# Patient Record
Sex: Female | Born: 1975 | Race: Black or African American | Hispanic: No | Marital: Single | Smoking: Current every day smoker
Health system: Southern US, Community
[De-identification: ages and names within clinical notes are randomized; demographics above are authoritative.]

## PROBLEM LIST (undated history)

## (undated) DIAGNOSIS — E7800 Pure hypercholesterolemia, unspecified: Secondary | ICD-10-CM

## (undated) DIAGNOSIS — IMO0002 Reserved for concepts with insufficient information to code with codable children: Secondary | ICD-10-CM

## (undated) DIAGNOSIS — E119 Type 2 diabetes mellitus without complications: Secondary | ICD-10-CM

## (undated) DIAGNOSIS — N8501 Benign endometrial hyperplasia: Secondary | ICD-10-CM

## (undated) DIAGNOSIS — N939 Abnormal uterine and vaginal bleeding, unspecified: Secondary | ICD-10-CM

## (undated) HISTORY — DX: Pure hypercholesterolemia, unspecified: E78.00

## (undated) HISTORY — DX: Type 2 diabetes mellitus without complications: E11.9

## (undated) HISTORY — DX: Benign endometrial hyperplasia: N85.01

## (undated) HISTORY — PX: APPENDECTOMY: SHX54

## (undated) HISTORY — DX: Reserved for concepts with insufficient information to code with codable children: IMO0002

## (undated) HISTORY — DX: Abnormal uterine and vaginal bleeding, unspecified: N93.9

## (undated) HISTORY — DX: Morbid (severe) obesity due to excess calories: E66.01

---
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
## 2002-09-22 ENCOUNTER — Emergency Department (HOSPITAL_COMMUNITY): Admission: EM | Admit: 2002-09-22 | Discharge: 2002-09-22 | Payer: Self-pay | Admitting: *Deleted

## 2002-09-22 ENCOUNTER — Encounter: Payer: Self-pay | Admitting: *Deleted

## 2005-07-05 ENCOUNTER — Ambulatory Visit (HOSPITAL_COMMUNITY): Admission: RE | Admit: 2005-07-05 | Discharge: 2005-07-05 | Payer: Self-pay | Admitting: *Deleted

## 2006-01-13 ENCOUNTER — Emergency Department (HOSPITAL_COMMUNITY): Admission: EM | Admit: 2006-01-13 | Discharge: 2006-01-14 | Payer: Self-pay | Admitting: Emergency Medicine

## 2008-09-03 HISTORY — PX: OTHER SURGICAL HISTORY: SHX169

## 2009-06-19 ENCOUNTER — Emergency Department (HOSPITAL_COMMUNITY): Admission: EM | Admit: 2009-06-19 | Discharge: 2009-06-20 | Payer: Self-pay | Admitting: Emergency Medicine

## 2010-09-26 ENCOUNTER — Encounter: Payer: Self-pay | Admitting: Obstetrics & Gynecology

## 2010-09-26 ENCOUNTER — Ambulatory Visit
Admission: RE | Admit: 2010-09-26 | Discharge: 2010-09-26 | Payer: Self-pay | Source: Home / Self Care | Attending: Obstetrics & Gynecology | Admitting: Obstetrics & Gynecology

## 2010-09-26 DIAGNOSIS — R87619 Unspecified abnormal cytological findings in specimens from cervix uteri: Secondary | ICD-10-CM

## 2010-09-26 DIAGNOSIS — IMO0002 Reserved for concepts with insufficient information to code with codable children: Secondary | ICD-10-CM

## 2010-09-26 HISTORY — DX: Reserved for concepts with insufficient information to code with codable children: IMO0002

## 2010-09-26 HISTORY — DX: Unspecified abnormal cytological findings in specimens from cervix uteri: R87.619

## 2010-09-26 LAB — CONVERTED CEMR LAB
HCT: 41.4 % (ref 36.0–46.0)
Hemoglobin: 13.5 g/dL (ref 12.0–15.0)
Hgb A1c MFr Bld: 6.3 % — ABNORMAL HIGH (ref <5.7)
MCHC: 32.6 g/dL (ref 30.0–36.0)
MCV: 81.5 fL (ref 78.0–100.0)
Platelets: 331 10*3/uL (ref 150–400)
Prolactin: 3.9 ng/mL
RBC: 5.08 M/uL (ref 3.87–5.11)
RDW: 13.4 % (ref 11.5–15.5)
TSH: 2.6 microintl units/mL (ref 0.350–4.500)
Testosterone: 55.36 ng/dL (ref 10–70)
WBC: 8 10*3/uL (ref 4.0–10.5)
hCG, Beta Chain, Quant, S: <2 milliintl units/mL

## 2010-09-27 NOTE — Assessment & Plan Note (Signed)
NAME:  Mariah [name], Mariah [name]            ACCOUNT NO.:  [id number]  MEDICAL RECORD NO.:  [id number]          PATIENT TYPE:  POB  LOCATION:  [facility] at [facility]         FACILITY:  [facility]  PHYSICIAN:  Jaynie Collins, MD     DATE OF BIRTH:  1976/01/29  DATE OF SERVICE:                                 CLINIC NOTE  REASON FOR VISIT:  Annual physical examination.  Mariah [name] is a 35 year old, gravida 0 who is here to establish gynecologic care and for annual examination.  The patient complains of irregular cycles and having spotting for the last few months.  MENSTRUAL HISTORY:  The patient has a menarche at age 35.  She does report that she was initially regular, but for many years she has had oligomenorrhea and actually was on oral contraceptive pills for less than a year.  Over the past few months, she has had a spotting all the time and she is really concerned about this.  She denies any associated abdominal pain, but does report some malodorous vaginal discharge which she attributes to having the continuous spotting.  She denies any other gynecologic symptoms.  PAST OBSTETRIC AND GYNECOLOGIC HISTORY:  The patient is a nulligravida. Her menstrual history is as above.  The patient uses condoms for contraception.  She has had normal Pap smear, the last one in 2010.  She denies any sexually transmitted diseases or any other gynecologic indications.  PAST MEDICAL HISTORY:  Morbid obesity and hypercholesterinemia.  PAST SURGICAL HISTORY:  Appendectomy, cystectomy in 1989 at [facility] and polyp removal in 2010, by Dr. Emelda Fear at [facility].  MEDICATIONS:  None.  ALLERGIES:  No known drug allergies.  SOCIAL HISTORY:  The patient lives with her family.  She smokes one-pack a day and has smoked for 13 years.  She denies any illicit drugs or alcohol use.  Denies any current or past history of sexual or physical abuse.  FAMILY HISTORY:  Remarkable for her grandmother who was diagnosed  with breast cancer before the age of 44.  No other cancer history in the family.  She does have an extensive family history of diabetes, heart disease and heart attacks.  Her mom and grandfather both had heart attacks.  REVIEW OF SYSTEMS:  The patient reports weakness in fingers and numbness, vaginal odor, vaginal itching, vaginal bleeding and pain with intercourse.  PHYSICAL EXAMINATION:  VITAL SIGNS:  Pulse is 92, blood pressure 126/101, rechecked it is 102/83, weight 290 pounds, height 5 feet 7 inches. GENERAL:  No apparent distress.  The patient is noticeably hirsute with hair all over her face and under her chin. HEENT:  Normocephalic, atraumatic. NECK:  Supple.  Normal thyroid. LUNGS:  Clear to auscultation bilaterally. HEART:  Regular rate and rhythm. BREASTS:  Symmetric in size, pendulous, soft, nontender, no abnormal masses, skin changes, nipple drainage or lymphadenopathy noted. ABDOMEN:  Obese.  No organomegaly palpated. EXTREMITIES:  No cyanosis, clubbing or edema.  Nontender. PELVIC:  Normal external female genitalia.  Pink and well rugated vagina.  The patient was unable to tolerate a speculum exam with a graves speculum and long Richardson Chiquito was used.  She does have a nulliparous cervix, some blood was seen in the vaginal vault.  No active bleeding. Pap smear sample was obtained.  On bimanual exam, the uterus or adnexa could not be palpated secondary to patient's habitus.  The patient was very uncomfortable during the pelvic examination.  ASSESSMENT AND PLAN:  The patient is a 35 year old, gravida 0 who is here for annual examination and evaluation of irregular menses.  She had a normal breast examination.  Pap smear is pending.  We will follow up with her results.  The patient is interested in sexually transmitted infection screening and so ancillary testing will be done from her Pap smear, but for a part of her sexually transmitted infection and also workup of  possible vaginitis and she will also have serum testing for human immunodeficiency virus, hepatitis B, hepatitis C and syphilis.  As for her irregular menses, different etiologies were discussed in detail with the patient including hormonal dysfunction, structural anomalies of her reproductive organ and also thickened endometrial stripe versus possible hyperplasia given the patient's obesity and increased estrone. As such, she will have laboratory evaluation in the form of CBC, TSH, prolactin, total testosterone, hemoglobin A1c and hCG, has one of the hormonal etiologies given her history of being hirsute can be that she has a polycystic ovarian syndrome diagnosis.  The patient is also going to have a pelvic ultrasound for further evaluation of her endometrial stripe and to rule out structural anomalies.  We will follow up on these results.  The patient is interested in being referred from primary care physician followup and so she will be referred after this encounter because of the concern about endometrial hyperplasia, especially in the setting of morbid obesity and also concerned about possible neoplasia. The patient was counseled regarding an endometrial biopsy.  She will get this done when she comes for her next visit and the patient was instructed to take ibuprofen before that biopsy to help with her discomfort during the procedure.  The patient was given written information about abnormal uterine bleeding and its management and we will follow up on all these tests and manage accordingly.  Bleeding precautions were reviewed and she was told to call or come back in if she has any further gynecologic concerns.          ______________________________ Jaynie Collins, MD    UA/MEDQ  D:  09/26/2010  T:  09/27/2010  Job:  [id number]

## 2010-10-17 ENCOUNTER — Ambulatory Visit: Payer: Self-pay | Admitting: Obstetrics and Gynecology

## 2010-10-18 ENCOUNTER — Ambulatory Visit: Payer: BC Managed Care – PPO | Admitting: Obstetrics & Gynecology

## 2010-12-28 ENCOUNTER — Emergency Department (HOSPITAL_COMMUNITY)
Admission: EM | Admit: 2010-12-28 | Discharge: 2010-12-28 | Disposition: A | Discharge status: Home / Self Care | Payer: BC Managed Care – PPO | Attending: Emergency Medicine | Admitting: Emergency Medicine

## 2010-12-28 DIAGNOSIS — R05 Cough: Secondary | ICD-10-CM | POA: Insufficient documentation

## 2010-12-28 DIAGNOSIS — R059 Cough, unspecified: Secondary | ICD-10-CM | POA: Insufficient documentation

## 2010-12-28 DIAGNOSIS — IMO0001 Reserved for inherently not codable concepts without codable children: Secondary | ICD-10-CM | POA: Insufficient documentation

## 2011-01-31 ENCOUNTER — Other Ambulatory Visit: Payer: Self-pay | Admitting: Obstetrics & Gynecology

## 2011-01-31 DIAGNOSIS — N926 Irregular menstruation, unspecified: Secondary | ICD-10-CM

## 2011-02-08 ENCOUNTER — Ambulatory Visit (HOSPITAL_COMMUNITY)
Admission: RE | Admit: 2011-02-08 | Discharge: 2011-02-08 | Disposition: A | Discharge status: Home / Self Care | Payer: BC Managed Care – PPO | Source: Ambulatory Visit | Attending: Obstetrics & Gynecology | Admitting: Obstetrics & Gynecology

## 2011-02-08 ENCOUNTER — Emergency Department (HOSPITAL_COMMUNITY)
Admission: EM | Admit: 2011-02-08 | Discharge: 2011-02-09 | Disposition: A | Discharge status: Home / Self Care | Payer: BC Managed Care – PPO | Attending: Emergency Medicine | Admitting: Emergency Medicine

## 2011-02-08 ENCOUNTER — Other Ambulatory Visit: Payer: Self-pay | Admitting: Obstetrics & Gynecology

## 2011-02-08 DIAGNOSIS — R05 Cough: Secondary | ICD-10-CM | POA: Insufficient documentation

## 2011-02-08 DIAGNOSIS — N926 Irregular menstruation, unspecified: Secondary | ICD-10-CM

## 2011-02-08 DIAGNOSIS — R059 Cough, unspecified: Secondary | ICD-10-CM | POA: Insufficient documentation

## 2011-02-08 DIAGNOSIS — N938 Other specified abnormal uterine and vaginal bleeding: Secondary | ICD-10-CM | POA: Insufficient documentation

## 2011-02-08 DIAGNOSIS — J45909 Unspecified asthma, uncomplicated: Secondary | ICD-10-CM | POA: Insufficient documentation

## 2011-02-08 DIAGNOSIS — N949 Unspecified condition associated with female genital organs and menstrual cycle: Secondary | ICD-10-CM | POA: Insufficient documentation

## 2011-02-09 ENCOUNTER — Emergency Department (HOSPITAL_COMMUNITY): Payer: BC Managed Care – PPO

## 2011-02-13 ENCOUNTER — Other Ambulatory Visit (INDEPENDENT_AMBULATORY_CARE_PROVIDER_SITE_OTHER): Payer: BC Managed Care – PPO | Admitting: Family Medicine

## 2011-02-13 DIAGNOSIS — N920 Excessive and frequent menstruation with regular cycle: Secondary | ICD-10-CM

## 2011-02-13 HISTORY — PX: ENDOMETRIAL BIOPSY: SHX622

## 2011-02-14 NOTE — Assessment & Plan Note (Signed)
NAME:  Mariah [name], Mariah [name] NO.:  [id number]  MEDICAL RECORD NO.:  [id number]           PATIENT TYPE:  LOCATION:  [facility] at [facility]           FACILITY:  PHYSICIAN:  Tinnie Gens, MD        DATE OF BIRTH:  March 13, 1976  DATE OF SERVICE:  02/13/2011                                 CLINIC NOTE  CHIEF COMPLAINT:  Followup.  HISTORY OF PRESENT ILLNESS:  The patient is a 35 year old, nulligravida, who was seen by Dr. Jaynie Collins on September 26, 2010, who was come back in for endometrial sampling for irregular bleeding and heavy periods.  She was also sent for pelvic sonogram, which is reviewed today.  Her ultrasound shows her to have a normal-sized uterus at 7.8 x 3.8 x 4.7 with an endometrial stripe that this is upper limits of normal containing cystic areas.  The right and left ovary were both normal.  It was felt that these could likely represent a polyp and sonohysterogram was recommended.  Additionally, the patient does desire further fertility.  Although, she is not showing any pregnant right now.  PHYSICAL EXAMINATION:  VITAL SIGNS:  Today, vitals are as noted in the chart. GENERAL:  She is a morbidly obese female.  Weight 294. ABDOMEN:  Soft, nontender, nondistended.  PROCEDURE:  Speculum was placed inside the vagina.  The cervix was visualized.  Cleaned with Betadine x2.  Grasped anteriorly with single- tooth tenaculum.  Sounds to approximately 7 cm.  Endometrial sampling is done without difficulty and the Pipelle was passed x2.  The patient tolerated the procedure well.  IMPRESSION:  Abnormal uterine bleeding in a morbidly obese female with increased risk of hyperplasia and abnormal ultrasound.  PLAN:  Follow-up biopsy results.  We will call the patient with these and they come in which should be in about 2 weeks.  Additionally, I have given her Provera 10 mg daily for 5 days once monthly to improve cycle control, so I think she is a good candidate for OCs  as she is almost 36 and smoker.          ______________________________ Tinnie Gens, MD    TP/MEDQ  D:  02/13/2011  T:  02/14/2011  Job:  [id number]

## 2011-03-15 ENCOUNTER — Encounter: Payer: Self-pay | Admitting: Obstetrics & Gynecology

## 2011-03-15 ENCOUNTER — Ambulatory Visit (INDEPENDENT_AMBULATORY_CARE_PROVIDER_SITE_OTHER): Payer: BC Managed Care – PPO | Admitting: Obstetrics & Gynecology

## 2011-03-15 VITALS — BP 106/67 | HR 86 | Ht 67.0 in | Wt 280.0 lb

## 2011-03-15 DIAGNOSIS — N926 Irregular menstruation, unspecified: Secondary | ICD-10-CM

## 2011-03-15 DIAGNOSIS — N939 Abnormal uterine and vaginal bleeding, unspecified: Secondary | ICD-10-CM

## 2011-03-15 DIAGNOSIS — Z8742 Personal history of other diseases of the female genital tract: Secondary | ICD-10-CM | POA: Insufficient documentation

## 2011-03-15 DIAGNOSIS — N8501 Benign endometrial hyperplasia: Secondary | ICD-10-CM

## 2011-03-15 MED ORDER — MEDROXYPROGESTERONE ACETATE 10 MG PO TABS: 10.0000 mg | ORAL_TABLET | Freq: Every day | ORAL | Status: DC

## 2011-03-15 NOTE — Progress Notes (Signed)
  Subjective:     Mariah [name] is an 35 y.o. G0  woman who presents for irregular menses. She was seen by Dr. Shawnie Pons on 02/13/11 and underwent an endometrial biopsy that showed simple hyperplasia without atypia.  She is here today to review results.   Objective:    BP 106/67  Pulse 86  Ht 5\' 7"  (1.702 m)  Wt 280 lb (127.007 kg)  BMI 43.85 kg/m2  LMP 02/10/2011 Exam deferred this visit.    Assessment and Plan:  Results reviewed with patient.  Recommended D&C for more complete endometrial sampling and hysteroscopy. Risks of surgery including bleeding, infection, injury to uterus or surrounding organs, need for additional procedures, possibility of intrauterine scarring which may impair future fertility and other postoperative/anesthesia complications were explained to patient.  She was told to expect contact from surgical scheduler regarding time and date of surgery.  She was also given an prescription for Provera 10 mg po daily for presumptive treatment of simple endometrial hyperplasia.  Preoperative handout explaining details about the D&C was given to the patient.

## 2011-04-10 ENCOUNTER — Encounter (HOSPITAL_COMMUNITY): Payer: Self-pay

## 2011-04-10 ENCOUNTER — Encounter (HOSPITAL_COMMUNITY)
Admission: RE | Admit: 2011-04-10 | Discharge: 2011-04-10 | Disposition: A | Discharge status: Home / Self Care | Payer: BC Managed Care – PPO | Source: Ambulatory Visit | Attending: Obstetrics & Gynecology | Admitting: Obstetrics & Gynecology

## 2011-04-10 LAB — CBC
MCH: 26.8 pg (ref 26.0–34.0)
MCV: 81.9 fL (ref 78.0–100.0)
Platelets: 272 10*3/uL (ref 150–400)
RDW: 13.9 % (ref 11.5–15.5)

## 2011-04-10 NOTE — Patient Instructions (Addendum)
21 Mariah [name]  04/10/2011   Your procedure is scheduled on:  04/16/11 Report to [facility] at 730 AM.  Call this number if you have problems the morning of surgery: [phone]   Remember:   Do not eat food:After Midnight.  Do not drink clear liquids: After Midnight.  Take these medicines the morning of surgery with A SIP OF WATER: NA   Do not wear jewelry, make-up or nail polish.  Do not wear lotions, powders, or perfumes. You may wear deodorant.  Do not shave 48 hours prior to surgery.  Do not bring valuables to the hospital.  Contacts, dentures or bridgework may not be worn into surgery.  Leave suitcase in the car. After surgery it may be brought to your room.  For patients admitted to the hospital, checkout time is 11:00 AM the day of discharge.   Patients discharged the day of surgery will not be allowed to drive home.  Name and phone number of your driver: mother   Mariah [name]   [phone]  Special Instructions: use CHG wash per written instruction sheet   Please read over the following fact sheets that you were given: NA

## 2011-04-16 ENCOUNTER — Encounter (HOSPITAL_COMMUNITY): Payer: Self-pay | Admitting: Registered Nurse

## 2011-04-16 ENCOUNTER — Other Ambulatory Visit: Payer: Self-pay | Admitting: Obstetrics & Gynecology

## 2011-04-16 ENCOUNTER — Encounter (HOSPITAL_COMMUNITY)
Admission: RE | Disposition: A | Discharge status: Home / Self Care | Payer: Self-pay | Source: Ambulatory Visit | Attending: Obstetrics & Gynecology

## 2011-04-16 ENCOUNTER — Ambulatory Visit (HOSPITAL_COMMUNITY): Payer: BC Managed Care – PPO | Admitting: Registered Nurse

## 2011-04-16 ENCOUNTER — Encounter (HOSPITAL_COMMUNITY): Payer: Self-pay | Admitting: Obstetrics & Gynecology

## 2011-04-16 ENCOUNTER — Ambulatory Visit (HOSPITAL_COMMUNITY)
Admission: RE | Admit: 2011-04-16 | Discharge: 2011-04-16 | Disposition: A | Discharge status: Home / Self Care | Payer: BC Managed Care – PPO | Source: Ambulatory Visit | Attending: Obstetrics & Gynecology | Admitting: Obstetrics & Gynecology

## 2011-04-16 DIAGNOSIS — N915 Oligomenorrhea, unspecified: Secondary | ICD-10-CM | POA: Insufficient documentation

## 2011-04-16 DIAGNOSIS — Z01812 Encounter for preprocedural laboratory examination: Secondary | ICD-10-CM | POA: Insufficient documentation

## 2011-04-16 DIAGNOSIS — N8501 Benign endometrial hyperplasia: Secondary | ICD-10-CM

## 2011-04-16 DIAGNOSIS — Z01818 Encounter for other preprocedural examination: Secondary | ICD-10-CM | POA: Insufficient documentation

## 2011-04-16 HISTORY — PX: HYSTEROSCOPY WITH D & C: SHX1775

## 2011-04-16 SURGERY — DILATATION AND CURETTAGE /HYSTEROSCOPY
Anesthesia: Choice

## 2011-04-16 MED ORDER — MIDAZOLAM HCL 5 MG/5ML IJ SOLN
INTRAMUSCULAR | Status: DC | PRN
  Administered 2011-04-16: 2 mg via INTRAVENOUS

## 2011-04-16 MED ORDER — MIDAZOLAM HCL 2 MG/2ML IJ SOLN
INTRAMUSCULAR | Status: AC
  Filled 2011-04-16: qty 2

## 2011-04-16 MED ORDER — FAMOTIDINE 20 MG PO TABS: 20.0000 mg | ORAL_TABLET | Freq: Once | ORAL | Status: DC | PRN

## 2011-04-16 MED ORDER — PANTOPRAZOLE SODIUM 40 MG PO TBEC: 40.0000 mg | DELAYED_RELEASE_TABLET | Freq: Once | ORAL | Status: DC | PRN

## 2011-04-16 MED ORDER — DOCUSATE SODIUM 100 MG PO CAPS: 100.0000 mg | ORAL_CAPSULE | Freq: Two times a day (BID) | ORAL | Status: AC | PRN

## 2011-04-16 MED ORDER — BUPIVACAINE HCL (PF) 0.25 % IJ SOLN
INTRAMUSCULAR | Status: DC | PRN
  Administered 2011-04-16: 20 mL

## 2011-04-16 MED ORDER — ONDANSETRON HCL 4 MG/2ML IJ SOLN
INTRAMUSCULAR | Status: DC | PRN
  Administered 2011-04-16: 4 mg via INTRAVENOUS

## 2011-04-16 MED ORDER — IBUPROFEN 600 MG PO TABS: 600.0000 mg | ORAL_TABLET | Freq: Four times a day (QID) | ORAL | Status: AC | PRN

## 2011-04-16 MED ORDER — METOCLOPRAMIDE HCL 10 MG PO TABS: 10.0000 mg | ORAL_TABLET | Freq: Once | ORAL | Status: DC | PRN

## 2011-04-16 MED ORDER — GLYCINE 1.5 % IR SOLN
Status: DC | PRN
  Administered 2011-04-16: 3000 mL

## 2011-04-16 MED ORDER — CITRIC ACID-SODIUM CITRATE 334-500 MG/5ML PO SOLN: 30.0000 mL | Freq: Once | ORAL | Status: DC | PRN

## 2011-04-16 MED ORDER — OXYCODONE-ACETAMINOPHEN 5-325 MG PO TABS: 1.0000 | ORAL_TABLET | ORAL | Status: AC | PRN

## 2011-04-16 MED ORDER — SCOPOLAMINE 1 MG/3DAYS TD PT72: 1.0000 | MEDICATED_PATCH | Freq: Once | TRANSDERMAL | Status: DC | PRN

## 2011-04-16 MED ORDER — FENTANYL CITRATE 0.05 MG/ML IJ SOLN
INTRAMUSCULAR | Status: DC | PRN
  Administered 2011-04-16: 100 ug via INTRAVENOUS

## 2011-04-16 MED ORDER — LACTATED RINGERS IV SOLN
INTRAVENOUS | Status: DC | PRN
  Administered 2011-04-16: 09:00:00 via INTRAVENOUS

## 2011-04-16 MED ORDER — FENTANYL CITRATE 0.05 MG/ML IJ SOLN
INTRAMUSCULAR | Status: AC
  Filled 2011-04-16: qty 2

## 2011-04-16 MED ORDER — KETOROLAC TROMETHAMINE 30 MG/ML IJ SOLN: 15.0000 mg | Freq: Once | INTRAMUSCULAR | Status: DC | PRN

## 2011-04-16 MED ORDER — LIDOCAINE HCL (CARDIAC) 20 MG/ML IV SOLN
INTRAVENOUS | Status: AC
  Filled 2011-04-16: qty 5

## 2011-04-16 MED ORDER — LACTATED RINGERS IV SOLN
INTRAVENOUS | Status: DC
  Administered 2011-04-16: 20 mL/h via INTRAVENOUS

## 2011-04-16 MED ORDER — PROMETHAZINE HCL 25 MG/ML IJ SOLN: 6.2500 mg | INTRAMUSCULAR | Status: DC | PRN

## 2011-04-16 MED ORDER — PROPOFOL 10 MG/ML IV EMUL
INTRAVENOUS | Status: DC | PRN
  Administered 2011-04-16: 300 mg via INTRAVENOUS

## 2011-04-16 MED ORDER — FENTANYL CITRATE 0.05 MG/ML IJ SOLN
25.0000 ug | INTRAMUSCULAR | Status: DC | PRN
  Administered 2011-04-16: 50 ug via INTRAVENOUS

## 2011-04-16 MED ORDER — KETOROLAC TROMETHAMINE 30 MG/ML IJ SOLN
INTRAMUSCULAR | Status: AC
  Filled 2011-04-16: qty 1

## 2011-04-16 MED ORDER — LIDOCAINE HCL (CARDIAC) 20 MG/ML IV SOLN
INTRAVENOUS | Status: DC | PRN
  Administered 2011-04-16: 50 mg via INTRAVENOUS

## 2011-04-16 MED ORDER — ONDANSETRON HCL 4 MG/2ML IJ SOLN
INTRAMUSCULAR | Status: AC
  Filled 2011-04-16: qty 2

## 2011-04-16 MED ORDER — FENTANYL CITRATE 0.05 MG/ML IJ SOLN
INTRAMUSCULAR | Status: AC
  Administered 2011-04-16: 50 ug via INTRAVENOUS
  Filled 2011-04-16: qty 2

## 2011-04-16 MED ORDER — LACTATED RINGERS IV SOLN
INTRAVENOUS | Status: DC
  Administered 2011-04-16: 09:00:00 via INTRAVENOUS

## 2011-04-16 MED ORDER — ACETAMINOPHEN 325 MG PO TABS: 325.0000 mg | ORAL_TABLET | ORAL | Status: DC | PRN

## 2011-04-16 MED ORDER — ALBUTEROL SULFATE HFA 108 (90 BASE) MCG/ACT IN AERS
2.0000 | INHALATION_SPRAY | RESPIRATORY_TRACT | Status: DC
  Administered 2011-04-16: 2 via RESPIRATORY_TRACT

## 2011-04-16 MED ORDER — KETOROLAC TROMETHAMINE 30 MG/ML IJ SOLN
INTRAMUSCULAR | Status: DC | PRN
  Administered 2011-04-16: 30 mg via INTRAVENOUS

## 2011-04-16 MED ORDER — PROPOFOL 10 MG/ML IV EMUL
INTRAVENOUS | Status: AC
  Filled 2011-04-16: qty 40

## 2011-04-16 SURGICAL SUPPLY — 19 items
CANISTER SUCTION 2500CC (MISCELLANEOUS) ×2 IMPLANT
CATH ROBINSON RED A/P 16FR (CATHETERS) ×2 IMPLANT
CLOTH BEACON ORANGE TIMEOUT ST (SAFETY) ×2 IMPLANT
CONTAINER PREFILL 10% NBF 60ML (FORM) ×4 IMPLANT
CORD ACTIVE DISPOSABLE (ELECTRODE)
CORD ELECTRO ACTIVE DISP (ELECTRODE) IMPLANT
DRAPE PROXIMA HALF (DRAPES) ×2 IMPLANT
DRAPE UTILITY XL STRL (DRAPES) ×2 IMPLANT
ELECT LOOP GYNE PRO 24FR (CUTTING LOOP)
ELECT REM PT RETURN 9FT ADLT (ELECTROSURGICAL)
ELECT VAPORTRODE GRVD BAR (ELECTRODE) IMPLANT
ELECTRODE LOOP GYNE PRO 24FR (CUTTING LOOP) IMPLANT
ELECTRODE REM PT RTRN 9FT ADLT (ELECTROSURGICAL) IMPLANT
GLOVE BIO SURGEON STRL SZ7 (GLOVE) ×2 IMPLANT
GOWN PREVENTION PLUS LG XLONG (DISPOSABLE) ×4 IMPLANT
GOWN STRL REIN XL XLG (GOWN DISPOSABLE) ×2 IMPLANT
PACK HYSTEROSCOPY LF (CUSTOM PROCEDURE TRAY) ×2 IMPLANT
TOWEL OR 17X24 6PK STRL BLUE (TOWEL DISPOSABLE) ×4 IMPLANT
WATER STERILE IRR 1000ML POUR (IV SOLUTION) ×2 IMPLANT

## 2011-04-16 NOTE — Transfer of Care (Signed)
Immediate Anesthesia Transfer of Care Note  Patient: Mariah [name]  Procedure(s) Performed:  DILATATION AND CURETTAGE (D&C) /HYSTEROSCOPY - Diagnostic  Patient Location: PACU  Anesthesia Type: General  Level of Consciousness: awake, alert  and sedated  Airway & Oxygen Therapy: Patient Spontanous Breathing and Patient connected to nasal cannula oxygen  Post-op Assessment: Report given to PACU RN and Post -op Vital signs reviewed and stable  Post vital signs: Reviewed and stable  Complications: No apparent anesthesia complications

## 2011-04-16 NOTE — Op Note (Signed)
PREOPERATIVE DIAGNOSIS:  Simple endometrial hyperplasia without atypia, diagnosed on endometrial biopsy POSTOPERATIVE DIAGNOSIS: The same PROCEDURE: Hysteroscopy, Dilation and Curettage. SURGEON:  Dr. Jaynie Collins  INDICATIONS: 35 y.o. G0 with recent diagnosis of simple endometrial hyperplasia without atypia, diagnosed on endometrial biopsy. She was counseled regarding the need for this surgery for further evaluation; patient is already being treated with Provera.  Risks of surgery were discussed with the patient including but not limited to: bleeding which may require transfusion; infection which may require antibiotics; injury to uterus or surrounding organs; need for additional procedures including laparotomy or laparoscopy; intrauterine scarring which may impair future fertility and other postoperative/anesthesia complications. Written informed consent was obtained.    FINDINGS:  A 8 week size anteverted uterus.  Diffuse proliferative endometrium with polypoid fragments.  Ostia not able to be visualized.  ANESTHESIA:   General, paracervical block. INTRAVENOUS FLUIDS: 600 ml of LR FLUID DEFICITS:  10 ml of Glycine ESTIMATED BLOOD LOSS:  50 ml SPECIMENS: Endometrial curettings sent to pathology COMPLICATIONS:  None immediate.  PROCEDURE DETAILS:  The patient received intravenous antibiotics while in the preoperative area.  She was then taken to the operating room where general anesthesia was administered and was found to be adequate.  After an adequate timeout was performed, she was placed in the dorsal lithotomy position and examined; then prepped and draped in the sterile manner.   Her bladder was catheterized for an unmeasured amount of clear, yellow urine. A speculum was then placed in the patient's vagina and a single tooth tenaculum was applied to the anterior lip of the cervix.  A paracervical block using 20 ml of 0.25% Marcaine was administered.  The cervix was sounded to 8 cm and dilated  manually with metal dilators to accommodate the 5 mm diagnostic hysteroscope.  Once the cervix was dilated, the hysteroscope was inserted under direct visualization using glycine as a suspension medium.  The uterine cavity was carefully examined; diffusely proliferative and polypoid endometrium was noted.   After further careful visualization of the uterine cavity, the hysteroscope was removed under direct visualization.  A sharp curettage was then performed to obtain a moderate amount of endometrial curettings.  The tenaculum was removed from the anterior lip of the cervix, and the vaginal speculum was removed after noting good hemostasis.  The patient tolerated the procedure well and was taken to the recovery area awake, extubated and in stable condition.  Maurio Baize A 04/16/2011 9:44 AM

## 2011-04-16 NOTE — Anesthesia Postprocedure Evaluation (Signed)
Anesthesia Post Note  Patient: Mariah [name]  Procedure(s) Performed:  DILATATION AND CURETTAGE (D&C) /HYSTEROSCOPY - Diagnostic  Anesthesia type: GA  Patient location: PACU  Post pain: Pain level controlled  Post assessment: Post-op Vital signs reviewed  Last Vitals:  Filed Vitals:   04/16/11 1100  BP: 101/64  Pulse: 64  Temp:   Resp: 15    Post vital signs: Reviewed  Level of consciousness: sedated  Complications: No apparent anesthesia complications

## 2011-04-16 NOTE — Anesthesia Preprocedure Evaluation (Signed)
Anesthesia Evaluation  Name, MR# and DOB Patient awake  General Assessment Comment  Reviewed: Allergy & Precautions, H&P , Patient's Chart, lab work & pertinent test results, reviewed documented beta blocker date and time   History of Anesthesia Complications Negative for: history of anesthetic complications  Airway Mallampati: IV TM Distance: >3 FB Neck ROM: full    Dental No notable dental hx.    Pulmonary  asthma  clear to auscultation  pulmonary exam normalPulmonary Exam Normal breath sounds clear to auscultation none    Cardiovascular Exercise Tolerance: Good regular Normal    Neuro/Psych Negative Neurological ROS  Negative Psych ROS  GI/Hepatic/Renal negative GI ROS, negative Liver ROS, and negative Renal ROS (+)       Endo/Other  Negative Endocrine ROS (+)   Morbid obesity  Abdominal   Musculoskeletal   Hematology negative hematology ROS (+)   Peds  Reproductive/Obstetrics negative OB ROS    Anesthesia Other Findings             Anesthesia Physical Anesthesia Plan  ASA: III  Anesthesia Plan: General   Post-op Pain Management:    Induction:   Airway Management Planned:   Additional Equipment:   Intra-op Plan:   Post-operative Plan:   Informed Consent: I have reviewed the patients History and Physical, chart, labs and discussed the procedure including the risks, benefits and alternatives for the proposed anesthesia with the patient or authorized representative who has indicated his/her understanding and acceptance.   Dental Advisory Given  Plan Discussed with: CRNA and Surgeon  Anesthesia Plan Comments:         Anesthesia Quick Evaluation

## 2011-04-16 NOTE — H&P (Signed)
Reason for surgery: Simple endometrial hyperplasia without atypia, diagnosed on endometrial biopsy Proposed surgery: D&C, hysteroscopy  Mariah Mariah [name] is Mariah [name] 35 year old, gravida 0 who is here for scheduled surgery for further evaluation of simple endometrial hyperplasia without atypia, diagnosed on endometrial biopsy.  Patient is being treated with provera 10 mg po daily. MENSTRUAL HISTORY: PAST OBSTETRIC AND GYNECOLOGIC HISTORY: The patient is Mariah [name] nulligravida. The patient has Mariah [name] menarche at age 41.  Recently diagnosed with simple endometrial hyperplasia without atypia, diagnosed on endometrial biopsy.  She does report that she was initially regular, but for many years she has had oligomenorrhea and actually was on oral contraceptive pills for less than Mariah [name] year. Over the past few months, she has had Mariah [name] spotting all the time and she is really concerned about this. She denies any associated  abdominal pain, but does report some malodorous vaginal discharge which she attributes to having the continuous spotting. She denies any other gynecologic symptoms. She uses condoms for contraception. She has had normal Pap smears, the last one in 2012. She denies any sexually transmitted diseases or any other gynecologic indications.  PAST MEDICAL HISTORY: Morbid obesity, hypercholesterolemia, asthma, GERD PAST SURGICAL HISTORY: Appendectomy, cystectomy in 1989 at [facility] and polyp removal in 2010 by Dr. Emelda Fear at [facility].  MEDICATIONS: Provera 10 mg po qd ALLERGIES: No known drug allergies.  SOCIAL HISTORY: The patient lives with her family. She smokes one-pack Mariah [name] day and has smoked for 13 years. She denies any illicit drugs or alcohol use. Denies any current or past history of sexual or physical abuse.  FAMILY HISTORY: Remarkable for her grandmother who was diagnosed with breast cancer before the age of 80. No other cancer history in the family. She does have an extensive family history of diabetes, heart disease  and heart attacks. Her mom and grandfather both had heart attacks.   PHYSICAL EXAMINATION VITAL SIGNS: T 98.6  BP 118/68, P 84, R 16, SpO2 100% GENERAL: No apparent distress. The patient is noticeably hirsute with hair all over her face and under her chin.  LUNGS: Clear to auscultation bilaterally.  HEART: Regular rate and rhythm.  ABDOMEN: Obese. No organomegaly palpated.  PELVIC: Deferred EXTREMITIES: No cyanosis, clubbing or edema. Nontender.   LABS: Urine pregnancy negative  ASSESSMENT AND PLAN: The patient is Mariah [name] 35 year old, gravida 0 with simple endometrial hyperplasia without atypia, diagnosed on endometrial biopsy.   Recommended D&C for more complete endometrial sampling and hysteroscopy. Risks of surgery including bleeding, infection, injury to uterus or surrounding organs, need for additional procedures, possibility of intrauterine scarring which may impair future fertility and other postoperative/anesthesia complications were explained to patient. She was also given an prescription for Provera 10 mg po daily for presumptive treatment of simple endometrial hyperplasia. To OR when ready.  Mariah Mariah [name],Mariah Mariah [name] 04/16/2011 8:11 AM

## 2011-05-03 ENCOUNTER — Telehealth: Payer: Self-pay

## 2011-05-03 NOTE — Telephone Encounter (Signed)
PATIENT CALLED WOULD LIKE RESULTS REGARDING HER DNC. SHE WAS TOLD THEY WOULD BE IN BY NOW. PLEASE CALL HER BACK TODAY. THANKS!

## 2011-05-03 NOTE — Telephone Encounter (Signed)
Unable to reach patient, but left message that all was ok.  She will call back if she has any further questions.

## 2011-05-14 ENCOUNTER — Encounter (HOSPITAL_COMMUNITY): Payer: Self-pay | Admitting: Obstetrics & Gynecology

## 2011-05-16 ENCOUNTER — Encounter: Payer: BC Managed Care – PPO | Admitting: Obstetrics & Gynecology

## 2012-01-23 ENCOUNTER — Ambulatory Visit: Payer: BC Managed Care – PPO | Admitting: Obstetrics & Gynecology

## 2012-02-06 ENCOUNTER — Ambulatory Visit: Payer: BC Managed Care – PPO | Admitting: Obstetrics & Gynecology

## 2012-05-18 IMAGING — US US TRANSVAGINAL NON-OB
1 series · 14 of 25 positions shown · non-contrast
Comparison: July 05, 2005

CLINICAL DATA: An irregular bleeding



[Series 1: us pelvis complete · 14 of 69 slices shown]
[im 1/69]
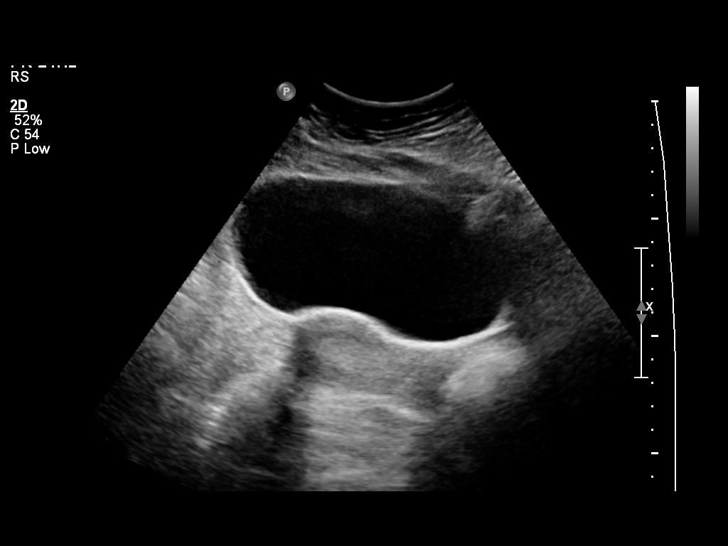
[im 6/69]
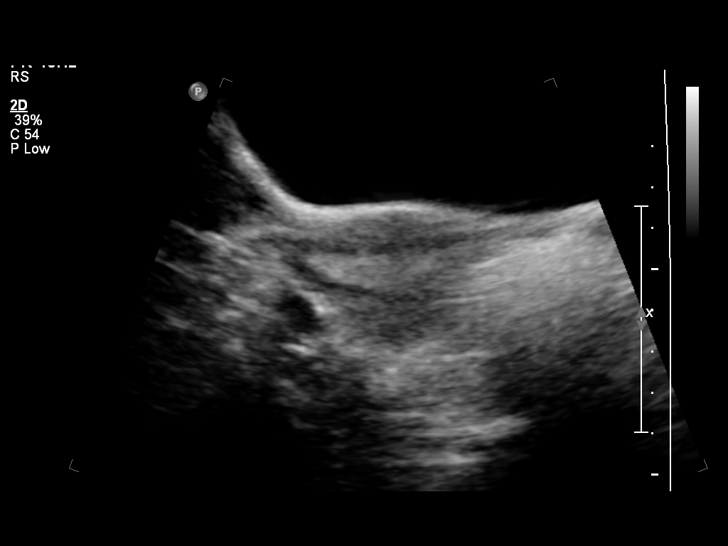
[im 12/69]
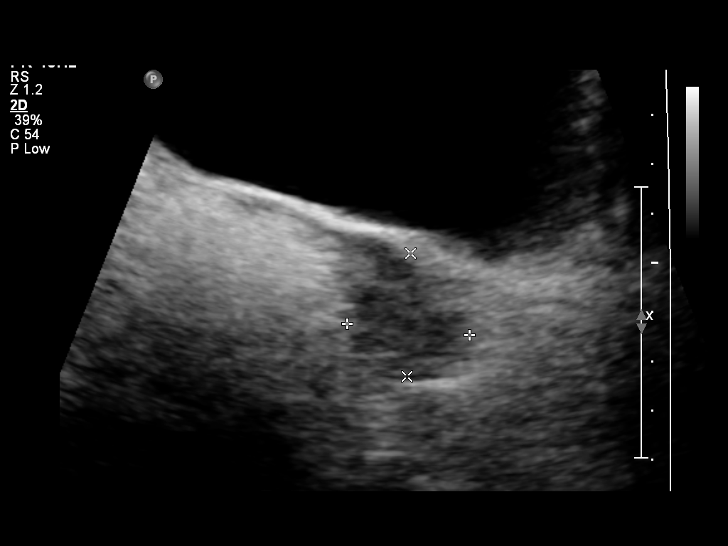
[im 18/69]
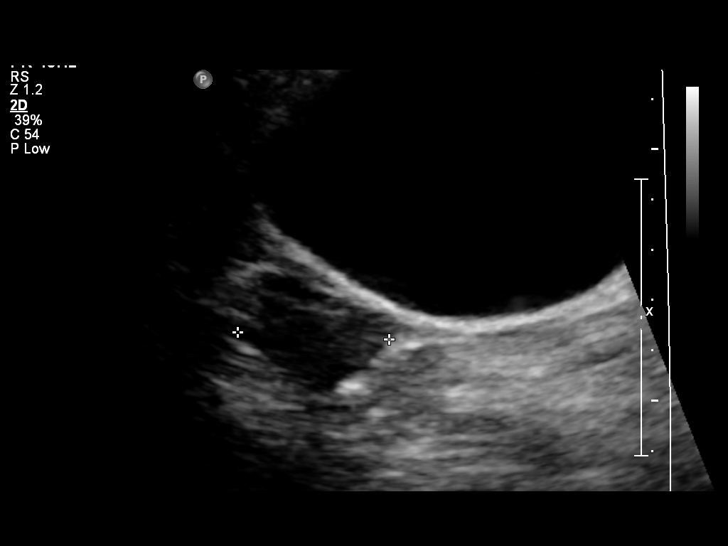
[im 23/69]
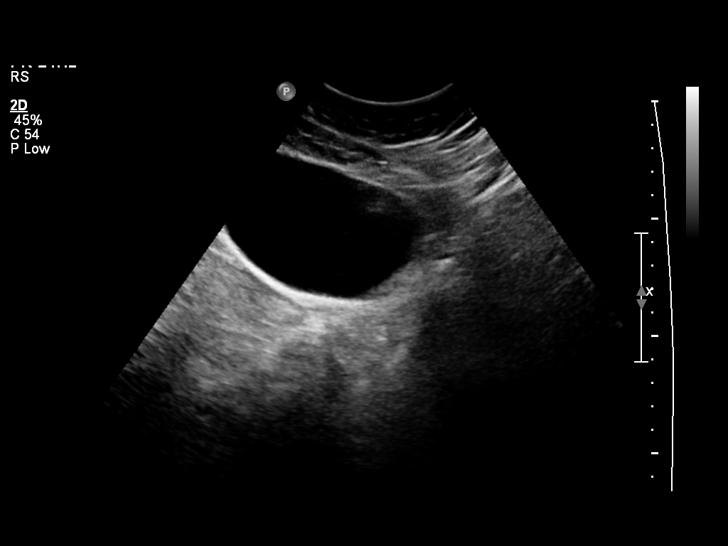
[im 26/69]
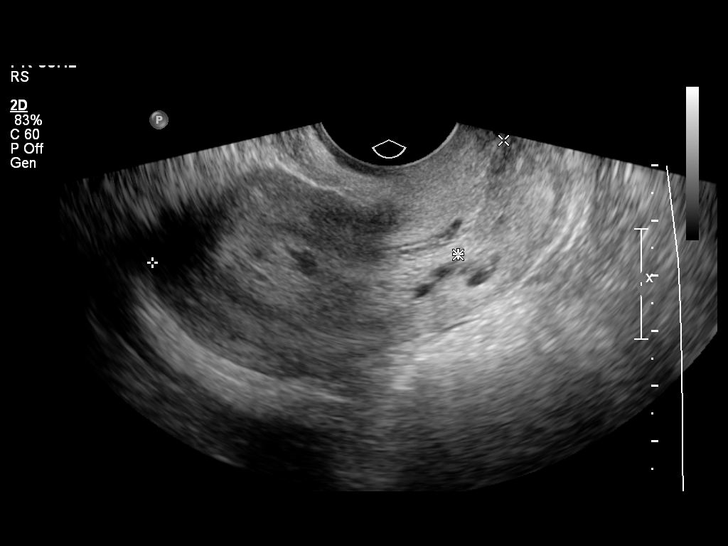
[im 32/69]
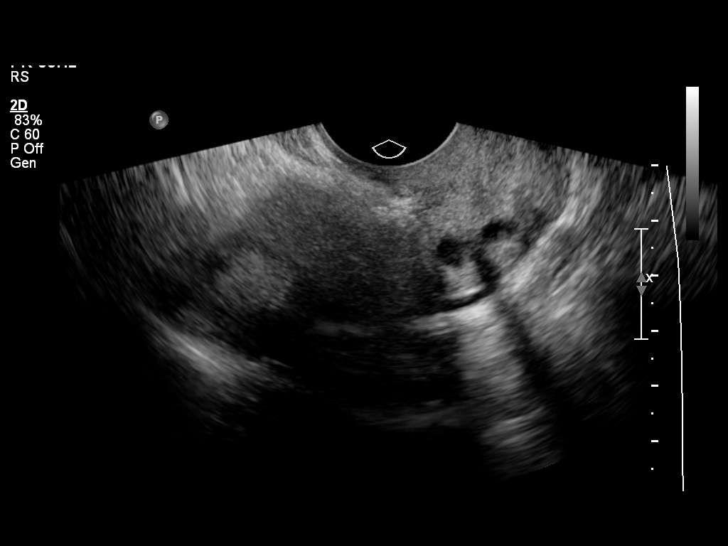
[im 37/69]
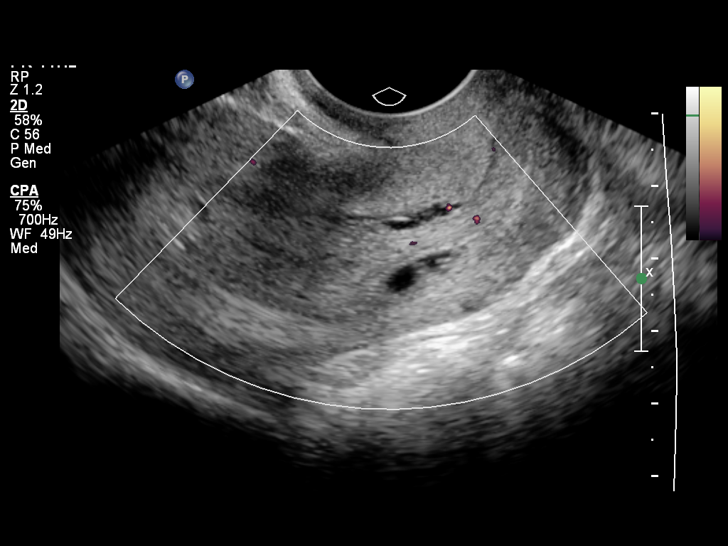
[im 43/69]
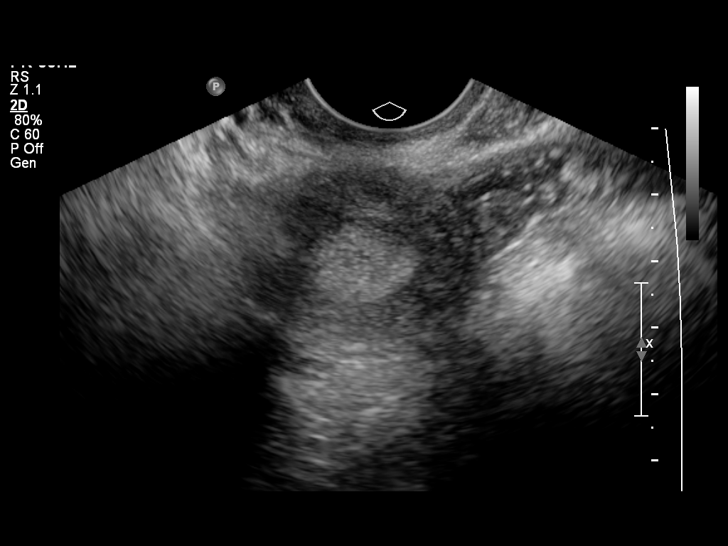
[im 46/69]
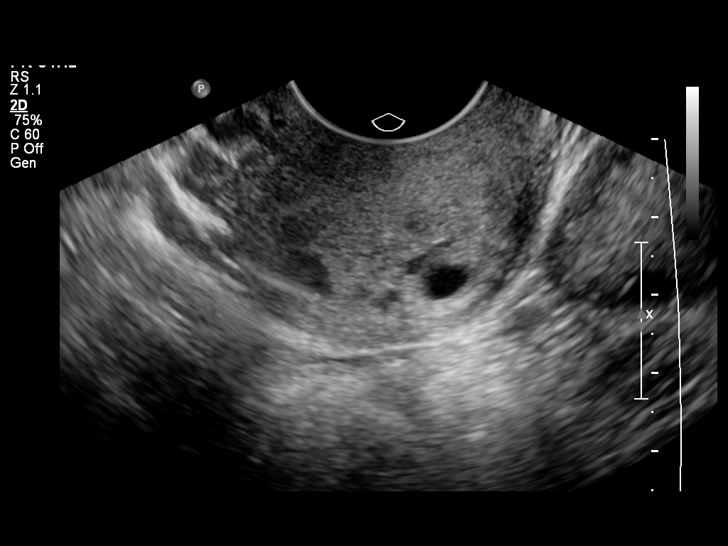
[im 52/69]
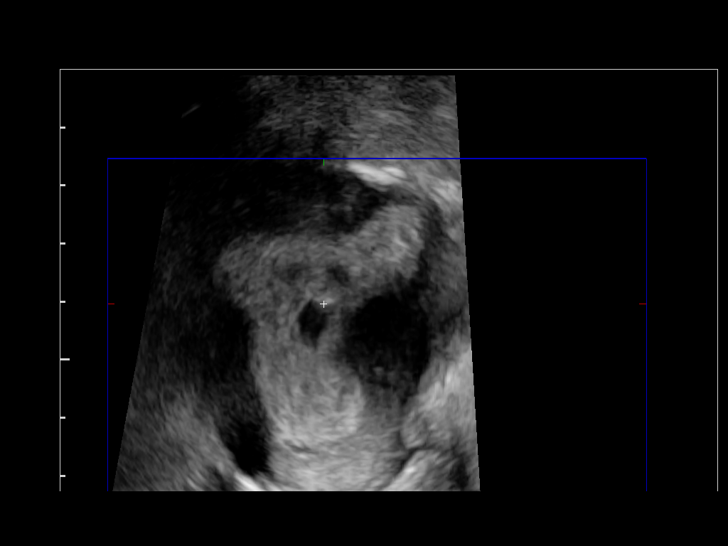
[im 57/69]
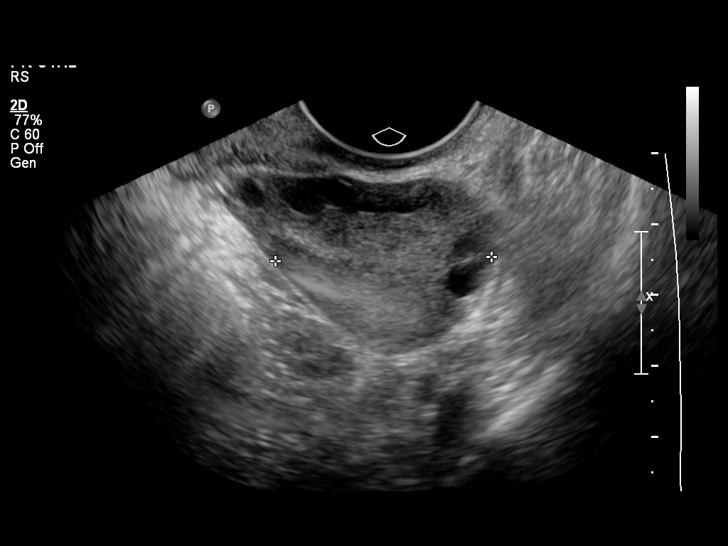
[im 63/69]
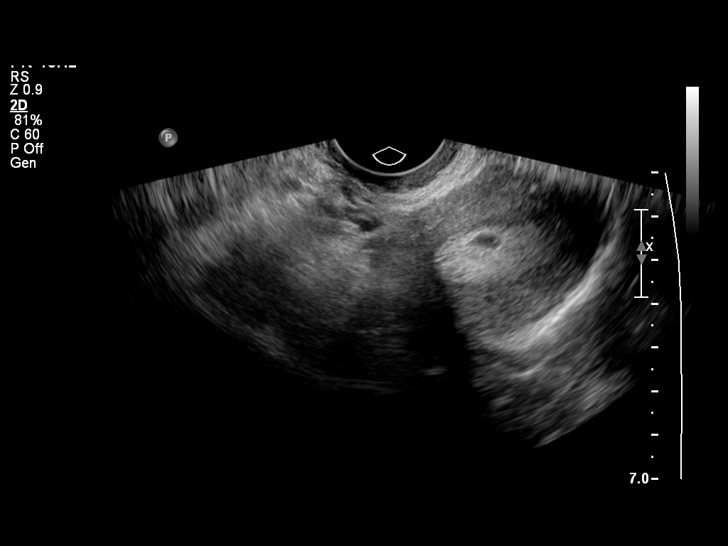
[im 69/69]
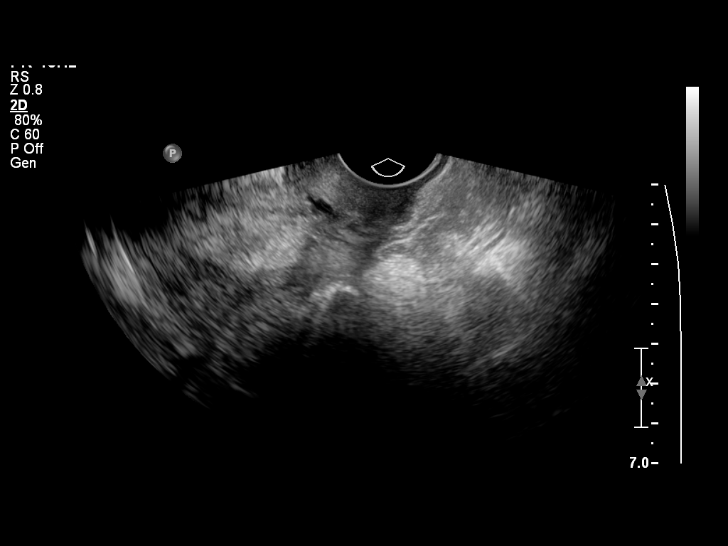

[14 of 25 positions shown; findings below may reference images not displayed]

FINDINGS: The uterus is normal in size and echotexture, measuring
7.8 x 3.8 x 4.7 cm.  The endometrial stripe is upper limits of
normal in width but heterogeneous, containing cystic areas.

Both ovaries have a normal size and appearance.  The right ovary
measures 4.2 x 2.7 x 3.0 cm, and the left ovary measures 3.2 x
x 3.1 cm.  There are no adnexal masses or free pelvic fluid.
IMPRESSION: Heterogeneous endometrium containing cystic spaces.  Endometrial
polyps can have this appearance.  Further evaluation with
Histerosonogram could be performed for better characterization, if
desired.

## 2012-05-19 IMAGING — CR DG CHEST 2V
2 series · 2 of 2 positions shown · non-contrast
Comparison: None.

CLINICAL DATA: Cough for 2 days.  Low grade fever.

CHEST - 2 VIEW

[view not recorded (1 of 2)]
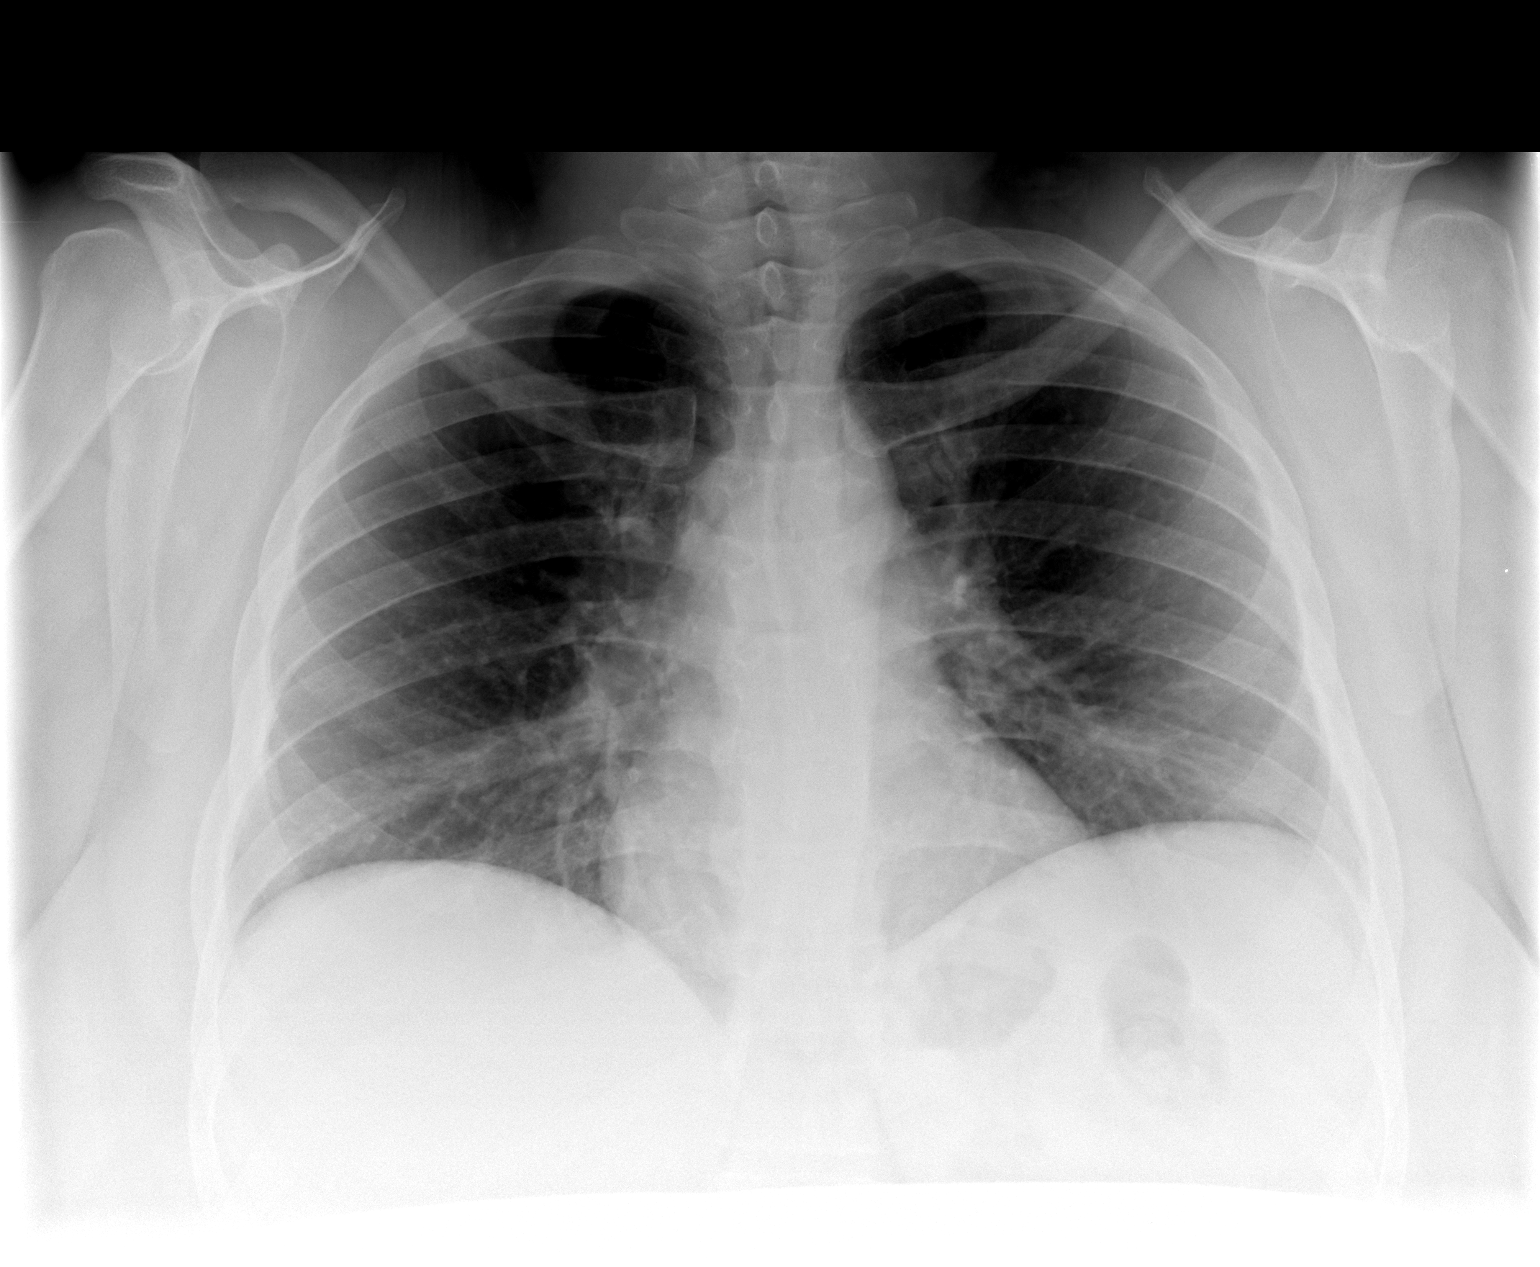

[view not recorded (2 of 2)]
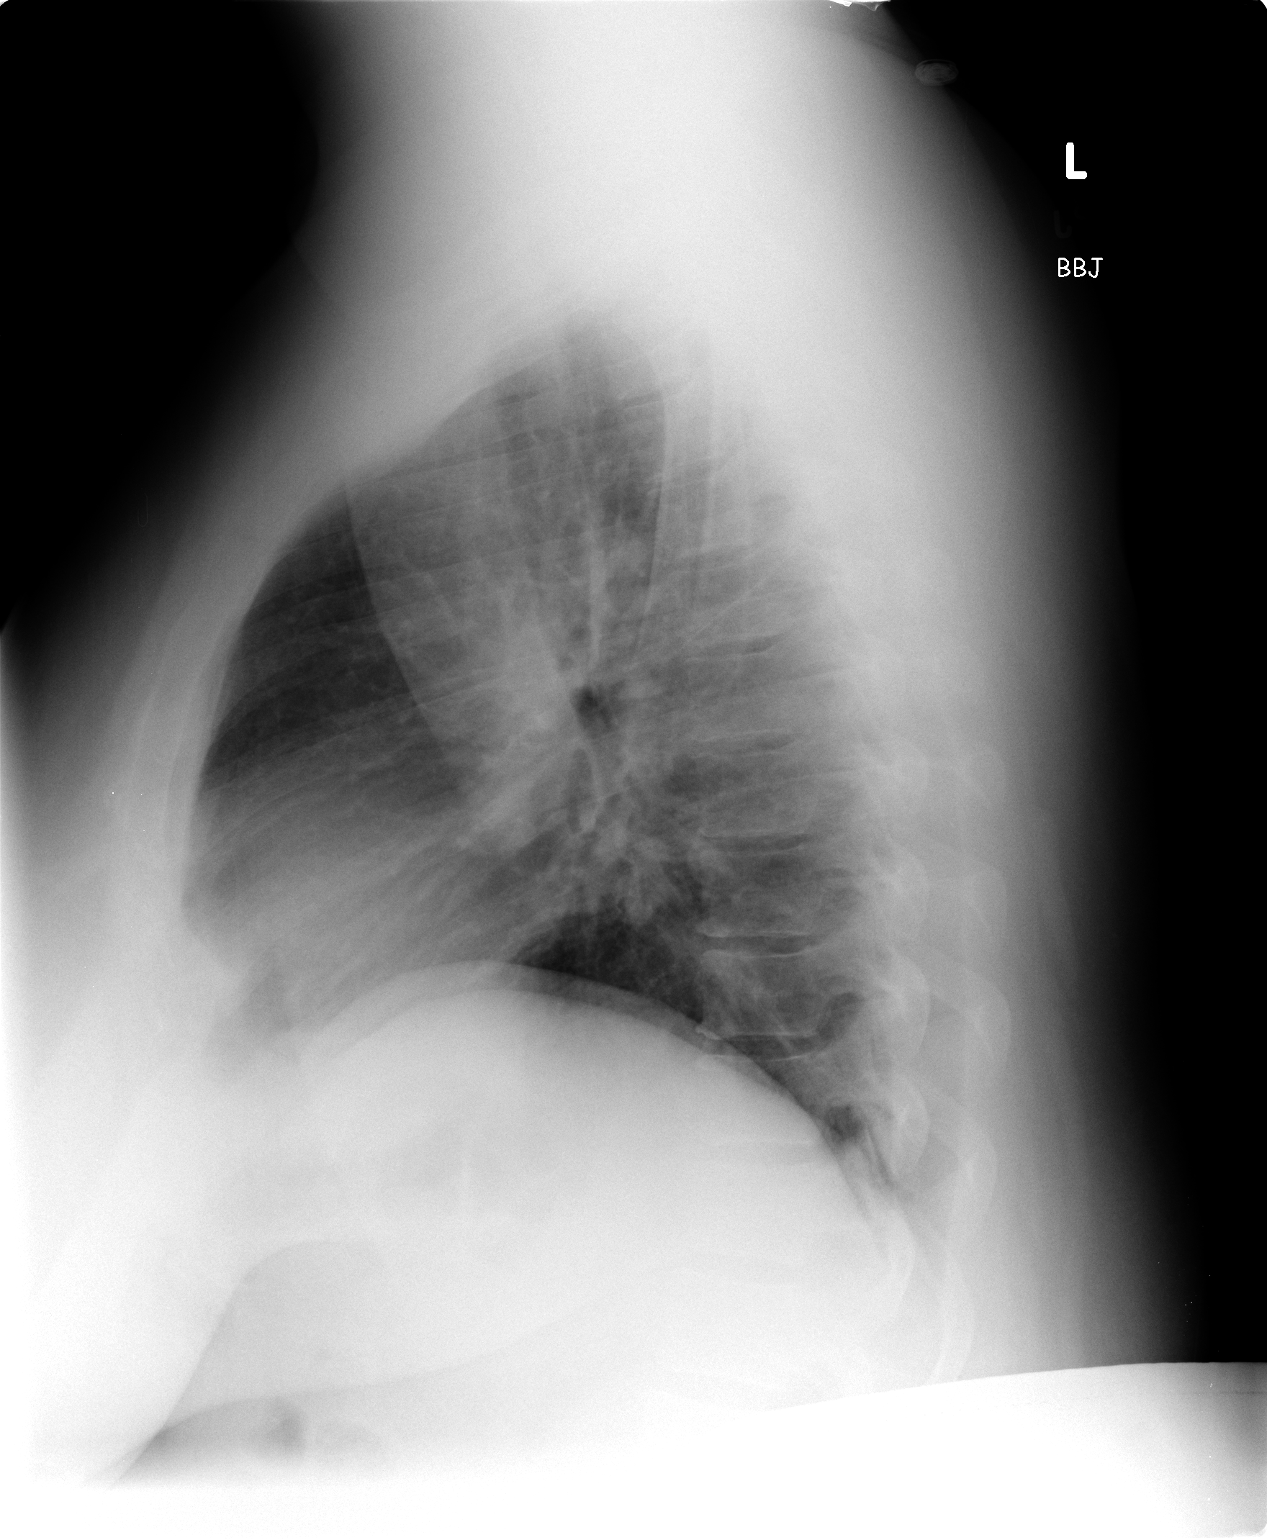

[2 of 2 positions shown; findings below may reference images not displayed]

FINDINGS: Shallow inspiration. The heart size and pulmonary
vascularity are normal. The lungs appear clear and expanded without
focal air space disease or consolidation. No blunting of the
costophrenic angles.
IMPRESSION: No evidence of active pulmonary disease.

## 2013-11-26 ENCOUNTER — Ambulatory Visit (INDEPENDENT_AMBULATORY_CARE_PROVIDER_SITE_OTHER): Payer: BC Managed Care – PPO | Admitting: Orthopedic Surgery

## 2013-11-26 ENCOUNTER — Encounter: Payer: Self-pay | Admitting: Orthopedic Surgery

## 2013-11-26 ENCOUNTER — Ambulatory Visit (INDEPENDENT_AMBULATORY_CARE_PROVIDER_SITE_OTHER): Payer: BC Managed Care – PPO

## 2013-11-26 VITALS — BP 136/81 | Ht 67.0 in | Wt 305.0 lb

## 2013-11-26 DIAGNOSIS — M25539 Pain in unspecified wrist: Secondary | ICD-10-CM

## 2013-11-26 DIAGNOSIS — M7700 Medial epicondylitis, unspecified elbow: Secondary | ICD-10-CM

## 2013-11-26 DIAGNOSIS — M7701 Medial epicondylitis, right elbow: Secondary | ICD-10-CM

## 2013-11-26 NOTE — Patient Instructions (Signed)
Medial Epicondylitis (Golfer's Elbow) with Rehab Medial epicondylitis involves inflammation and pain around the inner (medial) portion of the elbow. This pain is caused by inflammation of the tendons in the forearm that flex (bring down) the wrist. Medial epicondylitis is also called golfer's elbow, because it is common among golfers. However, it may occur in any individual who flexes the wrist regularly. If medial epicondylitis is left untreated, it may become a chronic problem. SYMPTOMS   Pain, tenderness, or inflammation over the inner (medial) side of the elbow.  Pain or weakness with gripping activities.  Pain that increases with wrist twisting motions (using a screwdriver, playing golf, bowling). CAUSES  Medial epicondylitis is caused by inflammation of the tendons that flex the wrist. Causes of injury may include:  Chronic, repetitive stress and strain to the tendons that run from the wrist and forearm to the elbow.  Sudden strain on the forearm, including wrist snap when serving balls with racquet sports, or throwing a baseball. RISK INCREASES WITH:  Sports or occupations that require repetitive and/or strenuous forearm and wrist movements (pitching a baseball, golfing, carpentry).  Poor wrist and forearm strength and flexibility.  Failure to warm up properly before activity.  Resuming activity before healing, rehabilitation, and conditioning are complete. PREVENTION   Warm up and stretch properly before activity.  Maintain physical fitness:  Strength, flexibility, and endurance.  Cardiovascular fitness.  Wear and use properly fitted equipment.  Learn and use proper technique and have a coach correct improper technique.  Wear a tennis elbow (counterforce) brace. PROGNOSIS  The course of this condition depends on the degree of the injury. If treated properly, acute cases (symptoms lasting less than 4 weeks) are often resolved in 2 to 6 weeks. Chronic (longer lasting  cases) often resolve in 3 to 6 months, but may require physical therapy. RELATED COMPLICATIONS   Frequently recurring symptoms, resulting in a chronic problem. Properly treating the problem the first time decreases frequency of recurrence.  Chronic inflammation, scarring, and partial tendon tear, requiring surgery.  Delayed healing or resolution of symptoms. TREATMENT  Treatment first involves the use of ice and medicine, to reduce pain and inflammation. Strengthening and stretching exercises may reduce discomfort, if performed regularly. These exercises may be performed at home, if the condition is an acute injury. Chronic cases may require a referral to a physical therapist for evaluation and treatment. Your caregiver may advise a corticosteroid injection to help reduce inflammation. Rarely, surgery is needed. MEDICATION  If pain medicine is needed, nonsteroidal anti-inflammatory medicines (aspirin and ibuprofen), or other minor pain relievers (acetaminophen), are often advised.  Do not take pain medicine for 7 days before surgery.  Prescription pain relievers may be given, if your caregiver thinks they are needed. Use only as directed and only as much as you need.  Corticosteroid injections may be recommended. These injections should be reserved only for the most severe cases, because they can only be given a certain number of times. HEAT AND COLD  Cold treatment (icing) should be applied for 10 to 15 minutes every 2 to 3 hours for inflammation and pain, and immediately after activity that aggravates your symptoms. Use ice packs or an ice massage.  Heat treatment may be used before performing stretching and strengthening activities prescribed by your caregiver, physical therapist, or athletic trainer. Use a heat pack or a warm water soak. SEEK MEDICAL CARE IF: Symptoms get worse or do not improve in 2 weeks, despite treatment. EXERCISES

## 2013-11-26 NOTE — Progress Notes (Addendum)
Patient ID: Mariah [name], female   DOB: 10/01/1975, 38 y.o.   MRN: [id number]  Chief Complaint  Patient presents with  . Wrist Pain    Right wrist pain radiates to elbow   HISTORY: 38 year old female presents with pain in the forearm area radiating from the elbow down into the wrist for a few months. She says it's actually getting a little better but still has some soreness on the medial side of the forearm and up around the elbow. 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 No numbness or tingling no weakness at this time  Review of systems Review of Systems  Eyes: Positive for blurred vision.  All other systems reviewed and are negative.     The past, family history and social history have been reviewed and are recorded in the corresponding sections of epic   Vital signs: BP 136/81  Ht 5\' 7"  (1.702 m)  Wt 305 lb (138.347 kg)  BMI 47.76 kg/m2  General the patient is well-developed and well-nourished grooming and hygiene are normal Oriented x3 Mood and affect normal Ambulation normal  Inspection of the left forearm shows tenderness over the medial side of the elbow negative Tinel's over the ulnar nerve. Pronation and supination are normal as are flexion extension. Medial lateral stability tests are normal motor exam is normal Skin clean dry and intact Cardiovascular exam is normal Sensory exam normal  X-rays were negative  Encounter Diagnoses  Name Primary?  . Pain in joint, forearm Yes  . Medial epicondylitis of right elbow     Recommend super 7 exercises Tennis elbow brace Followup as needed

## 2014-06-16 ENCOUNTER — Ambulatory Visit: Payer: BC Managed Care – PPO | Admitting: Family Medicine

## 2014-06-29 ENCOUNTER — Encounter: Payer: Self-pay | Admitting: Obstetrics & Gynecology

## 2014-06-29 ENCOUNTER — Ambulatory Visit (INDEPENDENT_AMBULATORY_CARE_PROVIDER_SITE_OTHER): Payer: BC Managed Care – PPO | Admitting: Obstetrics & Gynecology

## 2014-06-29 VITALS — BP 103/80 | HR 79 | Ht 68.0 in | Wt 288.2 lb

## 2014-06-29 DIAGNOSIS — N8501 Benign endometrial hyperplasia: Secondary | ICD-10-CM

## 2014-06-29 DIAGNOSIS — Z124 Encounter for screening for malignant neoplasm of cervix: Secondary | ICD-10-CM

## 2014-06-29 DIAGNOSIS — Z1151 Encounter for screening for human papillomavirus (HPV): Secondary | ICD-10-CM | POA: Diagnosis not present

## 2014-06-29 DIAGNOSIS — Z01419 Encounter for gynecological examination (general) (routine) without abnormal findings: Secondary | ICD-10-CM

## 2014-06-29 MED ORDER — MEDROXYPROGESTERONE ACETATE 10 MG PO TABS: ORAL_TABLET | ORAL | Status: DC

## 2014-06-29 NOTE — Progress Notes (Signed)
Subjective:    Mariah ChimeSusan A [name] is a 38 y.o. S AA G)  female who presents for an annual exam. The patient has no complaints today. She reports that her urine smells odd at times.  The patient is not currently sexually active. GYN screening history: last pap: was abnormal: and she needs a biopsy. The patient wears seatbelts: yes. The patient participates in regular exercise: yes. Has the patient ever been transfused or tattooed?: no. The patient reports that there is not domestic violence in her life.   Menstrual History: OB History   Grav Para Term Preterm Abortions TAB SAB Ect Mult Living                  Menarche age: [age]  Patient's last menstrual period was 06/08/2014.    The following portions of the patient's history were reviewed and updated as appropriate: allergies, current medications, past family history, past medical history, past social history, past surgical history and problem list.  Review of Systems A comprehensive review of systems was negative. Abstinent for about 3 years. Works at [employer] ([place] She declines Flu vaccine. She skips periods, has about 2 periods per year. Ran out of provera 3 years ago.   Objective:    BP 103/80  Pulse 79  Ht 5\' 8"  (1.727 m)  Wt 288 lb 3.2 oz (130.727 kg)  BMI 43.83 kg/m2  LMP 06/08/2014  General Appearance:    Alert, cooperative, no distress, appears stated age  Head:    Normocephalic, without obvious abnormality, atraumatic  Eyes:    PERRL, conjunctiva/corneas clear, EOM's intact, fundi    benign, both eyes  Ears:    Normal TM's and external ear canals, both ears  Nose:   Nares normal, septum midline, mucosa normal, no drainage    or sinus tenderness  Throat:   Lips, mucosa, and tongue normal; teeth and gums normal  Neck:   Supple, symmetrical, trachea midline, no adenopathy;    thyroid:  no enlargement/tenderness/nodules; no carotid   bruit or JVD  Back:     Symmetric, no curvature, ROM normal, no CVA tenderness   Lungs:     Clear to auscultation bilaterally, respirations unlabored  Chest Wall:    No tenderness or deformity   Heart:    Regular rate and rhythm, S1 and S2 normal, no murmur, rub   or gallop  Breast Exam:    No tenderness, masses, or nipple abnormality  Abdomen:     Soft, non-tender, bowel sounds active all four quadrants,    no masses, no organomegaly  Genitalia:    Normal female without lesion, discharge or tenderness, nulliparous cervix, no masses palpated on bimanual exam     Extremities:   Extremities normal, atraumatic, no cyanosis or edema  Pulses:   2+ and symmetric all extremities  Skin:   Skin color, texture, turgor normal, no rashes or lesions  Lymph nodes:   Cervical, supraclavicular, and axillary nodes normal  Neurologic:   CNII-XII intact, normal strength, sensation and reflexes    throughout  .    Assessment:    Healthy female exam.    Plan:     Breast self exam technique reviewed and patient encouraged to perform self-exam monthly. Thin prep Pap smear. with cotesting I stressed the importance of taking provera to decrease her risk of endometrial cancer Provera 10 mg 10 days each month

## 2014-06-30 LAB — CYTOLOGY - PAP

## 2015-07-06 ENCOUNTER — Other Ambulatory Visit: Payer: Self-pay | Admitting: *Deleted

## 2015-07-06 DIAGNOSIS — N8501 Benign endometrial hyperplasia: Secondary | ICD-10-CM

## 2015-07-06 MED ORDER — MEDROXYPROGESTERONE ACETATE 10 MG PO TABS
ORAL_TABLET | ORAL | Status: DC
Start: 2015-07-06 — End: 2015-08-25

## 2015-07-06 NOTE — Telephone Encounter (Signed)
Received rx refill request from pharmacy for Provera 10mg , sent rx refill to [facility] in [place]. Informed pt to schedule appt in the next few months to follow-up with Dr Marice Potterove.

## 2015-08-08 ENCOUNTER — Emergency Department (HOSPITAL_COMMUNITY): Payer: BLUE CROSS/BLUE SHIELD

## 2015-08-08 ENCOUNTER — Encounter (HOSPITAL_COMMUNITY): Payer: Self-pay | Admitting: Emergency Medicine

## 2015-08-08 ENCOUNTER — Emergency Department (HOSPITAL_COMMUNITY)
Admission: EM | Admit: 2015-08-08 | Discharge: 2015-08-08 | Disposition: A | Discharge status: Home / Self Care | Payer: BLUE CROSS/BLUE SHIELD | Attending: Emergency Medicine | Admitting: Emergency Medicine

## 2015-08-08 DIAGNOSIS — J069 Acute upper respiratory infection, unspecified: Secondary | ICD-10-CM | POA: Diagnosis not present

## 2015-08-08 DIAGNOSIS — Z8742 Personal history of other diseases of the female genital tract: Secondary | ICD-10-CM | POA: Insufficient documentation

## 2015-08-08 DIAGNOSIS — Z79899 Other long term (current) drug therapy: Secondary | ICD-10-CM | POA: Insufficient documentation

## 2015-08-08 DIAGNOSIS — J45909 Unspecified asthma, uncomplicated: Secondary | ICD-10-CM | POA: Diagnosis not present

## 2015-08-08 DIAGNOSIS — R1110 Vomiting, unspecified: Secondary | ICD-10-CM | POA: Diagnosis not present

## 2015-08-08 DIAGNOSIS — R05 Cough: Secondary | ICD-10-CM | POA: Diagnosis present

## 2015-08-08 DIAGNOSIS — F17210 Nicotine dependence, cigarettes, uncomplicated: Secondary | ICD-10-CM | POA: Insufficient documentation

## 2015-08-08 MED ORDER — LORATADINE-PSEUDOEPHEDRINE ER 5-120 MG PO TB12: 1.0000 | ORAL_TABLET | Freq: Two times a day (BID) | ORAL | Status: DC

## 2015-08-08 MED ORDER — HYDROCODONE-HOMATROPINE 5-1.5 MG/5ML PO SYRP: 5.0000 mL | ORAL_SOLUTION | Freq: Four times a day (QID) | ORAL | Status: DC | PRN

## 2015-08-08 MED ORDER — IBUPROFEN 600 MG PO TABS: 600.0000 mg | ORAL_TABLET | Freq: Four times a day (QID) | ORAL | Status: DC | PRN

## 2015-08-08 NOTE — ED Notes (Signed)
Pt had exam done by Ivery QualeHobson Bryant with this nurse as chaperone

## 2015-08-08 NOTE — ED Provider Notes (Signed)
CSN: [id number]     Arrival date & time 08/08/15  1354 History  By signing my name below, I, Placido SouLogan Joldersma, attest that this documentation has been prepared under the direction and in the presence of Ivery QualeHobson Lynnsie Linders, PA-C. Electronically Signed: Placido SouLogan Joldersma, ED Scribe. 08/08/2015. 3:17 PM.   Chief Complaint  Patient presents with  . Cough  . Nasal Congestion   Patient is a 39 y.o. female presenting with cough. The history is provided by the patient. No language interpreter was used.  Cough Cough characteristics:  Productive Sputum characteristics:  Yellow Severity:  Moderate Onset quality:  Gradual Duration:  5 days Timing:  Constant Progression:  Worsening Chronicity:  New Smoker: yes   Relieved by:  Nothing Associated symptoms: fever (subjective)     HPI Comments: Mariah [name] is a 39 y.o. female with a hx of asthma who presents to the Emergency Department complaining of a worsening, moderate, productive cough with yellow sputum with onset 5 days ago. Pt notes associated subjective fever, nasal congestion and 1x post tussive emesis. Pt notes one sick contact. She notes a hx of smoking for almost 20 years. She denies hemoptysis and rash.   Past Medical History  Diagnosis Date  . Hypercholesteremia   . Morbid obesity (HCC)   . Simple endometrial hyperplasia without atypia   . Asthma     Albuterol Inhaler last used 7/12  . Menstrual cycle disorder   . Obesity   . Abnormal Pap smear 09/26/10    ASCUS  . Abnormal uterine bleeding    Past Surgical History  Procedure Laterality Date  . Appendectomy    . Cystectomy  1989  . Polyp removal  2010  . Hysteroscopy w/d&c  04/16/2011    Procedure: DILATATION AND CURETTAGE (D&C) /HYSTEROSCOPY;  Surgeon: Tereso NewcomerUgonna A Anyanwu, MD;  Location: [facility];  Service: Gynecology;  Laterality: N/A;  Diagnostic  . Endometrial biopsy  02/13/11    AUB   Family History  Problem Relation Age of Onset  . Diabetes Mother   . Diabetes Maternal  Grandmother   . Diabetes Maternal Grandfather   . Cancer Paternal Grandmother    Social History  Substance Use Topics  . Smoking status: Current Every Day Smoker -- 1.00 packs/day for 14 years    Types: Cigarettes  . Smokeless tobacco: Never Used  . Alcohol Use: 0.0 oz/week     Comment: occassonially   OB History    No data available     Review of Systems  Constitutional: Positive for fever (subjective).  HENT: Positive for congestion.   Respiratory: Positive for cough (productive).   All other systems reviewed and are negative.  Allergies  Review of patient's allergies indicates no known allergies.  Home Medications   Prior to Admission medications   Medication Sig Start Date End Date Taking? Authorizing Provider  albuterol (PROVENTIL HFA;VENTOLIN HFA) 108 (90 BASE) MCG/ACT inhaler Inhale 2 puffs into the lungs every 6 (six) hours as needed. For shortness of breath     Historical Provider, MD  medroxyPROGESTERone (PROVERA) 10 MG tablet Take 1 pill daily days 1-10 of each month. 07/06/15   Allie BossierMyra C Dove, MD   BP 115/64 mmHg  Pulse 103  Temp(Src) 98.4 F (36.9 C) (Oral)  Resp 18  Ht 5\' 8"  (1.727 m)  Wt 281 lb 8 oz (127.688 kg)  BMI 42.81 kg/m2  SpO2 98% Physical Exam  Constitutional: She is oriented to person, place, and time. She appears well-developed and well-nourished.  HENT:  Head: Normocephalic and atraumatic.  Mouth/Throat: No oropharyngeal exudate.  Nasal congestion present; airway is patent; mild increased redness of posterior pharynx  Neck: Normal range of motion. No tracheal deviation present.  Cardiovascular: Normal rate.   Pulmonary/Chest: Effort normal. No respiratory distress.  Coarse breath sounds; symmetrical rise and fall of the chest  Abdominal: Soft. There is no tenderness.  Genitourinary:  Chaperone present during the exam; no mass appreciated by palpation of the right chest or breast area; no nipple discharge; no breast pain appreciated   Musculoskeletal: Normal range of motion. She exhibits no edema.  Cap refill less than 2 seconds; no edema of LE's  Neurological: She is alert and oriented to person, place, and time.  Skin: Skin is warm and dry. She is not diaphoretic.  Psychiatric: She has a normal mood and affect. Her behavior is normal.  Nursing note and vitals reviewed.  ED Course  Procedures  DIAGNOSTIC STUDIES: Oxygen Saturation is 98% on RA, normal by my interpretation.    COORDINATION OF CARE: 3:12 PM Pt presents today due to a worsening cough. Discussed next steps with pt at bedside including a CT of the chest based on results of the initial DG. Pt will receive afrin, Tylenol and ibuprofen. Pt agreed to plan.  Labs Review Labs Reviewed - No data to display  Imaging Review Dg Chest 2 View  08/08/2015  CLINICAL DATA:  Shortness of breath and productive cough for 5 days EXAM: CHEST  2 VIEW COMPARISON:  February 09, 2011 FINDINGS: There is mild bibasilar atelectatic change. There is no edema or consolidation. There is an 8 x 8 mm nodular opacity either in or overlying the posterior right fourth rib. Heart size and pulmonary vascularity are normal. No adenopathy. No bone lesions. IMPRESSION: Mild bibasilar atelectatic change.  No edema or consolidation. 8 x 8 mm nodular opacity either in or overlying the anterior right fourth rib. It may be prudent to consider apical lordotic chest radiograph to further evaluate for potential nodular opacity in this area. Alternatively, noncontrast enhanced chest CT could be helpful to further assess this region. Electronically Signed   By: Bretta Bang III M.D.   On: 08/08/2015 14:51   I have personally reviewed and evaluated these images as part of my medical decision-making.   EKG Interpretation None      MDM Vital signs are within normal limits. Chest x-ray showed no acute event, but did question 8 x 8 mm nodular opacity either in or overlying the anterior right fourth rib. A  CT scan of the chest was obtained and read as negative, no acute findings, and no pulmonary nodule noted. The patient's examination favors an upper respiratory infection. The patient will be placed on cough medication, Claritin-D, ibuprofen for soreness or fevers.    Final diagnoses:  None    **I have reviewed nursing notes, vital signs, and all appropriate lab and imaging results for this patient.*  **I personally performed the services described in this documentation, which was scribed in my presence. The recorded information has been reviewed and is accurate.Ivery Quale, PA-C 08/08/15 1647  Rolland Porter, MD 08/17/15 [phone]

## 2015-08-08 NOTE — ED Notes (Signed)
Having cough and nasal congestion for 4 days.  Productive dark yellow secretions.

## 2015-08-08 NOTE — Discharge Instructions (Signed)
Upper Respiratory Infection, Adult  the CT scan of your chest is negative for any nodules, fracture, or pneumonias. Please use the medications as prescribed, and increase fluids. Please wash hands frequently. Hycodan may cause drowsiness, please use with caution. Most upper respiratory infections (URIs) are caused by a virus. A URI affects the nose, throat, and upper air passages. The most common type of URI is often called "the common cold." HOME CARE   Take medicines only as told by your doctor.  Gargle warm saltwater or take cough drops to comfort your throat as told by your doctor.  Use a warm mist humidifier or inhale steam from a shower to increase air moisture. This may make it easier to breathe.  Drink enough fluid to keep your pee (urine) clear or pale yellow.  Eat soups and other clear broths.  Have a healthy diet.  Rest as needed.  Go back to work when your fever is gone or your doctor says it is okay.  You may need to stay home longer to avoid giving your URI to others.  You can also wear a face mask and wash your hands often to prevent spread of the virus.  Use your inhaler more if you have asthma.  Do not use any tobacco products, including cigarettes, chewing tobacco, or electronic cigarettes. If you need help quitting, ask your doctor. GET HELP IF:  You are getting worse, not better.  Your symptoms are not helped by medicine.  You have chills.  You are getting more short of breath.  You have brown or red mucus.  You have yellow or brown discharge from your nose.  You have pain in your face, especially when you bend forward.  You have a fever.  You have puffy (swollen) neck glands.  You have pain while swallowing.  You have white areas in the back of your throat. GET HELP RIGHT AWAY IF:   You have very bad or constant:  Headache.  Ear pain.  Pain in your forehead, behind your eyes, and over your cheekbones (sinus pain).  Chest pain.  You  have long-lasting (chronic) lung disease and any of the following:  Wheezing.  Long-lasting cough.  Coughing up blood.  A change in your usual mucus.  You have a stiff neck.  You have changes in your:  Vision.  Hearing.  Thinking.  Mood. MAKE SURE YOU:   Understand these instructions.  Will watch your condition.  Will get help right away if you are not doing well or get worse.   This information is not intended to replace advice given to you by your health care provider. Make sure you discuss any questions you have with your health care provider.   Document Released: 02/06/2008 Document Revised: 01/04/2015 Document Reviewed: 11/25/2013 Elsevier Interactive Patient Education [employer]2016 Elsevier Inc.

## 2015-08-25 ENCOUNTER — Encounter: Payer: Self-pay | Admitting: *Deleted

## 2015-08-25 ENCOUNTER — Encounter: Payer: Self-pay | Admitting: Obstetrics and Gynecology

## 2015-08-25 ENCOUNTER — Ambulatory Visit (INDEPENDENT_AMBULATORY_CARE_PROVIDER_SITE_OTHER): Payer: BLUE CROSS/BLUE SHIELD | Admitting: Obstetrics and Gynecology

## 2015-08-25 VITALS — BP 122/82 | HR 80 | Ht 67.0 in | Wt 278.0 lb

## 2015-08-25 DIAGNOSIS — Z01419 Encounter for gynecological examination (general) (routine) without abnormal findings: Secondary | ICD-10-CM | POA: Diagnosis not present

## 2015-08-25 DIAGNOSIS — Z1151 Encounter for screening for human papillomavirus (HPV): Secondary | ICD-10-CM | POA: Diagnosis not present

## 2015-08-25 DIAGNOSIS — Z124 Encounter for screening for malignant neoplasm of cervix: Secondary | ICD-10-CM | POA: Diagnosis not present

## 2015-08-25 DIAGNOSIS — Z113 Encounter for screening for infections with a predominantly sexual mode of transmission: Secondary | ICD-10-CM | POA: Diagnosis not present

## 2015-08-25 DIAGNOSIS — N8501 Benign endometrial hyperplasia: Secondary | ICD-10-CM

## 2015-08-25 LAB — HIV ANTIBODY (ROUTINE TESTING W REFLEX): HIV 1&2 Ab, 4th Generation: NONREACTIVE

## 2015-08-25 MED ORDER — MEDROXYPROGESTERONE ACETATE 10 MG PO TABS: ORAL_TABLET | ORAL | Status: DC

## 2015-08-25 NOTE — Progress Notes (Signed)
Subjective:     Mariah [name] is a 39 y.o. female  With BMI 1143 who is here for a comprehensive physical exam. The patient reports some concerns with STD exposure. She reports the presence of a non-pruritic vaginal discharge with odor for the past 4 weeks. She is requesting full STD testing. Patient also with h/o hyperplasia without atypia and has not had a repeat endometrial biopsy. She has been taking the provera as prescribed. She reports a monthly cycle lasting 7 days. She is without any other concerns.  Past Medical History  Diagnosis Date  . Hypercholesteremia   . Morbid obesity (HCC)   . Simple endometrial hyperplasia without atypia   . Asthma     Albuterol Inhaler last used 7/12  . Menstrual cycle disorder   . Obesity   . Abnormal Pap smear 09/26/10    ASCUS  . Abnormal uterine bleeding    Past Surgical History  Procedure Laterality Date  . Appendectomy    . Cystectomy  1989  . Polyp removal  2010  . Hysteroscopy w/d&c  04/16/2011    Procedure: DILATATION AND CURETTAGE (D&C) /HYSTEROSCOPY;  Surgeon: Tereso NewcomerUgonna A Anyanwu, MD;  Location: [facility];  Service: Gynecology;  Laterality: N/A;  Diagnostic  . Endometrial biopsy  02/13/11    AUB   Family History  Problem Relation Age of Onset  . Diabetes Mother   . Diabetes Maternal Grandmother   . Diabetes Maternal Grandfather   . Cancer Paternal Grandmother     Social History   Social History  . Marital Status: Single    Spouse Name: N/A  . Number of Children: N/A  . Years of Education: N/A   Occupational History  . Not on file.   Social History Main Topics  . Smoking status: Current Every Day Smoker -- 1.00 packs/day for 14 years    Types: Cigarettes  . Smokeless tobacco: Never Used  . Alcohol Use: 0.0 oz/week     Comment: occassonially  . Drug Use: No  . Sexual Activity: Not Currently   Other Topics Concern  . Not on file   Social History Narrative   Health Maintenance  Topic Date Due  . HIV Screening   11/10/1991  . TETANUS/TDAP  11/10/1995  . INFLUENZA VACCINE  04/04/2015  . PAP SMEAR  06/29/2017       Review of Systems Pertinent items are noted in HPI.   Objective:      GENERAL: Well-developed, well-nourished female in no acute distress. Obese HEENT: Normocephalic, atraumatic. Sclerae anicteric.  NECK: Supple. Normal thyroid.  LUNGS: Clear to auscultation bilaterally.  HEART: Regular rate and rhythm. BREASTS: Symmetric in size. No palpable masses or lymphadenopathy, skin changes, or nipple drainage. ABDOMEN: Soft, nontender, nondistended. Obese PELVIC: Normal external female genitalia. Vagina is pink and rugated.  Normal discharge. Normal appearing cervix. Bimanual exam limited secondary to body habitus. No adnexal mass or tenderness were appreciated on exam. EXTREMITIES: No cyanosis, clubbing, or edema, 2+ distal pulses.    Assessment:    Healthy female exam.      Plan:    - Pap smear performed with cultures - HIV, Hepatitis and syphilis ordered per patient request - Wet prep collected - patient advised to perform self breast exam monthly - Patient with a history of endometrial hyperplasia without atypia in 2012. She has been taking provera daily. Discussed the need for an endometrial biopsy to document regression/progression of disease. Patient verbalized understanding and agreed to the procedure ENDOMETRIAL BIOPSY  The indications for endometrial biopsy were reviewed.   Risks of the biopsy including cramping, bleeding, infection, uterine perforation, inadequate specimen and need for additional procedures  were discussed. The patient states she understands and agrees to undergo procedure today. Consent was signed. Time out was performed. Urine HCG was negative. A sterile speculum was placed in the patient's vagina and the cervix was prepped with Betadine. A single-toothed tenaculum was placed on the anterior lip of the cervix to stabilize it. The uterine cavity was  sounded to a depth of 8 cm using the uterine sound. The 3 mm pipelle was introduced into the endometrial cavity without difficulty, 2 passes were made.  A  moderate amount of tissue was  sent to pathology. The instruments were removed from the patient's vagina. Minimal bleeding from the cervix was noted. The patient tolerated the procedure well.  Routine post-procedure instructions were given to the patient. The patient will follow up in two weeks to review the results and for further management.  - Rx provera also provided - Patient will be contacted with any abnormal results See After Visit Summary for Counseling Recommendations

## 2015-08-26 LAB — WET PREP, GENITAL
TRICH WET PREP: NONE SEEN
YEAST WET PREP: NONE SEEN

## 2015-08-26 LAB — HEPATITIS C ANTIBODY: HCV Ab: NEGATIVE

## 2015-08-26 LAB — HEPATITIS B SURFACE ANTIGEN: HEP B S AG: NEGATIVE

## 2015-08-26 LAB — CYTOLOGY - PAP

## 2015-08-26 LAB — RPR

## 2015-08-27 MED ORDER — METRONIDAZOLE 500 MG PO TABS: 500.0000 mg | ORAL_TABLET | Freq: Two times a day (BID) | ORAL | Status: AC

## 2015-08-27 NOTE — Addendum Note (Signed)
Addended by: Catalina AntiguaONSTANT, Jalisa Sacco on: 08/27/2015 09:32 AM   Modules accepted: Orders

## 2016-11-15 IMAGING — DX DG CHEST 2V
2 series · 2 of 2 positions shown · non-contrast
Comparison: February 09, 2011

CLINICAL DATA: Shortness of breath and productive cough for 5 days

EXAM:
CHEST  2 VIEW

[chest pa]
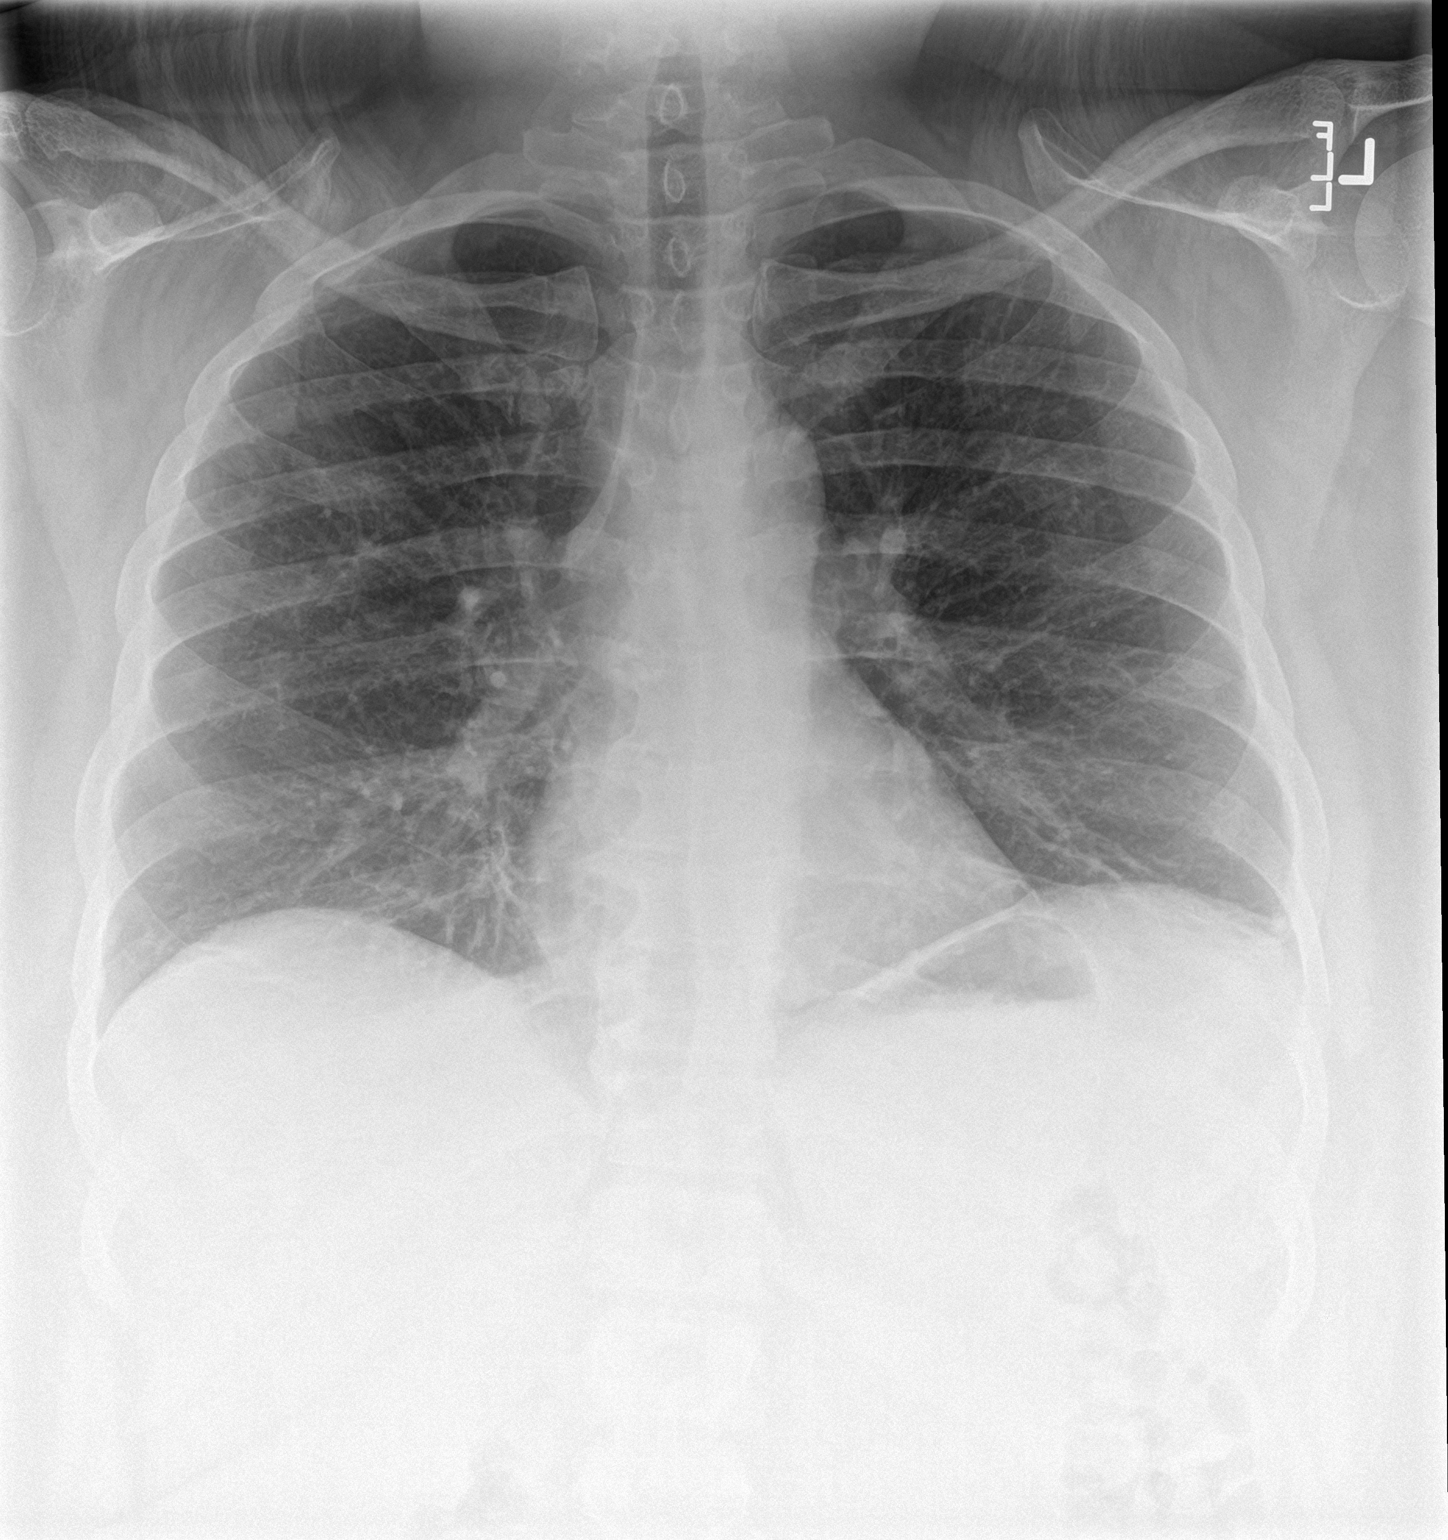

[chest lat]
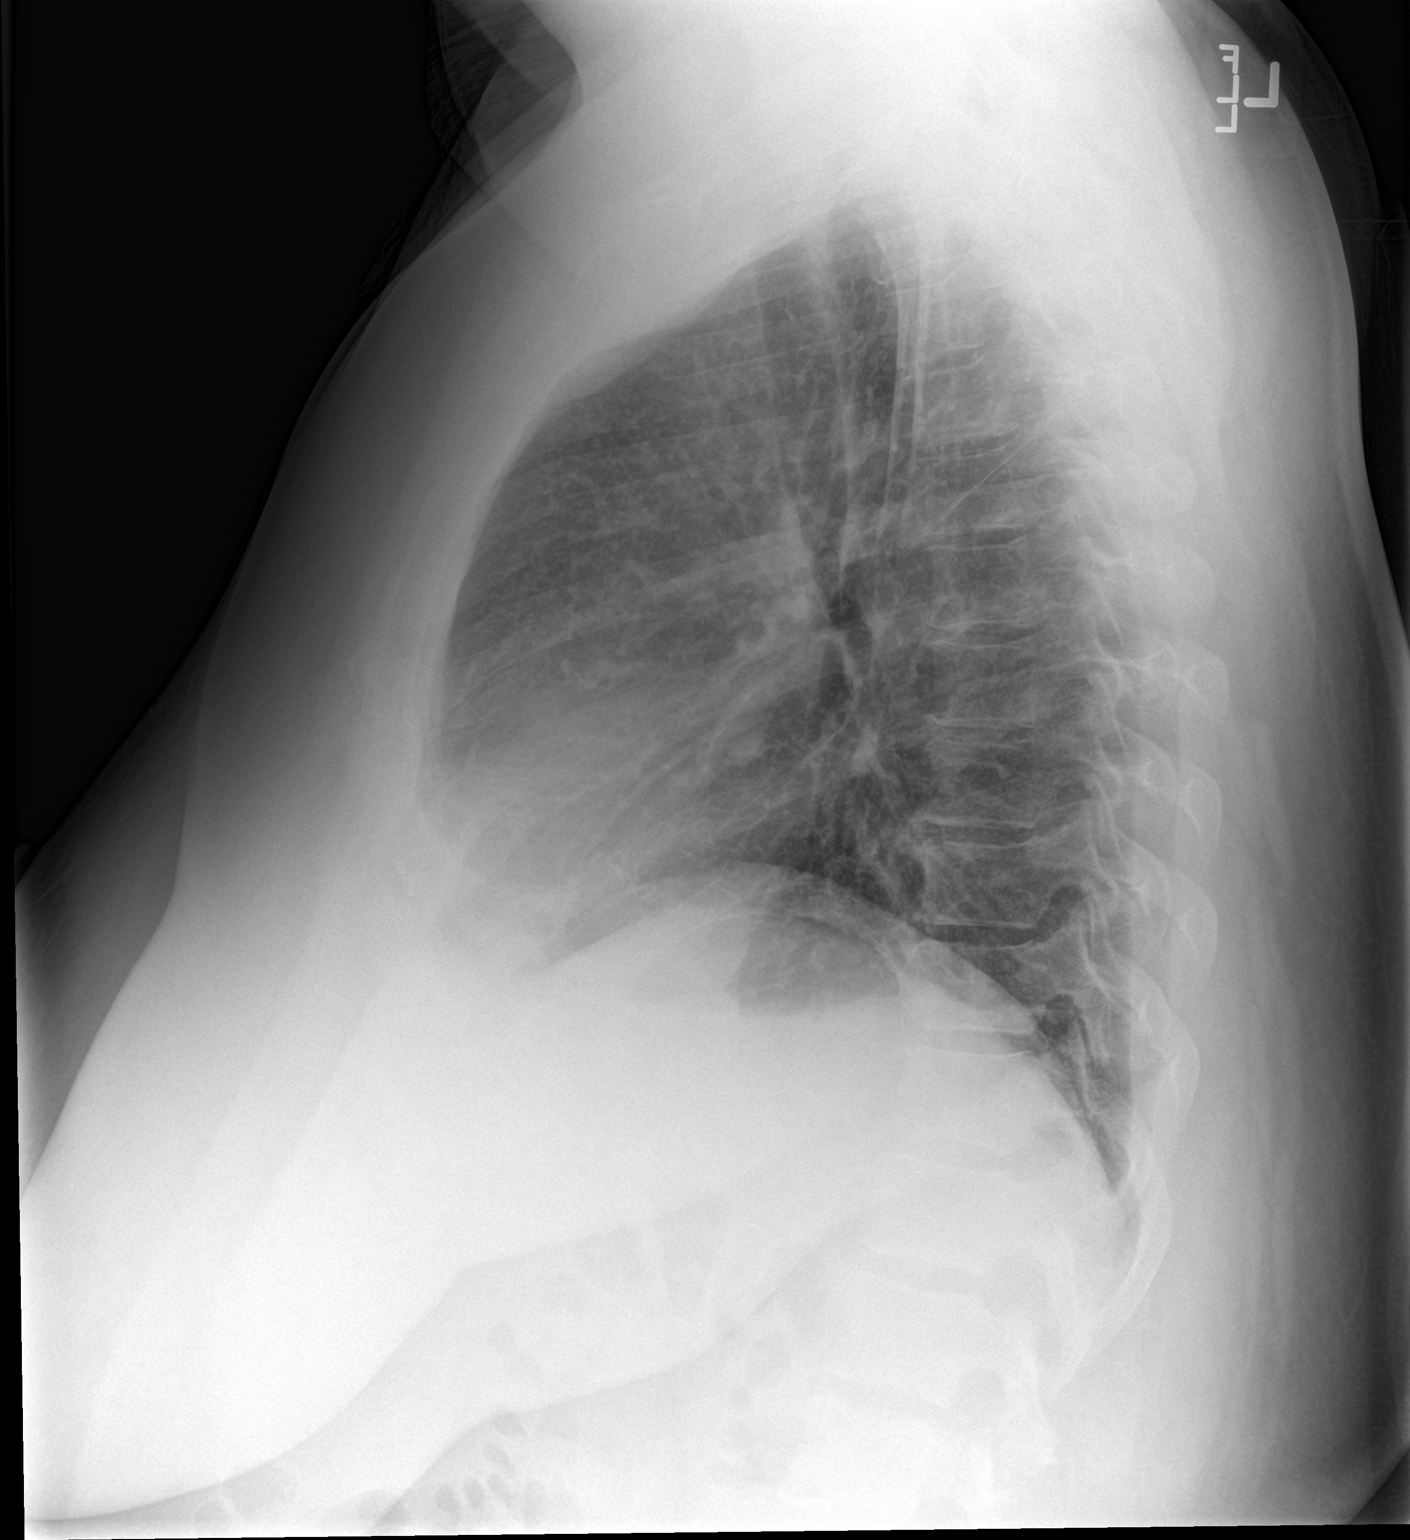

[2 of 2 positions shown; findings below may reference images not displayed]

FINDINGS: There is mild bibasilar atelectatic change. There is no edema or
consolidation. There is an 8 x 8 mm nodular opacity either in or
overlying the posterior right fourth rib. Heart size and pulmonary
vascularity are normal. No adenopathy. No bone lesions.
IMPRESSION: Mild bibasilar atelectatic change.  No edema or consolidation.

8 x 8 mm nodular opacity either in or overlying the anterior right
fourth rib. It may be prudent to consider apical lordotic chest
radiograph to further evaluate for potential nodular opacity in this
area. Alternatively, noncontrast enhanced chest CT could be helpful
to further assess this region.

## 2016-12-06 ENCOUNTER — Other Ambulatory Visit: Payer: Self-pay | Admitting: Obstetrics and Gynecology

## 2016-12-06 DIAGNOSIS — N8501 Benign endometrial hyperplasia: Secondary | ICD-10-CM

## 2017-03-25 ENCOUNTER — Ambulatory Visit (INDEPENDENT_AMBULATORY_CARE_PROVIDER_SITE_OTHER): Payer: BLUE CROSS/BLUE SHIELD | Admitting: Obstetrics & Gynecology

## 2017-03-25 ENCOUNTER — Other Ambulatory Visit (HOSPITAL_COMMUNITY)
Admission: RE | Admit: 2017-03-25 | Discharge: 2017-03-25 | Disposition: A | Discharge status: Home / Self Care | Payer: BLUE CROSS/BLUE SHIELD | Source: Ambulatory Visit | Attending: Obstetrics & Gynecology | Admitting: Obstetrics & Gynecology

## 2017-03-25 ENCOUNTER — Encounter: Payer: Self-pay | Admitting: Obstetrics & Gynecology

## 2017-03-25 VITALS — BP 116/82 | HR 74 | Ht 68.0 in | Wt 271.0 lb

## 2017-03-25 DIAGNOSIS — Z01419 Encounter for gynecological examination (general) (routine) without abnormal findings: Secondary | ICD-10-CM | POA: Diagnosis not present

## 2017-03-25 DIAGNOSIS — B9689 Other specified bacterial agents as the cause of diseases classified elsewhere: Secondary | ICD-10-CM

## 2017-03-25 DIAGNOSIS — A5901 Trichomonal vulvovaginitis: Secondary | ICD-10-CM | POA: Diagnosis not present

## 2017-03-25 DIAGNOSIS — N8501 Benign endometrial hyperplasia: Secondary | ICD-10-CM | POA: Insufficient documentation

## 2017-03-25 DIAGNOSIS — Z113 Encounter for screening for infections with a predominantly sexual mode of transmission: Secondary | ICD-10-CM

## 2017-03-25 DIAGNOSIS — N760 Acute vaginitis: Secondary | ICD-10-CM

## 2017-03-25 DIAGNOSIS — B373 Candidiasis of vulva and vagina: Secondary | ICD-10-CM | POA: Diagnosis not present

## 2017-03-25 DIAGNOSIS — B3731 Acute candidiasis of vulva and vagina: Secondary | ICD-10-CM

## 2017-03-25 DIAGNOSIS — Z1231 Encounter for screening mammogram for malignant neoplasm of breast: Secondary | ICD-10-CM

## 2017-03-25 MED ORDER — FLUCONAZOLE 150 MG PO TABS: 150.0000 mg | ORAL_TABLET | Freq: Once | ORAL | 3 refills | Status: AC

## 2017-03-25 MED ORDER — MEGESTROL ACETATE 40 MG PO TABS: 40.0000 mg | ORAL_TABLET | Freq: Every day | ORAL | 5 refills | Status: DC

## 2017-03-25 NOTE — Patient Instructions (Addendum)
Endometrial Hyperplasia Endometrial hyperplasia is abnormal thickening of the tissue that lines the inside of the uterus (endometrium). This condition can also cause cell changes (atypia) that can lead to endometrial cancer. There are four types of endometrial hyperplasia:  Endometrial thickening only (simple hyperplasia).  Endometrial thickening and crowding of cells (complex hyperplasia).  Endometrial thickening and cell changes (simple atypical hyperplasia). This type has a low risk of becoming cancerous.  Endometrial thickening, cell crowding, and cell changes (complex atypical hyperplasia). This type has a higher risk of becoming cancerous.  Early diagnosis and treatment are important to reduce the risk of cancer. What are the causes? This condition is caused by an imbalance of the reproductive hormones estrogen and progesterone. At the beginning of your menstrual cycle, your ovaries make estrogen. This thickens the lining of your uterus to prepare for pregnancy. If pregnancy does not occur, your estrogen level drops. Then, the hormone progesterone prompts your body to shed the lining of your uterus. This is your menstrual period. Endometrial hyperplasia results from too much estrogen and not enough progesterone. What increases the risk? The following factors may make you more likely to develop this condition:  Being older than 40 years of age. The condition is most common just before or after menopause. Menopause means 12 months without a menstrual period.  Taking an estrogen medicine or a medicine that acts like estrogen.  Having polycystic ovary syndrome (PCOS).  Being overweight.  Smoking.  Having diabetes.  Having irregular periods.  Having started periods early.  Having started menopause late.  Never having been pregnant.  Having a family history of uterine, ovarian, or colon cancer.  Having other diseases such as gallbladder or thyroid disease.  What are the  signs or symptoms? The main symptom of this condition is abnormal vaginal bleeding. The bleeding may:  Be heavier and longer during menstrual periods.  Happen more often than a normal menstrual cycle.  Occur after menopause.  How is this diagnosed? This condition may be diagnosed based on:  Your symptoms and medical history, including risk factors.  A physical exam.  Tests, such as: ? An imaging study done with sound waves (transvaginal ultrasound) to check the endometrium. This test uses a sound wave probe that is inserted into your vagina. ? A procedure to take a sample of endometrial tissue through the vagina so it can be looked at under a microscope (endometrial biopsy). ? A procedure in which tissue is scraped or suctioned from the inside of the uterus (dilation and curettage). ? A procedure in which a thin, lighted device is inserted into the uterus through the cervix to see inside the uterus (hysteroscopy).  How is this treated? Treatment for this condition depends on the type of hyperplasia that you have. Synthetic progesterone (progestin) is the most common treatment. Progestin can be given as a pill, injection, or vaginal cream, or as a device that is implanted in your uterus (intrauterine device, IUD). If you have hyperplasia with atypia, you may need surgery to remove your uterus (hysterectomy). You may be more likely to have this procedure if:  You have complex atypical hyperplasia.  You are past 40 menopause.  You do not want to become pregnant.  Follow these instructions at home:  Take over-the-counter and prescription medicines only as told by your health care provider.  Do not use any products that contain nicotine or tobacco, such as cigarettes and e-cigarettes. If you need help quitting, ask your health care provider.  Lose weight if  you are overweight. Ask your health care provider to recommend a diet and exercise program to help you reach and maintain a healthy  weight.  Keep all follow-up visits as told by your health care provider. This is important. Contact a health care provider if:  You have periods that are heavier or last longer than usual.  You have your period more often than usual.  You have vaginal bleeding after menopause. Get help right away if:  You have vaginal bleeding that is so heavy that you need to change your tampon or sanitary napkin after less than 2 hours.  You pass blood clots that are larger than the size of a quarter. Summary  Endometrial hyperplasia is abnormal thickening of the tissue that lines the inside of the uterus (endometrium).  The main symptom of this condition is abnormal vaginal bleeding. This may be heavier, longer, or more frequent periods, or it may be vaginal bleeding after menopause.  Some types of hyperplasia also cause cell changes (atypia) that can lead to cancer.  Always let your health care provider know about abnormal vaginal bleeding.  Early diagnosis and treatment are important to reduce the risk of cancer. This information is not intended to replace advice given to you by your health care provider. Make sure you discuss any questions you have with your health care provider. Document Released: 06/01/2016 Document Revised: 06/01/2016 Document Reviewed: 06/01/2016 Elsevier Interactive Patient Education  2018 [employer].  Levonorgestrel intrauterine device (IUD) What is this medicine? LEVONORGESTREL IUD (LEE voe nor jes trel) is a contraceptive (birth control) device. The device is placed inside the uterus by a healthcare professional. It is used to prevent pregnancy. This device can also be used to treat heavy bleeding that occurs during your period or abnormal cells in uterus. This medicine may be used for other purposes; ask your health care provider or pharmacist if you have questions. COMMON BRAND NAME(S): Minette Headland What should I tell my health care provider  before I take this medicine? They need to know if you have any of these conditions: -abnormal Pap smear -cancer of the breast, uterus, or cervix -diabetes -endometritis -genital or pelvic infection now or in the past -have more than one sexual partner or your partner has more than one partner -heart disease -history of an ectopic or tubal pregnancy -immune system problems -IUD in place -liver disease or tumor -problems with blood clots or take blood-thinners -seizures -use intravenous drugs -uterus of unusual shape -vaginal bleeding that has not been explained -an unusual or allergic reaction to levonorgestrel, other hormones, silicone, or polyethylene, medicines, foods, dyes, or preservatives -pregnant or trying to get pregnant -breast-feeding How should I use this medicine? This device is placed inside the uterus by a health care professional. Talk to your pediatrician regarding the use of this medicine in children. Special care may be needed. Overdosage: If you think you have taken too much of this medicine contact a poison control center or emergency room at once. NOTE: This medicine is only for you. Do not share this medicine with others. What if I miss a dose? This does not apply. Depending on the brand of device you have inserted, the device will need to be replaced every 3 to 5 years if you wish to continue using this type of birth control. What may interact with this medicine? Do not take this medicine with any of the following medications: -amprenavir -bosentan -fosamprenavir This medicine may also interact with the following  medications: -aprepitant -armodafinil -barbiturate medicines for inducing sleep or treating seizures -bexarotene -boceprevir -griseofulvin -medicines to treat seizures like carbamazepine, ethotoin, felbamate, oxcarbazepine, phenytoin, topiramate -modafinil -pioglitazone -rifabutin -rifampin -rifapentine -some medicines to treat HIV  infection like atazanavir, efavirenz, indinavir, lopinavir, nelfinavir, tipranavir, ritonavir -St. John's wort -warfarin This list may not describe all possible interactions. Give your health care provider a list of all the medicines, herbs, non-prescription drugs, or dietary supplements you use. Also tell them if you smoke, drink alcohol, or use illegal drugs. Some items may interact with your medicine. What should I watch for while using this medicine? Visit your doctor or health care professional for regular check ups. See your doctor if you or your partner has sexual contact with others, becomes HIV positive, or gets a sexual transmitted disease. This product does not protect you against HIV infection (AIDS) or other sexually transmitted diseases. You can check the placement of the IUD yourself by reaching up to the top of your vagina with clean fingers to feel the threads. Do not pull on the threads. It is a good habit to check placement after each menstrual period. Call your doctor right away if you feel more of the IUD than just the threads or if you cannot feel the threads at all. The IUD may come out by itself. You may become pregnant if the device comes out. If you notice that the IUD has come out use a backup birth control method like condoms and call your health care provider. Using tampons will not change the position of the IUD and are okay to use during your period. This IUD can be safely scanned with magnetic resonance imaging (MRI) only under specific conditions. Before you have an MRI, tell your healthcare provider that you have an IUD in place, and which type of IUD you have in place. What side effects may I notice from receiving this medicine? Side effects that you should report to your doctor or health care professional as soon as possible: -allergic reactions like skin rash, itching or hives, swelling of the face, lips, or tongue -fever, flu-like symptoms -genital sores -high  blood pressure -no menstrual period for 6 weeks during use -pain, swelling, warmth in the leg -pelvic pain or tenderness -severe or sudden headache -signs of pregnancy -stomach cramping -sudden shortness of breath -trouble with balance, talking, or walking -unusual vaginal bleeding, discharge -yellowing of the eyes or skin Side effects that usually do not require medical attention (report to your doctor or health care professional if they continue or are bothersome): -acne -breast pain -change in sex drive or performance -changes in weight -cramping, dizziness, or faintness while the device is being inserted -headache -irregular menstrual bleeding within first 3 to 6 months of use -nausea This list may not describe all possible side effects. Call your doctor for medical advice about side effects. You may report side effects to FDA at 1-800-FDA-1088. Where should I keep my medicine? This does not apply. NOTE: This sheet is a summary. It may not cover all possible information. If you have questions about this medicine, talk to your doctor, pharmacist, or health care provider.  2018 Elsevier/Gold Standard (2016-06-01 14:14:56)   Preventive Care 18-39 Years, Female Preventive care refers to lifestyle choices and visits with your health care provider that can promote health and wellness. What does preventive care include?  A yearly physical exam. This is also called an annual well check.  Dental exams once or twice a year.  Routine eye  exams. Ask your health care provider how often you should have your eyes checked.  Personal lifestyle choices, including: ? Daily care of your teeth and gums. ? Regular physical activity. ? Eating a healthy diet. ? Avoiding tobacco and drug use. ? Limiting alcohol use. ? Practicing safe sex. ? Taking vitamin and mineral supplements as recommended by your health care provider. What happens during an annual well check? The services and  screenings done by your health care provider during your annual well check will depend on your age, overall health, lifestyle risk factors, and family history of disease. Counseling Your health care provider may ask you questions about your:  Alcohol use.  Tobacco use.  Drug use.  Emotional well-being.  Home and relationship well-being.  Sexual activity.  Eating habits.  Work and work Statistician.  Method of birth control.  Menstrual cycle.  Pregnancy history.  Screening You may have the following tests or measurements:  Height, weight, and BMI.  Diabetes screening. This is done by checking your blood sugar (glucose) after you have not eaten for a while (fasting).  Blood pressure.  Lipid and cholesterol levels. These may be checked every 5 years starting at age 20.  Skin check.  Hepatitis C blood test.  Hepatitis B blood test.  Sexually transmitted disease (STD) testing.  BRCA-related cancer screening. This may be done if you have a family history of breast, ovarian, tubal, or peritoneal cancers.  Pelvic exam and Pap test. This may be done every 3 years starting at age 80. Starting at age 47, this may be done every 5 years if you have a Pap test in combination with an HPV test.  Discuss your test results, treatment options, and if necessary, the need for more tests with your health care provider. Vaccines Your health care provider may recommend certain vaccines, such as:  Influenza vaccine. This is recommended every year.  Tetanus, diphtheria, and acellular pertussis (Tdap, Td) vaccine. You may need a Td booster every 10 years.  Varicella vaccine. You may need this if you have not been vaccinated.  HPV vaccine. If you are 31 or younger, you may need three doses over 6 months.  Measles, mumps, and rubella (MMR) vaccine. You may need at least one dose of MMR. You may also need a second dose.  Pneumococcal 13-valent conjugate (PCV13) vaccine. You may need  this if you have certain conditions and were not previously vaccinated.  Pneumococcal polysaccharide (PPSV23) vaccine. You may need one or two doses if you smoke cigarettes or if you have certain conditions.  Meningococcal vaccine. One dose is recommended if you are age 74-21 years and a first-year college student living in a residence hall, or if you have one of several medical conditions. You may also need additional booster doses.  Hepatitis A vaccine. You may need this if you have certain conditions or if you travel or work in places where you may be exposed to hepatitis A.  Hepatitis B vaccine. You may need this if you have certain conditions or if you travel or work in places where you may be exposed to hepatitis B.  Haemophilus influenzae type b (Hib) vaccine. You may need this if you have certain risk factors.  Talk to your health care provider about which screenings and vaccines you need and how often you need them. This information is not intended to replace advice given to you by y our health care provider. Make sure you discuss any questions you have with yourhealth  care provider. Document Released: 10/16/2001 Document Revised: 05/09/2016 Document Reviewed: 06/21/2015 Elsevier Interactive Patient Education  2017 [employer].

## 2017-03-25 NOTE — Progress Notes (Signed)
GYNECOLOGY ANNUAL PREVENTATIVE CARE ENCOUNTER NOTE  Subjective:   Mariah [name] is a 41 y.o. G0 female here for a routine annual gynecologic exam.  Current complaints: vulvar irritation and abnormal vaginal discharge for the past week. 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 Last endometrial biopsy was unchanged in 08/2015.    Denies abnormal vaginal bleeding, pelvic pain, problems with intercourse or other gynecologic concerns.    Gynecologic History Patient's last menstrual period was 03/12/2017. Contraception: condoms Last Pap: 08/25/2015. Results were: normal  Obstetric History OB History  Gravida Para Term Preterm AB Living  0 0 0 0 0 0  SAB TAB Ectopic Multiple Live Births  0 0 0 0 0        Past Medical History:  Diagnosis Date  . Abnormal Pap smear 09/26/10   ASCUS  . Abnormal uterine bleeding   . Asthma    Albuterol Inhaler last used 7/12  . Hypercholesteremia   . Menstrual cycle disorder   . Morbid obesity (HCC)   . Obesity   . Simple endometrial hyperplasia without atypia     Past Surgical History:  Procedure Laterality Date  . APPENDECTOMY    . CYSTECTOMY  1989  . ENDOMETRIAL BIOPSY  02/13/11   AUB  . HYSTEROSCOPY W/D&C  04/16/2011   Procedure: DILATATION AND CURETTAGE (D&C) /HYSTEROSCOPY;  Surgeon: Tereso Newcomer, MD;  Location: [facility];  Service: Gynecology;  Laterality: N/A;  Diagnostic  . polyp removal  2010    Current Outpatient Prescriptions on File Prior to Visit  Medication Sig Dispense Refill  . ibuprofen (ADVIL,MOTRIN) 600 MG tablet Take 1 tablet (600 mg total) by mouth every 6 (six) hours as needed. 30 tablet 0   No current facility-administered medications on file prior to visit.     No Known Allergies  Social History   Social History  . Marital status: Single    Spouse name: N/A  . Number of children: N/A  . Years of  education: N/A   Occupational History  . Not on file.   Social History Main Topics  . Smoking status: Current Every Day Smoker    Packs/day: 1.00    Years: 14.00    Types: Cigarettes  . Smokeless tobacco: Never Used  . Alcohol use 0.0 oz/week     Comment: occassonially  . Drug use: No  . Sexual activity: Not Currently   Other Topics Concern  . Not on file   Social History Narrative  . No narrative on file    Family History  Problem Relation Age of Onset  . Diabetes Mother   . Diabetes Maternal Grandmother   . Diabetes Maternal Grandfather   . Cancer Paternal Grandmother     The following portions of the patient's history were reviewed and updated as appropriate: allergies, current medications, past family history, past medical history, past social history, past surgical history and problem list.  Review of Systems Pertinent items noted in HPI and remainder of comprehensive ROS otherwise negative.   Objective:  BP 116/82   Pulse 74   Ht 5\' 8"  (1.727 m)   Wt 271 lb (122.9 kg)   LMP 03/12/2017   BMI 41.21 kg/m  CONSTITUTIONAL: Well-developed, well-nourished female in no acute distress.  HENT:  Normocephalic, atraumatic, External right and left ear normal. Oropharynx is clear and moist EYES: Conjunctivae and EOM are normal. Pupils  are equal, round, and reactive to light. No scleral icterus.  NECK: Normal range of motion, supple, no masses.  Normal thyroid.  SKIN: Skin is warm and dry. No rash noted. Not diaphoretic. No erythema. No pallor. NEUROLOGIC: Alert and oriented to person, place, and time. Normal reflexes, muscle tone coordination. No cranial nerve deficit noted. PSYCHIATRIC: Normal mood and affect. Normal behavior. Normal judgment and thought content. CARDIOVASCULAR: Normal heart rate noted, regular rhythm RESPIRATORY: Clear to auscultation bilaterally. Effort and breath sounds normal, no problems with respiration noted. BREASTS: Symmetric in size. No masses,  skin changes, nipple drainage, or lymphadenopathy. ABDOMEN: Soft, obese, normal bowel sounds, no distention appreciated.  No tenderness, rebound or guarding.  PELVIC: Normal appearing external genitalia with some erythema in the inferior region of the vulva; normal appearing vaginal mucosa and cervix.  Copious white discharge, testing sample and  Pap smear obtained.  Unable to palpate uterus or adnexa secondary to habitus.  MUSCULOSKELETAL: Normal range of motion. No tenderness.  No cyanosis, clubbing, or edema.  2+ distal pulses.  ENDOMETRIAL BIOPSY     The indications for endometrial biopsy were reviewed.   Risks of the biopsy including cramping, bleeding, infection, uterine perforation, inadequate specimen and need for additional procedures  were discussed. The patient states she understands and agrees to undergo procedure today. Consent was signed. Time out was performed. Urine HCG was negative. During the pelvic exam, the cervix was prepped with Betadine. The 3 mm pipelle was introduced into the endometrial cavity without difficulty to a depth of 8 cm, and a moderate amount of tissue was obtained and sent to pathology. The instruments were removed from the patient's vagina. Minimal bleeding from the cervix was noted. The patient tolerated the procedure well. Routine post-procedure instructions were given to the patient.    Assessment and Plan:  1. Simple endometrial hyperplasia without atypia Emphasized that patient needs at least yearly endometrial biopsy for surveillance to ensure no progression. Also recommended to continue progestin therapy, switched from Provera to Megestrol.  Will monitor response. Recommended levonorgestrel IUD, patient will consider this alternative; also recommended it for contraception (currently on just condoms).  Follow up biopsy results.  - Surgical pathology - megestrol (MEGACE) 40 MG tablet; Take 1 tablet (40 mg total) by mouth daily. Can increase to two tablets a  day in the event of heavy bleeding  Dispense: 60 tablet; Refill: 5  2. Encounter for screening mammogram for breast cancer Mammogram scheduled - [facility] DIGITAL SCREENING TOMO BILATERAL; Future  3. Vulvovaginitis Diflucan presumptively given; will follow up testing - Cervicovaginal ancillary only - fluconazole (DIFLUCAN) 150 MG tablet; Take 1 tablet (150 mg total) by mouth once. Can take additional dose three days later if symptoms persist  Dispense: 1 tablet; Refill: 3  4. Well woman exam with routine gynecological exam Will follow up results of pap smear and manage accordingly. Desires STI screen. - Cytology - PAP - HIV antibody - RPR - Hepatitis C antibody - Hepatitis B surface antigen Routine preventative health maintenance measures emphasized. Please refer to After Visit Summary for other counseling recommendations.    Jaynie CollinsUGONNA  ANYANWU, MD, FACOG Attending Obstetrician & Gynecologist, [facility]

## 2017-03-27 LAB — CERVICOVAGINAL ANCILLARY ONLY
BACTERIAL VAGINITIS: POSITIVE — AB
Candida vaginitis: POSITIVE — AB
Chlamydia: NEGATIVE
NEISSERIA GONORRHEA: NEGATIVE
Trichomonas: POSITIVE — AB

## 2017-03-28 MED ORDER — METRONIDAZOLE 500 MG PO TABS: 500.0000 mg | ORAL_TABLET | Freq: Two times a day (BID) | ORAL | 3 refills | Status: DC

## 2017-03-28 NOTE — Progress Notes (Signed)
Vaginal discharge test is abnormal and showed bacterial vaginitis, yeast infection and trichomonas infection. Diflucan was already prescribed for yeast infection, now added Metronidazole for BV and trichomonas.  We need to follow up other STI tests ordered on 03/25/17 as they are still pending; may need to call [facility].  Recommend she needs to let partner(s) know about the trichomonas infection so the partner(s) can get testing and treatment. Patient and sex partner(s) should abstain from unprotected sexual activity for seven days after everyone receives appropriate treatment. Please call to inform patient of results and recommendations, and advise to pick up prescription.  Mariah CollinsUgonna Shawntina Diffee, MD

## 2017-03-28 NOTE — Addendum Note (Signed)
Addended by: Jaynie CollinsANYANWU, [place] A on: 03/28/2017 08:39 AM   Modules accepted: Orders

## 2017-03-29 LAB — CYTOLOGY - PAP
Diagnosis: NEGATIVE
HPV (WINDOPATH): NOT DETECTED

## 2017-03-30 LAB — RPR: RPR: NONREACTIVE

## 2017-03-30 LAB — HIV ANTIBODY (ROUTINE TESTING W REFLEX)

## 2017-03-30 LAB — HEPATITIS B SURFACE ANTIGEN

## 2017-03-30 LAB — HEPATITIS C ANTIBODY

## 2017-06-03 ENCOUNTER — Ambulatory Visit (INDEPENDENT_AMBULATORY_CARE_PROVIDER_SITE_OTHER): Payer: BLUE CROSS/BLUE SHIELD | Admitting: Family Medicine

## 2017-06-03 ENCOUNTER — Encounter: Payer: Self-pay | Admitting: Family Medicine

## 2017-06-03 VITALS — BP 100/60 | HR 90 | Temp 99.2°F | Resp 16 | Ht 67.5 in | Wt 253.0 lb

## 2017-06-03 DIAGNOSIS — E782 Mixed hyperlipidemia: Secondary | ICD-10-CM

## 2017-06-03 DIAGNOSIS — E669 Obesity, unspecified: Secondary | ICD-10-CM | POA: Insufficient documentation

## 2017-06-03 DIAGNOSIS — E119 Type 2 diabetes mellitus without complications: Secondary | ICD-10-CM | POA: Diagnosis not present

## 2017-06-03 DIAGNOSIS — E785 Hyperlipidemia, unspecified: Secondary | ICD-10-CM | POA: Insufficient documentation

## 2017-06-03 DIAGNOSIS — F17200 Nicotine dependence, unspecified, uncomplicated: Secondary | ICD-10-CM

## 2017-06-03 DIAGNOSIS — Z6839 Body mass index (BMI) 39.0-39.9, adult: Secondary | ICD-10-CM | POA: Diagnosis not present

## 2017-06-03 DIAGNOSIS — E66813 Obesity, class 3: Secondary | ICD-10-CM | POA: Insufficient documentation

## 2017-06-03 DIAGNOSIS — E1165 Type 2 diabetes mellitus with hyperglycemia: Secondary | ICD-10-CM | POA: Insufficient documentation

## 2017-06-03 DIAGNOSIS — R634 Abnormal weight loss: Secondary | ICD-10-CM

## 2017-06-03 DIAGNOSIS — Z23 Encounter for immunization: Secondary | ICD-10-CM

## 2017-06-03 DIAGNOSIS — IMO0002 Reserved for concepts with insufficient information to code with codable children: Secondary | ICD-10-CM | POA: Insufficient documentation

## 2017-06-03 LAB — HEMOGLOBIN A1C, FINGERSTICK

## 2017-06-03 MED ORDER — NICOTINE 21 MG/24HR TD PT24: 21.0000 mg | MEDICATED_PATCH | Freq: Every day | TRANSDERMAL | 0 refills | Status: DC

## 2017-06-03 MED ORDER — METFORMIN HCL 500 MG PO TABS: 500.0000 mg | ORAL_TABLET | Freq: Two times a day (BID) | ORAL | 3 refills | Status: DC

## 2017-06-03 NOTE — Progress Notes (Signed)
   Subjective:    Patient ID: Mariah [name], female    DOB: Jan 29, 1976, 41 y.o.   MRN: [id number]  Patient presents for New Patient (Initial Visit) and BS readings elevated (FBS 314 lbs After eating up to 400+)  Previous PCP [facility] [place]- not seen in years [facility] for [facility]-Dr. Macon Large - has DUB on Megace   Diabetes- was on metformin years ago, she checks her blood sugars randomly, this AM fasting 314. She has lost about 20lbs in the past few months . She has had blurry vision in the past   Has not been to diabetic educator   - Mother is diabetic   No history of thyroid disorder   Hyperlipidemia - diagnosed as a teenager, never prescribed medication   Smoke- 1ppd   [employer] works   Immunizations- TDAP No pneumonia vaccine   She is single, no children    Review Of Systems:  GEN- denies fatigue, fever, weight loss,weakness, recent illness HEENT- denies eye drainage, change in vision, nasal discharge, CVS- denies chest pain, palpitations RESP- denies SOB, cough, wheeze ABD- denies N/V, change in stools, abd pain GU- denies dysuria, hematuria, dribbling, incontinence MSK- denies joint pain, muscle aches, injury Neuro- denies headache, dizziness, syncope, seizure activity       Objective:    BP 100/60   Pulse 90   Temp 99.2 F (37.3 C) (Oral)   Resp 16   Ht 5' 7.5" (1.715 m)   Wt 253 lb (114.8 kg)   SpO2 97%   BMI 39.04 kg/m  GEN- NAD, alert and oriented x3 HEENT- PERRL, EOMI, non injected sclera, pink conjunctiva, MMM, oropharynx clear Neck- Supple, no thyromegaly CVS- RRR, no murmur RESP-CTAB ABD-NABS,soft,NT,ND Psych- normal affect and mood EXT- No edema Pulses- Radial, DP- 2+        Assessment & Plan:      Problem List Items Addressed This Visit      Unprioritized   Tobacco use disorder    Discussed nicotine patches, start  daily      Obesity    Send to diabetic educator       Relevant Medications   metFORMIN (GLUCOPHAGE) 500 MG tablet   Hyperlipidemia    Check lipids      Relevant Orders   Lipid panel   Diabetes mellitus without complication (HCC)    Uncontrolled DM, I think this is reason for her unexplained weight loss, A1C is > 14 Renal function is pending, planning to start metformin Also shown how to use [place] in office, start 10 units at bedtime Goal will be A1C < 7% F/U 2 weeks Meter sent to pharmacy  Check CBG TID      Relevant Medications   metFORMIN (GLUCOPHAGE) 500 MG tablet   Other Relevant Orders   CBC with Differential/Platelet   Comprehensive metabolic panel   Hemoglobin A1C, fingerstick (Completed)   Microalbumin / creatinine urine ratio   Hemoglobin A1c    Other Visit Diagnoses    Weight loss, unintentional    -  Primary   Relevant Orders   TSH   Need for diphtheria-tetanus-pertussis (Tdap) vaccine       Relevant Orders   Tdap vaccine greater than or equal to 7yo IM (Completed)      Note: This dictation was prepared with Dragon dictation along with smaller phrase technology. Any transcriptional errors that result from this process are unintentional.

## 2017-06-03 NOTE — Assessment & Plan Note (Signed)
Send to diabetic educator

## 2017-06-03 NOTE — Patient Instructions (Addendum)
Schedule your mammogram  Check blood sugar three  A day and record Start Tresiba 10 units at bedtime  Start the metformin with breakfast and dinner  Diabetic nutritionist referral  Tetanus given  F/U 2 WEEKS

## 2017-06-03 NOTE — Assessment & Plan Note (Signed)
Uncontrolled DM, I think this is reason for her unexplained weight loss, A1C is > 14 Renal function is pending, planning to start metformin Also shown how to use [place] in office, start 10 units at bedtime Goal will be A1C < 7% F/U 2 weeks Meter sent to pharmacy  Check CBG TID

## 2017-06-03 NOTE — Assessment & Plan Note (Signed)
Check lipids 

## 2017-06-03 NOTE — Assessment & Plan Note (Signed)
Discussed nicotine patches, start  daily

## 2017-06-04 LAB — CBC WITH DIFFERENTIAL/PLATELET
Basophils Absolute: 36 cells/uL (ref 0–200)
Basophils Relative: 0.4 %
Eosinophils Absolute: 273 cells/uL (ref 15–500)
Eosinophils Relative: 3 %
HCT: 43.2 % (ref 35.0–45.0)
Hemoglobin: 14.4 g/dL (ref 11.7–15.5)
Lymphs Abs: 2976 cells/uL (ref 850–3900)
MCH: 25.9 pg — AB (ref 27.0–33.0)
MCHC: 33.3 g/dL (ref 32.0–36.0)
MCV: 77.8 fL — AB (ref 80.0–100.0)
MONOS PCT: 4.5 %
MPV: 11.2 fL (ref 7.5–12.5)
NEUTROS PCT: 59.4 %
Neutro Abs: 5405 cells/uL (ref 1500–7800)
PLATELETS: 314 10*3/uL (ref 140–400)
RBC: 5.55 10*6/uL — AB (ref 3.80–5.10)
RDW: 13.3 % (ref 11.0–15.0)
TOTAL LYMPHOCYTE: 32.7 %
WBC: 9.1 10*3/uL (ref 3.8–10.8)
WBCMIX: 410 {cells}/uL (ref 200–950)

## 2017-06-04 LAB — COMPREHENSIVE METABOLIC PANEL
AG RATIO: 1.3 (calc) (ref 1.0–2.5)
ALKALINE PHOSPHATASE (APISO): 79 U/L (ref 33–115)
ALT: 9 U/L (ref 6–29)
AST: 11 U/L (ref 10–30)
Albumin: 3.8 g/dL (ref 3.6–5.1)
BILIRUBIN TOTAL: 0.4 mg/dL (ref 0.2–1.2)
BUN: 9 mg/dL (ref 7–25)
CALCIUM: 9.3 mg/dL (ref 8.6–10.2)
CHLORIDE: 102 mmol/L (ref 98–110)
CO2: 24 mmol/L (ref 20–32)
Creat: 0.74 mg/dL (ref 0.50–1.10)
GLOBULIN: 2.9 g/dL (ref 1.9–3.7)
Glucose, Bld: 322 mg/dL — ABNORMAL HIGH (ref 65–99)
Potassium: 4.4 mmol/L (ref 3.5–5.3)
Sodium: 135 mmol/L (ref 135–146)
Total Protein: 6.7 g/dL (ref 6.1–8.1)

## 2017-06-04 LAB — MICROALBUMIN / CREATININE URINE RATIO
Creatinine, Urine: 185 mg/dL (ref 20–275)
MICROALB/CREAT RATIO: 11 ug/mg{creat} (ref <30)
Microalb, Ur: 2.1 mg/dL

## 2017-06-04 LAB — LIPID PANEL
CHOLESTEROL: 247 mg/dL — AB (ref <200)
HDL: 38 mg/dL — AB (ref >50)
LDL Cholesterol (Calc): 190 mg/dL (calc) — ABNORMAL HIGH
NON-HDL CHOLESTEROL (CALC): 209 mg/dL — AB (ref <130)
Total CHOL/HDL Ratio: 6.5 (calc) — ABNORMAL HIGH (ref <5.0)
Triglycerides: 81 mg/dL (ref <150)

## 2017-06-07 LAB — HEMOGLOBIN A1C

## 2017-06-07 LAB — TSH: TSH: 1.35 m[IU]/L

## 2017-06-10 ENCOUNTER — Encounter: Payer: Self-pay | Admitting: Family Medicine

## 2017-06-17 ENCOUNTER — Ambulatory Visit (INDEPENDENT_AMBULATORY_CARE_PROVIDER_SITE_OTHER): Payer: BLUE CROSS/BLUE SHIELD | Admitting: Family Medicine

## 2017-06-17 VITALS — BP 120/62 | HR 97 | Temp 98.7°F | Resp 16 | Ht 67.5 in | Wt 257.0 lb

## 2017-06-17 DIAGNOSIS — Z6839 Body mass index (BMI) 39.0-39.9, adult: Secondary | ICD-10-CM

## 2017-06-17 DIAGNOSIS — E119 Type 2 diabetes mellitus without complications: Secondary | ICD-10-CM

## 2017-06-17 MED ORDER — METFORMIN HCL 1000 MG PO TABS: 1000.0000 mg | ORAL_TABLET | Freq: Two times a day (BID) | ORAL | 6 refills | Status: DC

## 2017-06-17 NOTE — Progress Notes (Signed)
   Subjective:    Patient ID: Mariah [name], female    DOB: 29-Sep-1975, 41 y.o.   MRN: [id number]  Patient presents for Follow-up  Patient here for interim follow-up on her diabetes mellitus. She established care 2 weeks ago at that time her A1c wasgreater than 14%. She was started on Tresiba 10 units and metformin  BID with food   CBG fasting  176-232  PM 262-340 , no hypoglycemia  her kidney function was well-preserved as well as thyroid function Cholesterol was elevated as expected LDL 190, total cholesterol 247  She has had a few headaches when she goes long. The time without eating  Review Of Systems:  GEN- denies fatigue, fever, weight loss,weakness, recent illness HEENT- denies eye drainage, change in vision, nasal discharge, CVS- denies chest pain, palpitations RESP- denies SOB, cough, wheeze ABD- denies N/V, change in stools, abd pain GU- denies dysuria, hematuria, dribbling, incontinence MSK- denies joint pain, muscle aches, injury Neuro- +headache, denies  dizziness, syncope, seizure activity       Objective:    BP 120/62   Pulse 97   Temp 98.7 F (37.1 C) (Oral)   Resp 16   Ht 5' 7.5" (1.715 m)   Wt 257 lb (116.6 kg)   SpO2 98%   BMI 39.66 kg/m  GEN- NAD, alert and oriented x3 HEENT- PERRL, EOMI, non injected sclera, pink conjunctiva, MMM, oropharynx clear Neck- Supple, no thyromegaly CVS- RRR, no murmur RESP-CTAB        Assessment & Plan:      Problem List Items Addressed This Visit      Unprioritized   Obesity   Relevant Medications   insulin degludec (TRESIBA FLEXTOUCH) 100 UNIT/ML SOPN FlexTouch Pen   metFORMIN (GLUCOPHAGE) 1000 MG tablet   Diabetes mellitus without complication (HCC) - Primary    Uncontrolled but recently started on medication. And then maximize her metformin increase urge to 500 mg in the morning 1000 at dinner for 1 week then increase tothousand milligrams twice a day we'll continue the Tresiba at 10  units Follow-up via phone in 2 weeks. Discussed the importance of eating regularly with the insulin on board. She was also given the number to call the nutrition is back to schedule her appointment. At this point I am not can address her cholesterol and also will hold on ACE inhibitorshe seems very overwhelmed with the medications checking blood sugars at this time.      Relevant Medications   insulin degludec (TRESIBA FLEXTOUCH) 100 UNIT/ML SOPN FlexTouch Pen   metFORMIN (GLUCOPHAGE) 1000 MG tablet      Note: This dictation was prepared with Dragon dictation along with smaller phrase technology. Any transcriptional errors that result from this process are unintentional.

## 2017-06-17 NOTE — Patient Instructions (Addendum)
For 1 week-Take 500nmg metformin with breakfast and then  at dinner THen  twice a day   [place] continue 10 units at bedtime  Eat regularly and drink water throughout the day  Call the nutritionist back  [phone] We will call in 2 weeks for blood sugar readings  F/U 6 weeks

## 2017-06-18 NOTE — Assessment & Plan Note (Signed)
Uncontrolled but recently started on medication. And then maximize her metformin increase urge to 500 mg in the morning 1000 at dinner for 1 week then increase tothousand milligrams twice a day we'll continue the Tresiba at 10 units Follow-up via phone in 2 weeks. Discussed the importance of eating regularly with the insulin on board. She was also given the number to call the nutrition is back to schedule her appointment. At this point I am not can address her cholesterol and also will hold on ACE inhibitorshe seems very overwhelmed with the medications checking blood sugars at this time.

## 2017-06-20 ENCOUNTER — Other Ambulatory Visit: Payer: Self-pay | Admitting: Family Medicine

## 2017-06-20 DIAGNOSIS — Z1231 Encounter for screening mammogram for malignant neoplasm of breast: Secondary | ICD-10-CM

## 2017-07-03 ENCOUNTER — Telehealth: Payer: Self-pay | Admitting: *Deleted

## 2017-07-03 NOTE — Telephone Encounter (Signed)
Call placed to patient and patient made aware.  

## 2017-07-03 NOTE — Telephone Encounter (Signed)
-----   Message from Salley ScarletKawanta F [place], MD sent at 07/02/2017 11:53 AM EDT ----- Regarding: FW: F/U CBG Call and see what CBG are running/  ----- Message ----- From: Salley Scarleturham, Kawanta F, MD Sent: 07/02/2017 To: Salley ScarletKawanta F [place], MD Subject: F/U CBG

## 2017-07-03 NOTE — Telephone Encounter (Signed)
Patient reports CBG readings are as follows:   AM Noon PM 10/16   156 266 10/17  169  267 10/18  164  206 10/19  185 176 226 10/20  228  143 10/21  169 153 218 10/22  163 155 194 10/23  158 141 141 10/24  140 110 158 10/25  188 135 212 10/26  18/3 124 156 10/27  134 136 124 10/28  146  119 10/29  133  133 10/30  159 137 10/31  144  Patient reports that she is taking Metformin 1000mg  PO BID and Tresiba 10u SQ QHS.   MD please advise.

## 2017-07-03 NOTE — Telephone Encounter (Signed)
Blood sugar looks good No change to Doses of medications F/u in office as previous scheduled

## 2017-07-09 ENCOUNTER — Ambulatory Visit: Payer: BLUE CROSS/BLUE SHIELD

## 2017-07-10 ENCOUNTER — Ambulatory Visit
Admission: RE | Admit: 2017-07-10 | Discharge: 2017-07-10 | Disposition: A | Discharge status: Home / Self Care | Payer: BLUE CROSS/BLUE SHIELD | Source: Ambulatory Visit | Attending: Family Medicine | Admitting: Family Medicine

## 2017-07-10 DIAGNOSIS — Z1231 Encounter for screening mammogram for malignant neoplasm of breast: Secondary | ICD-10-CM

## 2017-07-29 ENCOUNTER — Encounter: Payer: Self-pay | Admitting: Nutrition

## 2017-07-29 ENCOUNTER — Ambulatory Visit: Payer: BLUE CROSS/BLUE SHIELD | Admitting: Family Medicine

## 2017-07-29 ENCOUNTER — Encounter: Payer: BLUE CROSS/BLUE SHIELD | Attending: Family Medicine | Admitting: Nutrition

## 2017-07-29 DIAGNOSIS — E7800 Pure hypercholesterolemia, unspecified: Secondary | ICD-10-CM

## 2017-07-29 DIAGNOSIS — E119 Type 2 diabetes mellitus without complications: Secondary | ICD-10-CM

## 2017-07-29 DIAGNOSIS — F17200 Nicotine dependence, unspecified, uncomplicated: Secondary | ICD-10-CM

## 2017-07-29 NOTE — Patient Instructions (Signed)
Goals 1 Follow My Plate 2. Eat 3 carb choics per meal 3. Eat meals on time 4 Cut out snacks 5. Drink only water  Increase fresh fruits and vegetables daily Test blood sugars twice a day. Exercise 60 minutes 4 times per week.

## 2017-07-29 NOTE — Progress Notes (Signed)
Diabetes Self-Management Education  Visit Type: First/Initial  Appt. Start Time: 1330 Appt. End Time: 1500  07/29/2017  Ms. Eilyn Polack, identified by name and date of birth, is a 41 y.o. female with a diagnosis of Diabetes: Type 2. . Newly diagnosed 2 months ago. Tresiba 10 units with Metformin 1000 mg BID.   ASSESSMENT  Height  (1.702 m), weight 264 lb (119.7 kg). Body mass index is 41.35 kg/m. Lipid Panel     Component Value Date/Time   CHOL 247 (H) 06/03/2017 1540   TRIG 81 06/03/2017 1540   HDL 38 (L) 06/03/2017 1540   CHOLHDL 6.5 (H) 06/03/2017 1540   .Results for SHARIAN, DELIA (MRN [id number]) as of 07/29/2017 15:05  Ref. Range 06/03/2017 16:08  Hgb A1C (fingerstick) Latest Ref Range: <6.0 % OF TOTAL HGB >14.0 (H)   CMP Latest Ref Rng & Units 06/03/2017  Glucose 65 - 99 mg/dL 696(E)  BUN 7 - 25 mg/dL 9  Creatinine 9.52 - 8.41 mg/dL 3.24  Sodium 401 - 027 mmol/L 135  Potassium 3.5 - 5.3 mmol/L 4.4  Chloride 98 - 110 mmol/L 102  CO2 20 - 32 mmol/L 24  Calcium 8.6 - 10.2 mg/dL 9.3  Total Protein 6.1 - 8.1 g/dL 6.7  Total Bilirubin 0.2 - 1.2 mg/dL 0.4  AST 10 - 30 U/L 11  ALT 6 - 29 U/L 9    Diabetes Self-Management Education - 07/29/17 1350      Visit Information   Visit Type  First/Initial      Initial Visit   Diabetes Type  Type 2    Are you currently following a meal plan?  No    Are you taking your medications as prescribed?  Yes      Health Coping   How would you rate your overall health?  Good      Psychosocial Assessment   Patient Belief/Attitude about Diabetes  Motivated to manage diabetes    Self-care barriers  None    Self-management support  Family    Other persons present  Patient    Patient Concerns  Nutrition/Meal planning;Medication;Monitoring;Healthy Lifestyle;Problem Solving;Glycemic Control;Weight Control    Special Needs  None    Preferred Learning Style  Visual;Auditory    Learning Readiness  Ready    How often do  you need to have someone help you when you read instructions, pamphlets, or other written materials from your doctor or pharmacy?  1 - Never    What is the last grade level you completed in school?  12      Pre-Education Assessment   Patient understands the diabetes disease and treatment process.  Needs Instruction    Patient understands incorporating nutritional management into lifestyle.  Needs Instruction    Patient undertands incorporating physical activity into lifestyle.  Needs Instruction    Patient understands using medications safely.  Needs Instruction    Patient understands monitoring blood glucose, interpreting and using results  Needs Instruction    Patient understands prevention, detection, and treatment of acute complications.  Needs Instruction    Patient understands prevention, detection, and treatment of chronic complications.  Needs Instruction    Patient understands how to develop strategies to address psychosocial issues.  Needs Instruction    Patient understands how to develop strategies to promote health/change behavior.  Needs Instruction      Complications   Last HgB A1C per patient/outside source  14 %    How often do you check your blood sugar?  3-4 times/day    Fasting Blood glucose range (mg/dL)  [phone]    Postprandial Blood glucose range (mg/dL)  [phone]    Number of hypoglycemic episodes per month  0    Number of hyperglycemic episodes per week  1    Can you tell when your blood sugar is high?  Yes    What do you do if your blood sugar is high?  nothing    Have you had a dilated eye exam in the past 12 months?  No    Have you had a dental exam in the past 12 months?  Yes    Are you checking your feet?  No      Dietary Intake   Breakfast  skipped or mini chicken patty;  [place]key sausage egg and flat bread, water    Lunch  Pasta afredo 1/2 cup,  Water    Snack (afternoon)  chocolate chip cookie    Dinner  Afredo pasta 1 cup, chicken parmesean, 1 bread stick,  water    Snack (evening)  casheews    Beverage(s)  water      Exercise   Exercise Type  ADL's      Patient Education   Previous Diabetes Education  No    Disease state   Definition of diabetes, type 1 and 2, and the diagnosis of diabetes;Explored patient's options for treatment of their diabetes;Factors that contribute to the development of diabetes    Nutrition management   Role of diet in the treatment of diabetes and the relationship between the three main macronutrients and blood glucose level;Food label reading, portion sizes and measuring food.;Carbohydrate counting;Meal timing in regards to the patients' current diabetes medication.;Reviewed blood glucose goals for pre and post meals and how to evaluate the patients' food intake on their blood glucose level.;Information on hints to eating out and maintain blood glucose control.    Physical activity and exercise   Helped patient identify appropriate exercises in relation to his/her diabetes, diabetes complications and other health issue.;Role of exercise on diabetes management, blood pressure control and cardiac health.    Medications  Reviewed patients medication for diabetes, action, purpose, timing of dose and side effects.;Reviewed medication adjustment guidelines for hyperglycemia and sick days.    Monitoring  Taught/evaluated SMBG meter.;Purpose and frequency of SMBG.;Daily foot exams;Taught/discussed recording of test results and interpretation of SMBG.    Acute complications  Taught treatment of hypoglycemia - the 15 rule.    Chronic complications  Relationship between chronic complications and blood glucose control;Lipid levels, blood glucose control and heart disease;Assessed and discussed foot care and prevention of foot problems;Identified and discussed with patient  current chronic complications;Nephropathy, what it is, prevention of, the use of ACE, ARB's and early detection of through urine microalbumia.;Dental care;Retinopathy  and reason for yearly dilated eye exams;Reviewed with patient heart disease, higher risk of, and prevention    Psychosocial adjustment  Role of stress on diabetes;Helped patient identify a support system for diabetes management    Personal strategies to promote health  Review risk of smoking and offered smoking cessation      Individualized Goals (developed by patient)   Nutrition  General guidelines for healthy choices and portions discussed    Physical Activity  Exercise 3-5 times per week;60 minutes per day    Medications  take my medication as prescribed    Monitoring   test my blood glucose as discussed    Reducing Risk  examine blood glucose patterns;stop smoking;do foot  checks daily;get labs drawn      Post-Education Assessment   Patient understands the diabetes disease and treatment process.  Needs Review    Patient understands incorporating nutritional management into lifestyle.  Needs Review    Patient undertands incorporating physical activity into lifestyle.  Needs Review    Patient understands using medications safely.  Needs Review    Patient understands monitoring blood glucose, interpreting and using results  Needs Review    Patient understands prevention, detection, and treatment of acute complications.  Needs Review    Patient understands prevention, detection, and treatment of chronic complications.  Needs Review    Patient understands how to develop strategies to address psychosocial issues.  Needs Review    Patient understands how to develop strategies to promote health/change behavior.  Needs Review      Outcomes   Expected Outcomes  Demonstrated interest in learning. Expect positive outcomes    Future DMSE  4-6 wks    Program Status  Completed       Individualized Plan for Diabetes Self-Management Training:   Learning Objective:  Patient will have a greater understanding of diabetes self-management. Patient education plan is to attend individual and/or group  sessions per assessed needs and concerns.   Plan:   Patient Instructions  Goals 1 Follow My Plate 2. Eat 3 carb choics per meal 3. Eat meals on time 4 Cut out snacks 5. Drink only water  Increase fresh fruits and vegetables daily Test blood sugars twice a day. Exercise 60 minutes 4 times per week.    Expected Outcomes:  Demonstrated interest in learning. Expect positive outcomes  Education material provided: Living Well with Diabetes, Food label handouts, A1C conversion sheet, Meal plan card, My Plate and Carbohydrate counting sheet  If problems or questions, patient to contact team via:  Phone, email  Future DSME appointment: 4-6 wks

## 2017-08-08 ENCOUNTER — Other Ambulatory Visit: Payer: Self-pay | Admitting: *Deleted

## 2017-08-08 ENCOUNTER — Telehealth: Payer: Self-pay | Admitting: *Deleted

## 2017-08-08 ENCOUNTER — Other Ambulatory Visit: Payer: Self-pay | Admitting: Family Medicine

## 2017-08-08 MED ORDER — METFORMIN HCL 1000 MG PO TABS: 1000.0000 mg | ORAL_TABLET | Freq: Two times a day (BID) | ORAL | 3 refills | Status: DC

## 2017-08-08 MED ORDER — INSULIN DEGLUDEC 100 UNIT/ML ~~LOC~~ SOPN: 8.0000 [IU] | PEN_INJECTOR | Freq: Every day | SUBCUTANEOUS | Status: DC

## 2017-08-08 MED ORDER — EMPAGLIFLOZIN 25 MG PO TABS: 25.0000 mg | ORAL_TABLET | Freq: Every day | ORAL | 0 refills | Status: DC

## 2017-08-08 NOTE — Telephone Encounter (Signed)
Received FSBS results.    MD reviewed and recommendations are as follows: Begin Jardiance 25mg  PO QD.  Decrease Tresiba to 8U SQ QD.   Call placed to patient and patient made aware. Samples of Jardiance given. Medication list updated.

## 2017-09-05 ENCOUNTER — Ambulatory Visit: Payer: BLUE CROSS/BLUE SHIELD | Admitting: Nutrition

## 2017-09-12 ENCOUNTER — Telehealth: Payer: Self-pay | Admitting: *Deleted

## 2017-09-12 NOTE — Telephone Encounter (Signed)
Received call from patient.   Reports that she has been out of [place]iba for a few days and has not started Jardiance at this time. FSBS readings are as follows:   AM Noon PM 12/6  164  127 12/7  157  136 12/8    122 12/9 12/10    124(midnight) 12/11  138 127 99 12/12  120 12/13  176 12/14  113  117 12/15  163 12/16 12/17  140 113 207 12/18  112 107 148 12/19   12/20  150  141  12/21 12/22   117 154 12/23  135 12/24    126   **Missed a few days** 12/29  200  186 12/30   127 12/31139  1/1   1/2  154  155 1/3   134 1/4    171 1/5   1/6 1/7  171 1/8  161 1/9  150 1/10  133  MD to be made aware.

## 2017-09-13 MED ORDER — INSULIN DEGLUDEC 100 UNIT/ML ~~LOC~~ SOPN: 8.0000 [IU] | PEN_INJECTOR | Freq: Every day | SUBCUTANEOUS | 0 refills | Status: DC

## 2017-09-13 MED ORDER — EMPAGLIFLOZIN 25 MG PO TABS: 25.0000 mg | ORAL_TABLET | Freq: Every day | ORAL | 0 refills | Status: DC

## 2017-09-13 NOTE — Telephone Encounter (Signed)
Call placed to patient and patient made aware.   Appointment scheduled.  

## 2017-09-13 NOTE — Telephone Encounter (Signed)
I am trying to taper her off insulin, restart the tresiba and get the jardiance Needs OV for labs as well

## 2017-09-17 ENCOUNTER — Encounter: Payer: Self-pay | Admitting: Family Medicine

## 2017-10-02 ENCOUNTER — Encounter: Payer: Self-pay | Admitting: Family Medicine

## 2017-10-02 ENCOUNTER — Ambulatory Visit (INDEPENDENT_AMBULATORY_CARE_PROVIDER_SITE_OTHER): Payer: BLUE CROSS/BLUE SHIELD | Admitting: Family Medicine

## 2017-10-02 ENCOUNTER — Other Ambulatory Visit: Payer: Self-pay

## 2017-10-02 VITALS — BP 122/80 | HR 78 | Temp 98.3°F | Resp 18 | Ht 67.5 in | Wt 266.0 lb

## 2017-10-02 DIAGNOSIS — Z6839 Body mass index (BMI) 39.0-39.9, adult: Secondary | ICD-10-CM

## 2017-10-02 DIAGNOSIS — E119 Type 2 diabetes mellitus without complications: Secondary | ICD-10-CM | POA: Diagnosis not present

## 2017-10-02 LAB — HEMOGLOBIN A1C, FINGERSTICK: Hgb A1C (fingerstick): 6.8 % OF TOTAL HGB — ABNORMAL HIGH (ref <6.0)

## 2017-10-02 NOTE — Progress Notes (Signed)
   Subjective:    Patient ID: Mariah [name], female    DOB: 10/24/1975, 42 y.o.   MRN: [id number]  Patient presents for Follow-up (is not fasting)  Pt here to f/u diabetes mellitus. Last A1C > 14% at diagnosis in December CBG recently fasting 119-143  Evening 121-181 Currently on  Metformin 1000mg  twice, took Jardiance but had red splotcy areas on face and had pain in side so she stopped, still taking 8 units of [place]iba   She has been trying to watch her carbs, last ate at 10: 30 am this morning   [facility] [employer]  Left foot pain on the heel for weeks, while walking okay but after resting then putting weight becaomes more painful ,she has called the foot doctor      Review Of Systems:  GEN- denies fatigue, fever, weight loss,weakness, recent illness HEENT- denies eye drainage, change in vision, nasal discharge, CVS- denies chest pain, palpitations RESP- denies SOB, cough, wheeze ABD- denies N/V, change in stools, abd pain GU- denies dysuria, hematuria, dribbling, incontinence MSK- + joint pain, denies  muscle aches, injury Neuro- denies headache, dizziness, syncope, seizure activity       Objective:    BP 122/80   Pulse 78   Temp 98.3 F (36.8 C) (Oral)   Resp 18   Ht 5' 7.5" (1.715 m)   Wt 266 lb (120.7 kg)   SpO2 98%   BMI 41.05 kg/m  GEN- NAD, alert and oriented x3 HEENT- PERRL, EOMI, non injected sclera, pink conjunctiva, MMM, oropharynx clear CVS- RRR, no murmur RESP-CTAB EXT- No edema, TTP left heel, NT at plantar insertion, mild callus bilat  Pulses- Radial, DP- 2+        Assessment & Plan:      Problem List Items Addressed This Visit      Unprioritized   Obesity   Diabetes mellitus without complication (HCC) - Primary    A1C done returned at  6.8%, which is excellent. Will plan to switch her over to orals and titrate off insulin Given sample of Invokana 100mg  with the metformin Decrease tresiba to 5 units  Call in 2 weeks with  blood sugar readings If fasting are below 150 will D/C insulin   Continue to work on diet, lower her carbs  F/U podiatry I believe she may have a heel spur      Relevant Orders   CBC with Differential/Platelet (Completed)   Comprehensive metabolic panel (Completed)   Hemoglobin A1C, fingerstick (Completed)   HM DIABETES FOOT EXAM (Completed)      Note: This dictation was prepared with Dragon dictation along with smaller phrase technology. Any transcriptional errors that result from this process are unintentional.

## 2017-10-02 NOTE — Patient Instructions (Signed)
Your A1C was 6.8%- Great work! Decrease Tresiba to 5 units  Continue metformin Take the invokana once a day  Call with CBG readings in 2 weeks F/U 3 months

## 2017-10-03 ENCOUNTER — Encounter: Payer: Self-pay | Admitting: *Deleted

## 2017-10-03 ENCOUNTER — Encounter: Payer: Self-pay | Admitting: Family Medicine

## 2017-10-03 LAB — CBC WITH DIFFERENTIAL/PLATELET
BASOS PCT: 0.4 %
Basophils Absolute: 42 cells/uL (ref 0–200)
EOS PCT: 3.5 %
Eosinophils Absolute: 371 cells/uL (ref 15–500)
HCT: 39.3 % (ref 35.0–45.0)
HEMOGLOBIN: 13 g/dL (ref 11.7–15.5)
Lymphs Abs: 4060 cells/uL — ABNORMAL HIGH (ref 850–3900)
MCH: 25.5 pg — AB (ref 27.0–33.0)
MCHC: 33.1 g/dL (ref 32.0–36.0)
MCV: 77.2 fL — ABNORMAL LOW (ref 80.0–100.0)
MONOS PCT: 3.7 %
MPV: 9.9 fL (ref 7.5–12.5)
NEUTROS ABS: 5735 {cells}/uL (ref 1500–7800)
Neutrophils Relative %: 54.1 %
PLATELETS: 390 10*3/uL (ref 140–400)
RBC: 5.09 10*6/uL (ref 3.80–5.10)
RDW: 13.1 % (ref 11.0–15.0)
TOTAL LYMPHOCYTE: 38.3 %
WBC mixed population: 392 cells/uL (ref 200–950)
WBC: 10.6 10*3/uL (ref 3.8–10.8)

## 2017-10-03 LAB — COMPREHENSIVE METABOLIC PANEL
AG RATIO: 1.7 (calc) (ref 1.0–2.5)
ALT: 7 U/L (ref 6–29)
AST: 11 U/L (ref 10–30)
Albumin: 4.3 g/dL (ref 3.6–5.1)
Alkaline phosphatase (APISO): 64 U/L (ref 33–115)
BUN: 14 mg/dL (ref 7–25)
CHLORIDE: 104 mmol/L (ref 98–110)
CO2: 22 mmol/L (ref 20–32)
CREATININE: 0.7 mg/dL (ref 0.50–1.10)
Calcium: 9.5 mg/dL (ref 8.6–10.2)
GLOBULIN: 2.6 g/dL (ref 1.9–3.7)
GLUCOSE: 91 mg/dL (ref 65–99)
Potassium: 4.1 mmol/L (ref 3.5–5.3)
SODIUM: 136 mmol/L (ref 135–146)
TOTAL PROTEIN: 6.9 g/dL (ref 6.1–8.1)
Total Bilirubin: 0.3 mg/dL (ref 0.2–1.2)

## 2017-10-03 NOTE — Assessment & Plan Note (Signed)
A1C done returned at  6.8%, which is excellent. Will plan to switch her over to orals and titrate off insulin Given sample of Invokana 100mg  with the metformin Decrease tresiba to 5 units  Call in 2 weeks with blood sugar readings If fasting are below 150 will D/C insulin   Continue to work on diet, lower her carbs  F/U podiatry I believe she may have a heel spur

## 2017-10-04 ENCOUNTER — Ambulatory Visit: Payer: Self-pay | Admitting: Family Medicine

## 2017-11-04 ENCOUNTER — Other Ambulatory Visit: Payer: Self-pay | Admitting: *Deleted

## 2017-11-04 MED ORDER — INSULIN DEGLUDEC 100 UNIT/ML ~~LOC~~ SOPN: 8.0000 [IU] | PEN_INJECTOR | Freq: Every day | SUBCUTANEOUS | 0 refills | Status: DC

## 2017-11-04 MED ORDER — INSULIN DEGLUDEC 100 UNIT/ML ~~LOC~~ SOPN: 5.0000 [IU] | PEN_INJECTOR | Freq: Every day | SUBCUTANEOUS | 0 refills | Status: DC

## 2017-11-05 ENCOUNTER — Telehealth: Payer: Self-pay | Admitting: Family Medicine

## 2017-11-05 MED ORDER — INSULIN PEN NEEDLE 32G X 6 MM MISC: 1 refills | Status: DC

## 2017-11-05 NOTE — Telephone Encounter (Signed)
Prescription sent to pharmacy.

## 2017-11-05 NOTE — Telephone Encounter (Signed)
Pt needs pen needles for her tresiba sent [facility] [address]

## 2017-11-06 ENCOUNTER — Ambulatory Visit (INDEPENDENT_AMBULATORY_CARE_PROVIDER_SITE_OTHER): Payer: BLUE CROSS/BLUE SHIELD | Admitting: Family Medicine

## 2017-11-06 ENCOUNTER — Encounter: Payer: Self-pay | Admitting: Family Medicine

## 2017-11-06 ENCOUNTER — Other Ambulatory Visit: Payer: Self-pay

## 2017-11-06 VITALS — BP 128/74 | HR 82 | Temp 98.9°F | Resp 16 | Ht 67.5 in | Wt 263.0 lb

## 2017-11-06 DIAGNOSIS — J069 Acute upper respiratory infection, unspecified: Secondary | ICD-10-CM | POA: Diagnosis not present

## 2017-11-06 MED ORDER — GUAIFENESIN-CODEINE 100-10 MG/5ML PO SOLN: 5.0000 mL | Freq: Four times a day (QID) | ORAL | 0 refills | Status: DC | PRN

## 2017-11-06 MED ORDER — AZITHROMYCIN 250 MG PO TABS: ORAL_TABLET | ORAL | 0 refills | Status: DC

## 2017-11-06 NOTE — Progress Notes (Signed)
   Subjective:    Patient ID: Mariah [name], female    DOB: 03/14/1976, 42 y.o.   MRN: [id number]  Patient presents for Illness (x4 days- nonprodictive cough, ears itchy, post nasal drip, nasal drainage)  Cough with congestion, itchy ears, nasal drainage, irritated throat.  Minimal production with cough. For past 4-5 days. No fever.   Took immune boost, mucinex, claritin  Cough worse at night.   No vomiting, no diarrhea. Decreased appetite. Not drinking many fluids    DM-  CBG fasting 102-130's fasting, Evening 99-161   Tresiba  5 units , Metformin ,   Review Of Systems:  GEN- denies fatigue, fever, weight loss,weakness, recent illness HEENT- denies eye drainage, change in vision, +nasal discharge, CVS- denies chest pain, palpitations RESP- denies SOB, +cough, wheeze ABD- denies N/V, change in stools, abd pain GU- denies dysuria, hematuria, dribbling, incontinence MSK- denies joint pain, muscle aches, injury Neuro- denies headache, dizziness, syncope, seizure activity       Objective:    BP 128/74   Pulse 82   Temp 98.9 F (37.2 C) (Oral)   Resp 16   Ht 5' 7.5" (1.715 m)   Wt 263 lb (119.3 kg)   SpO2 98%   BMI 40.58 kg/m  GEN- NAD, alert and oriented x3 HEENT- PERRL, EOMI, non injected sclera, pink conjunctiva, MMM, oropharynx mild injection, TM clear bilat no effusion,  No  maxillary sinus tenderness, inflammed turbinates,  Nasal drainage  Neck- Supple, no LAD CVS- RRR, no murmur RESP-congestion  bilat upper airway EXT- No edema Pulses- Radial 2+        Assessment & Plan:      Problem List Items Addressed This Visit    None    Visit Diagnoses    Acute URI    -  Primary   More upper symptoms but sinuses are inflammed, minimal improvement with OTC meds. Diabetes puts her at risk for progression of illness. As she is also traveling given zpak, robitussin codiene, depo medrol 20mg  Im to help inflammation continue anti-histamine Keep an eye on glucose  with illness, steroid will increase for about 2 days   Relevant Medications   azithromycin (ZITHROMAX) 250 MG tablet      Note: This dictation was prepared with Dragon dictation along with smaller phrase technology. Any transcriptional errors that result from this process are unintentional.

## 2017-11-06 NOTE — Patient Instructions (Signed)
F/U as before  

## 2017-11-07 MED ORDER — METHYLPREDNISOLONE ACETATE 40 MG/ML IJ SUSP
20.0000 mg | Freq: Once | INTRAMUSCULAR | Status: AC
  Administered 2017-11-06: 20 mg via INTRAMUSCULAR

## 2017-11-07 NOTE — Addendum Note (Signed)
Addended by: Phillips OdorSIX, CHRISTINA H on: 11/07/2017 11:48 AM   Modules accepted: Orders

## 2018-01-15 ENCOUNTER — Encounter: Payer: Self-pay | Admitting: Radiology

## 2018-02-11 ENCOUNTER — Encounter: Payer: Self-pay | Admitting: Family Medicine

## 2018-02-11 ENCOUNTER — Ambulatory Visit (INDEPENDENT_AMBULATORY_CARE_PROVIDER_SITE_OTHER): Payer: BLUE CROSS/BLUE SHIELD | Admitting: Family Medicine

## 2018-02-11 ENCOUNTER — Other Ambulatory Visit: Payer: Self-pay

## 2018-02-11 VITALS — BP 124/78 | HR 102 | Temp 98.3°F | Resp 18 | Ht 67.5 in | Wt 272.6 lb

## 2018-02-11 DIAGNOSIS — R0600 Dyspnea, unspecified: Secondary | ICD-10-CM | POA: Diagnosis not present

## 2018-02-11 DIAGNOSIS — J45901 Unspecified asthma with (acute) exacerbation: Secondary | ICD-10-CM

## 2018-02-11 DIAGNOSIS — R05 Cough: Secondary | ICD-10-CM | POA: Diagnosis not present

## 2018-02-11 MED ORDER — HYDROCODONE-HOMATROPINE 5-1.5 MG/5ML PO SYRP: 5.0000 mL | ORAL_SOLUTION | Freq: Three times a day (TID) | ORAL | 0 refills | Status: DC | PRN

## 2018-02-11 MED ORDER — PREDNISONE 20 MG PO TABS: 40.0000 mg | ORAL_TABLET | Freq: Every day | ORAL | 0 refills | Status: DC

## 2018-02-11 MED ORDER — PREDNISONE 20 MG PO TABS: 40.0000 mg | ORAL_TABLET | Freq: Every day | ORAL | 0 refills | Status: AC

## 2018-02-11 MED ORDER — METHYLPREDNISOLONE ACETATE 40 MG/ML IJ SUSP
40.0000 mg | Freq: Once | INTRAMUSCULAR | Status: AC
  Administered 2018-02-11: 40 mg via INTRAMUSCULAR

## 2018-02-11 MED ORDER — IPRATROPIUM-ALBUTEROL 0.5-2.5 (3) MG/3ML IN SOLN
3.0000 mL | Freq: Once | RESPIRATORY_TRACT | Status: AC
  Administered 2018-02-11: 3 mL via RESPIRATORY_TRACT

## 2018-02-11 MED ORDER — BENZONATATE 100 MG PO CAPS: 100.0000 mg | ORAL_CAPSULE | Freq: Three times a day (TID) | ORAL | 0 refills | Status: DC | PRN

## 2018-02-11 MED ORDER — ALBUTEROL SULFATE (2.5 MG/3ML) 0.083% IN NEBU
2.5000 mg | INHALATION_SOLUTION | Freq: Once | RESPIRATORY_TRACT | Status: AC
  Administered 2018-02-11: 2.5 mg via RESPIRATORY_TRACT

## 2018-02-11 MED ORDER — ALBUTEROL SULFATE 108 (90 BASE) MCG/ACT IN AEPB: 2.0000 | INHALATION_SPRAY | Freq: Four times a day (QID) | RESPIRATORY_TRACT | 0 refills | Status: DC | PRN

## 2018-02-11 NOTE — Progress Notes (Signed)
Patient ID: Mariah [name], female    DOB: December 08, 1975, 42 y.o.   MRN: [id number]  PCP: Salley Scarlet, MD  Chief Complaint  Patient presents with  . Cough    symptoms started sunday  . Shortness of Breath    symptoms started last night     Subjective:   Mariah [name] is a 42 y.o. female, presents to clinic with CC of 3 days of worsening cough with chest tightness.  It began after cleaning in her house and leaving windows open all day, she fell asleep on the couch when she woke up she had a wheezy type cough.  She is unsure if it is chemicals in the cleaning agents or leaving her doors open but it does feel similar to her past medical history of asthmatic bronchitis.  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  Like someone is holding her chest tight not letting her pull the air in.  She had only 1 or 2 instances of productive cough, this morning she had some yellow sputum, she denies any continuous nasal symptoms, sneezing, discharge, stuffiness, sinus pain or congestion.  Her throat is irritated today only secondary to coughing.  She is current smoker, but her coughing is been so severe she is been unable to smoke for the past 3 days.  She has had no fever, chills, sweats, chest pain, lower extremity edema   Patient Active Problem List   Diagnosis Date Noted  . Hyperlipidemia 06/03/2017  . Diabetes mellitus without complication (HCC) 06/03/2017  . Obesity 06/03/2017  . Tobacco use disorder 06/03/2017  . Simple endometrial hyperplasia without atypia 03/15/2011     Prior to Admission medications   Medication Sig Start Date End Date Taking? Authorizing Provider  ibuprofen (ADVIL,MOTRIN) 600 MG tablet Take 1 tablet (600 mg total) by mouth every 6 (six) hours as needed. 08/08/15  Yes Ivery Quale, PA-C  insulin degludec (TRESIBA FLEXTOUCH)  100 UNIT/ML SOPN FlexTouch Pen Inject 0.05 mLs (5 Units total) into the skin daily at 10 pm. 11/04/17  Yes [place], Velna Hatchet, MD  Insulin Pen Needle (BD PEN NEEDLE MICRO U/F) 32G X 6 MM MISC Use as directed to inject insulin QD. 11/05/17  Yes Cleary, Velna Hatchet, MD  metFORMIN (GLUCOPHAGE) 1000 MG tablet Take 1 tablet (1,000 mg total) by mouth 2 (two) times daily with a meal. 08/08/17  Yes Austin, Velna Hatchet, MD     No Known Allergies   Family History  Problem Relation Age of Onset  . Diabetes Mother   . Heart disease Mother   . Hyperlipidemia Mother   . Diabetes Maternal Grandmother   . Diabetes Maternal Grandfather   . Cancer Paternal Grandmother   . Breast cancer Paternal Grandmother      Social History   Socioeconomic History  . Marital status: Single    Spouse name: Not on file  . Number of children: Not on file  . Years of education: Not on file  . Highest education level: Not on file  Occupational History  . Not on file  Social Needs  . Financial resource strain: Not on file  . Food insecurity:    Worry: Not on file    Inability: Not on file  . Transportation needs:    Medical: Not on file    Non-medical: Not  on file  Tobacco Use  . Smoking status: Current Every Day Smoker    Packs/day: 1.00    Years: 14.00    Pack years: 14.00    Types: Cigarettes  . Smokeless tobacco: Never Used  Substance and Sexual Activity  . Alcohol use: Yes    Alcohol/week: 0.0 oz    Comment: occassonially  . Drug use: No  . Sexual activity: Not Currently  Lifestyle  . Physical activity:    Days per week: Not on file    Minutes per session: Not on file  . Stress: Not on file  Relationships  . Social connections:    Talks on phone: Not on file    Gets together: Not on file    Attends religious service: Not on file    Active member of club or organization: Not on file    Attends meetings of clubs or organizations: Not on file    Relationship status: Not on file  . Intimate partner  violence:    Fear of current or ex partner: Not on file    Emotionally abused: Not on file    Physically abused: Not on file    Forced sexual activity: Not on file  Other Topics Concern  . Not on file  Social History Narrative  . Not on file     Review of Systems  Constitutional: Negative.  Negative for activity change, appetite change, chills, diaphoresis, fatigue, fever and unexpected weight change.  HENT: Positive for sore throat. Negative for congestion, nosebleeds, postnasal drip, rhinorrhea, sinus pressure, sinus pain, sneezing, tinnitus, trouble swallowing and voice change.   Eyes: Negative.   Respiratory: Positive for cough, chest tightness, shortness of breath and wheezing. Negative for apnea, choking and stridor.   Cardiovascular: Negative for chest pain, palpitations and leg swelling.  Gastrointestinal: Negative.   Endocrine: Negative.   Genitourinary: Negative.   Musculoskeletal: Negative.   Skin: Negative.  Negative for color change, pallor, rash and wound.  Allergic/Immunologic: Positive for environmental allergies. Negative for food allergies and immunocompromised state.  Neurological: Negative.   Hematological: Negative.   Psychiatric/Behavioral: Negative.   All other systems reviewed and are negative.      Objective:    Vitals:   02/11/18 1203  BP: 124/78  Pulse: (!) 102  Resp: 18  Temp: 98.3 F (36.8 C)  TempSrc: Oral  SpO2: 96%  Weight: 272 lb 9.6 oz (123.7 kg)  Height: 5' 7.5" (1.715 m)      Physical Exam  Constitutional: She is oriented to person, place, and time. She appears well-developed and well-nourished.  Non-toxic appearance. She does not appear ill. No distress.  HENT:  Head: Normocephalic and atraumatic.  Right Ear: External ear normal.  Left Ear: External ear normal.  Nose: Nose normal.  Mouth/Throat: Uvula is midline, oropharynx is clear and moist and mucous membranes are normal. No oropharyngeal exudate or posterior oropharyngeal  edema.  Eyes: Pupils are equal, round, and reactive to light. Conjunctivae, EOM and lids are normal.  Neck: Normal range of motion and phonation normal. Neck supple. No JVD present. No tracheal deviation present. No thyromegaly present.  Cardiovascular: Regular rhythm, normal heart sounds, intact distal pulses and normal pulses.  No extrasystoles are present. Tachycardia present. PMI is not displaced. Exam reveals no gallop, no friction rub and no decreased pulses.  No murmur heard. Pulses:      Radial pulses are 2+ on the right side, and 2+ on the left side.  Dorsalis pedis pulses are 2+ on the right side, and 2+ on the left side.       Posterior tibial pulses are 2+ on the right side, and 2+ on the left side.  No LE edema  Pulmonary/Chest: Effort normal. No accessory muscle usage or stridor. No respiratory distress. She has decreased breath sounds in the right lower field and the left lower field. She has wheezes in the right upper field, the right middle field, the right lower field, the left upper field, the left middle field and the left lower field. She has no rhonchi. She has no rales. She exhibits no tenderness.  Abdominal: Soft. Normal appearance and bowel sounds are normal. She exhibits no distension. There is no tenderness. There is no rebound and no guarding.  Musculoskeletal: Normal range of motion. She exhibits no edema or deformity.  Lymphadenopathy:    She has no cervical adenopathy.  Neurological: She is alert and oriented to person, place, and time. She exhibits normal muscle tone. Coordination and gait normal.  Skin: Skin is warm, dry and intact. Capillary refill takes less than 2 seconds. No rash noted. She is not diaphoretic. No pallor.  Psychiatric: She has a normal mood and affect. Her speech is normal and behavior is normal.  Nursing note and vitals reviewed.    Ambulatory Pulsoximetry:  SpO2:  94-96%  HR:  98-124  Pt became short of breath at the end of the third  lab around clinic.  Patient's lungs were reevaluated after first breathing treatment, DuoNeb, in clinic, continued to have diffuse and tight inspiratory and expiratory wheeze in all lung fields with diminished breath sounds bilaterally at the bases  1:27 PM Patient reevaluated again after additional albuterol breathing treatment, s no inspiratory wheeze in the mid to upper lung fields but continued to have expiratory wheeze throughout all lung fields and bilateral lower lung fields inspiratory and expiratory wheeze.  She is able to speak in complete sentences, did not appear to have any respiratory distress, no retractions or accessory muscle use.  Pt did note an improvement in her ease of breathing and chest tightness.  RR 21 HR 111 SpO2 96%   Feel she may have some hypoventilation (OHS) secondary to body habitus/breast size, RR was elevated but she did not have any retractions, accessory muscle use or respiratory distress.    Assessment & Plan:      ICD-10-CM   1. Asthmatic bronchitis with acute exacerbation, unspecified asthma severity, unspecified whether persistent J45.901 ipratropium-albuterol (DUONEB) 0.5-2.5 (3) MG/3ML nebulizer solution 3 mL    methylPREDNISolone acetate (DEPO-MEDROL) injection 40 mg    albuterol (PROVENTIL) (2.5 MG/3ML) 0.083% nebulizer solution 2.5 mg    Albuterol Sulfate (PROAIR RESPICLICK) 108 (90 Base) MCG/ACT AEPB    benzonatate (TESSALON) 100 MG capsule    HYDROcodone-homatropine (HYCODAN) 5-1.5 MG/5ML syrup    predniSONE (DELTASONE) 20 MG tablet    DISCONTINUED: HYDROcodone-homatropine (HYCODAN) 5-1.5 MG/5ML syrup    DISCONTINUED: predniSONE (DELTASONE) 20 MG tablet    DISCONTINUED: benzonatate (TESSALON) 100 MG capsule    DISCONTINUED: Albuterol Sulfate (PROAIR RESPICLICK) 108 (90 Base) MCG/ACT AEPB  2. Dyspnea, unspecified type R06.00 DG Chest 2 View    Patient did refuse Depo-Medrol injection here in clinic, strongly advised patient that with her  degree of wheeze and shortness of breath, steroids would be indicated and would be very helpful in treating was likely bronchitis versus asthmatic bronchitis.    She did endorse some dyspnea with exertion and some orthopnea,  again do feel this may be related to obesity hypoventilation syndrome, and I have very little concern for other etiologies of dyspnea such as heart failure, PE.  Did review with patient at length that if she has any worsening of her breathing or any new chest pain with shortness of breath that she should go to the ER for emergent evaluation of other more serious conditions which could cause the symptoms.  She appeared euvolemic, hx not consistent with CAD, no risk factor in hx for PE.  Cannot be ruled out with PERC criteria because of mild tachycardia however I do believe this is secondary to her bronchitis because of how tight and wheezy she was on exam.     Danelle BerryLeisa Zion Ta, PA-C 02/11/18 1:27 PM

## 2018-02-11 NOTE — Patient Instructions (Addendum)
[facility] and present for chest x-ray, they will register you and get you to radiology department.  If your symptoms do not improve with the medications, especially if you become short of breath, have chest tightness, or distressed and it is not improved with multiple uses of the inhaler (ie: 2 puffs repeated 3 x in an hour) he need to go to the ER for assessment, or call 911.  If you did not tolerate steroids, I would strongly urged that you try them because your heart rate is elevated, you are short of breath with walking, and on exam your lungs are very tight and wheezy.  The only treatment for this is steroids and breathing treatments.    You can take a lower dose of steroids however I would recommend trying for at least 2 to 3 days, 40 mg of prednisone first thing in the morning, and if you feel better by the third day decrease the morning dose to 20 mg and finished taking for 3-5 days.  I would recommend that if you do not start to get better in the next 2 days, especially if you develop any chest pain or worsening breathing you do need to go to the ER or return here for further work-up which would include blood work, EKG or possibly CAT scans.  With any distress this need to be done in the ER.  1. Asthmatic bronchitis with acute exacerbation, unspecified asthma severity, unspecified whether persistent  PRESCRIPTIONS: - HYDROcodone-homatropine (HYCODAN) 5-1.5 MG/5ML syrup; Take 5 mLs by mouth every 8 (eight) hours as needed for cough. - predniSONE (DELTASONE) 20 MG tablet; Take 2 tablets (40 mg total) by mouth daily with breakfast for 5 days. - benzonatate (TESSALON) 100 MG capsule; Take 1 capsule (100 mg total) by mouth 3 (three) times daily as needed for cough. - Albuterol Sulfate (PROAIR RESPICLICK) 108 (90 Base) MCG/ACT AEPB; Inhale 2 puffs into the lungs 4 (four) times daily as needed.  - methylPREDNISolone acetate (DEPO-MEDROL) injection 40 mg - REFUSED IN  CLINIC - albuterol (PROVENTIL) (2.5 MG/3ML) 0.083% nebulizer solution 2.5 mg - ipratropium-albuterol (DUONEB) 0.5-2.5 (3) MG/3ML nebulizer solution 3 mL    2. Dyspnea, unspecified type - DG Chest 2 View  IF YOUR BREATHING WORSENS YOU NEED TO GET RECHECKED

## 2018-02-11 NOTE — Progress Notes (Signed)
Walking patient around office to check her saturation rate patient started to feel tired and out of breath after walking around the office with 3 laps. Starting out patient saturation rate was 96 pulse 104, lap 1 saturation 95 pulse 116, lap 2 saturation 94 pulse128, lap 3 saturation 94 pulse 125.

## 2018-02-12 ENCOUNTER — Ambulatory Visit: Payer: BLUE CROSS/BLUE SHIELD | Admitting: Family Medicine

## 2018-02-13 ENCOUNTER — Telehealth: Payer: Self-pay | Admitting: Family Medicine

## 2018-02-13 NOTE — Telephone Encounter (Signed)
She was supposed to get that from the front desk as she left, I apologize.   We will have one ready for her, if she is able to pick it up.  Or we can fax it to her if she has a place to receive that.  Please advise.

## 2018-02-13 NOTE — Telephone Encounter (Signed)
Patient called in today requesting a work note. Patient states that she seen you on Tuesday and has not been to work since. Patient would like a doctors note for 06/11-06/13 with a return date for tomorrow 06/14. Please advise?

## 2018-02-14 ENCOUNTER — Emergency Department (HOSPITAL_BASED_OUTPATIENT_CLINIC_OR_DEPARTMENT_OTHER)
Admission: EM | Admit: 2018-02-14 | Discharge: 2018-02-14 | Disposition: A | Discharge status: Home / Self Care | Payer: BLUE CROSS/BLUE SHIELD | Attending: Emergency Medicine | Admitting: Emergency Medicine

## 2018-02-14 ENCOUNTER — Emergency Department (HOSPITAL_BASED_OUTPATIENT_CLINIC_OR_DEPARTMENT_OTHER): Payer: BLUE CROSS/BLUE SHIELD

## 2018-02-14 ENCOUNTER — Encounter: Payer: Self-pay | Admitting: Family Medicine

## 2018-02-14 ENCOUNTER — Encounter (HOSPITAL_BASED_OUTPATIENT_CLINIC_OR_DEPARTMENT_OTHER): Payer: Self-pay | Admitting: *Deleted

## 2018-02-14 ENCOUNTER — Other Ambulatory Visit: Payer: Self-pay

## 2018-02-14 DIAGNOSIS — R05 Cough: Secondary | ICD-10-CM | POA: Diagnosis present

## 2018-02-14 DIAGNOSIS — Z79899 Other long term (current) drug therapy: Secondary | ICD-10-CM | POA: Insufficient documentation

## 2018-02-14 DIAGNOSIS — J45909 Unspecified asthma, uncomplicated: Secondary | ICD-10-CM | POA: Diagnosis not present

## 2018-02-14 DIAGNOSIS — J40 Bronchitis, not specified as acute or chronic: Secondary | ICD-10-CM | POA: Diagnosis not present

## 2018-02-14 DIAGNOSIS — F17210 Nicotine dependence, cigarettes, uncomplicated: Secondary | ICD-10-CM | POA: Insufficient documentation

## 2018-02-14 DIAGNOSIS — Z7984 Long term (current) use of oral hypoglycemic drugs: Secondary | ICD-10-CM | POA: Diagnosis not present

## 2018-02-14 LAB — PREGNANCY, URINE: PREG TEST UR: NEGATIVE

## 2018-02-14 MED ORDER — LEVALBUTEROL TARTRATE 45 MCG/ACT IN AERO: 1.0000 | INHALATION_SPRAY | RESPIRATORY_TRACT | 0 refills | Status: DC | PRN

## 2018-02-14 MED ORDER — ALBUTEROL SULFATE (2.5 MG/3ML) 0.083% IN NEBU
2.5000 mg | INHALATION_SOLUTION | Freq: Once | RESPIRATORY_TRACT | Status: AC
  Administered 2018-02-14: 2.5 mg via RESPIRATORY_TRACT
  Filled 2018-02-14: qty 3

## 2018-02-14 MED ORDER — DOXYCYCLINE HYCLATE 100 MG PO CAPS: 100.0000 mg | ORAL_CAPSULE | Freq: Two times a day (BID) | ORAL | 0 refills | Status: DC

## 2018-02-14 MED ORDER — IPRATROPIUM-ALBUTEROL 0.5-2.5 (3) MG/3ML IN SOLN
3.0000 mL | Freq: Once | RESPIRATORY_TRACT | Status: AC
  Administered 2018-02-14: 3 mL via RESPIRATORY_TRACT
  Filled 2018-02-14: qty 3

## 2018-02-14 MED ORDER — DEXAMETHASONE SODIUM PHOSPHATE 10 MG/ML IJ SOLN
10.0000 mg | Freq: Once | INTRAMUSCULAR | Status: AC
  Administered 2018-02-14: 10 mg via INTRAMUSCULAR
  Filled 2018-02-14: qty 1

## 2018-02-14 NOTE — ED Provider Notes (Signed)
[facility] EMERGENCY DEPARTMENT Provider Note   CSN: [id number] Arrival date & time: 02/14/18  0244     History   Chief Complaint Chief Complaint  Patient presents with  . URI    HPI Mariah [name] is a 42 y.o. female.  The history is provided by the patient.  URI   This is a new problem. The current episode started more than 2 days ago. The problem has not changed since onset.There has been no fever. Associated symptoms include cough and wheezing. Pertinent negatives include no chest pain, no abdominal pain, no diarrhea, no nausea, no vomiting, no dysuria, no congestion, no ear pain, no headaches, no plugged ear sensation, no rhinorrhea, no sinus pain, no sneezing, no sore throat, no swollen glands, no joint pain, no joint swelling and no neck pain. Treatments tried: narcotic cough syrup and tesselon, did not fill inhaler.  Continues smoking. The treatment provided no relief.    Past Medical History:  Diagnosis Date  . Abnormal Pap smear 09/26/10   ASCUS  . Abnormal uterine bleeding   . Asthma    Albuterol Inhaler last used 7/12  . Hypercholesteremia   . Menstrual cycle disorder   . Morbid obesity (HCC)   . Obesity   . 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 Hyperlipidemia 06/03/2017  . Diabetes mellitus without complication (HCC) 06/03/2017  . Obesity 06/03/2017  . Tobacco use disorder 06/03/2017  . Simple endometrial hyperplasia without atypia 03/15/2011    Past Surgical History:  Procedure Laterality Date  . APPENDECTOMY    . CYSTECTOMY  1989  . ENDOMETRIAL BIOPSY  02/13/11   AUB  . HYSTEROSCOPY W/D&C  04/16/2011   Procedure: DILATATION AND CURETTAGE (D&C) /HYSTEROSCOPY;  Surgeon: Tereso Newcomer, MD;  Location: [facility];  Service: Gynecology;  Laterality: N/A;  Diagnostic  . polyp removal  2010     OB History    Gravida  0   Para  0   Term  0   Preterm  0   AB  0   Living  0     SAB  0   TAB  0   Ectopic  0   Multiple  0   Live Births  0            Home Medications    Prior to Admission medications   Medication Sig Start Date End Date Taking? Authorizing Provider  Albuterol Sulfate (PROAIR RESPICLICK) 108 (90 Base) MCG/ACT AEPB Inhale 2 puffs into the lungs 4 (four) times daily as needed. 02/11/18   Danelle Berry, PA-C  benzonatate (TESSALON) 100 MG capsule Take 1 capsule (100 mg total) by mouth 3 (three) times daily as needed for cough. 02/11/18   Danelle Berry, PA-C  HYDROcodone-homatropine (HYCODAN) 5-1.5 MG/5ML syrup Take 5 mLs by mouth every 8 (eight) hours as needed for cough. 02/11/18   Danelle Berry, PA-C  ibuprofen (ADVIL,MOTRIN) 600 MG tablet Take 1 tablet (600 mg total) by mouth every 6 (six) hours as needed. 08/08/15   Ivery Quale, PA-C  insulin degludec (TRESIBA FLEXTOUCH) 100 UNIT/ML SOPN FlexTouch Pen Inject 0.05 mLs (5 Units total) into the skin daily at 10 pm. 11/04/17   Salley Scarlet, MD  Insulin Pen Needle (BD PEN NEEDLE MICRO U/F) 32G X 6 MM MISC Use as directed to inject insulin QD. 11/05/17   Salley Scarlet, MD  metFORMIN (GLUCOPHAGE) 1000 MG tablet  Take 1 tablet (1,000 mg total) by mouth 2 (two) times daily with a meal. 08/08/17   [place], Velna Hatchet, MD  predniSONE (DELTASONE) 20 MG tablet Take 2 tablets (40 mg total) by mouth daily with breakfast for 5 days. 02/11/18 02/16/18  Danelle Berry, PA-C    Family History Family History  Problem Relation Age of Onset  . Diabetes Mother   . Heart disease Mother   . Hyperlipidemia Mother   . Diabetes Maternal Grandmother   . Diabetes Maternal Grandfather   . Cancer Paternal Grandmother   . Breast cancer Paternal Grandmother     Social History Social History   Tobacco Use  . Smoking status: Current Every Day Smoker    Packs/day: 1.00    Years: 14.00    Pack years: 14.00    Types: Cigarettes  . Smokeless tobacco: Never Used  Substance Use Topics  . Alcohol use: Yes      Alcohol/week: 0.0 oz    Comment: occassonially  . Drug use: No     Allergies   Patient has no known allergies.   Review of Systems Review of Systems  Constitutional: Negative for fever.  HENT: Negative for congestion, ear pain, rhinorrhea, sinus pain, sneezing and sore throat.   Eyes: Negative for photophobia.  Respiratory: Positive for cough and wheezing. Negative for choking, shortness of breath and stridor.   Cardiovascular: Negative for chest pain, palpitations and leg swelling.  Gastrointestinal: Negative for abdominal pain, diarrhea, nausea and vomiting.  Genitourinary: Negative for dysuria.  Musculoskeletal: Negative for joint pain and neck pain.  Neurological: Negative for headaches.  All other systems reviewed and are negative.    Physical Exam Updated Vital Signs BP 117/73 (BP Location: Right Arm)   Pulse (!) 101   Temp 98.2 F (36.8 C) (Oral)   Resp 18   Ht 5' 7.5" (1.715 m)   Wt 123.4 kg (272 lb)   SpO2 97%   BMI 41.97 kg/m   Physical Exam  Constitutional: She is oriented to person, place, and time. She appears well-developed and well-nourished. No distress.  HENT:  Head: Normocephalic and atraumatic.  Mouth/Throat: No oropharyngeal exudate.  Eyes: Pupils are equal, round, and reactive to light. Conjunctivae are normal.  Neck: Normal range of motion. Neck supple. No JVD present.  Cardiovascular: Normal rate, regular rhythm, normal heart sounds and intact distal pulses.  Pulmonary/Chest: No respiratory distress. She has decreased breath sounds. She has wheezes. She has no rales.  Abdominal: Soft. Bowel sounds are normal.  Musculoskeletal: Normal range of motion. She exhibits no edema or tenderness.  Neurological: She is alert and oriented to person, place, and time. She displays normal reflexes.  Skin: Skin is warm and dry. Capillary refill takes less than 2 seconds.  Psychiatric: She has a normal mood and affect.  Nursing note and vitals  reviewed.    ED Treatments / Results  Labs (all labs ordered are listed, but only abnormal results are displayed) Results for orders placed or performed during the hospital encounter of 02/14/18  Pregnancy, urine  Result Value Ref Range   Preg Test, Ur NEGATIVE NEGATIVE    Radiology No results found.  Procedures Procedures (including critical care time)  Medications Ordered in ED Medications  ipratropium-albuterol (DUONEB) 0.5-2.5 (3) MG/3ML nebulizer solution 3 mL (3 mLs Nebulization Given 02/14/18 0306)  albuterol (PROVENTIL) (2.5 MG/3ML) 0.083% nebulizer solution 2.5 mg (2.5 mg Nebulization Given 02/14/18 0306)  dexamethasone (DECADRON) injection 10 mg (10 mg Intramuscular Given 02/14/18 0345)  albuterol (PROVENTIL) (2.5 MG/3ML) 0.083% nebulizer solution 2.5 mg (2.5 mg Nebulization Given 02/14/18 0409)     Final Clinical Impressions(s) / ED Diagnoses   Good RX coupon printed for inhaler.    Return for weakness, numbness, changes in vision or speech, fevers >100.4 unrelieved by medication, shortness of breath, intractable vomiting, or diarrhea, abdominal pain, Inability to tolerate liquids or food, cough, altered mental status or any concerns. No signs of systemic illness or infection. The patient is nontoxic-appearing on exam and vital signs are within normal limits.   I have reviewed the triage vital signs and the nursing notes. Pertinent labs &imaging results that were available during my care of the patient were reviewed by me and considered in my medical decision making (see chart for details).  After history, exam, and medical workup I feel the patient has been appropriately medically screened and is safe for discharge home. Pertinent diagnoses were discussed with the patient. Patient was given return precautions.      Porshia Blizzard, MD 02/14/18 [phone]

## 2018-02-14 NOTE — Telephone Encounter (Signed)
Spoke with patient and informed her that she may pick up work note for dates 02/11/18-02/13/18.

## 2018-02-14 NOTE — ED Triage Notes (Signed)
Cough x several days. Has seen pcp and been treated with multiple cough medications without relief

## 2018-02-26 ENCOUNTER — Ambulatory Visit (INDEPENDENT_AMBULATORY_CARE_PROVIDER_SITE_OTHER): Payer: BLUE CROSS/BLUE SHIELD | Admitting: Family Medicine

## 2018-02-26 ENCOUNTER — Encounter: Payer: Self-pay | Admitting: Family Medicine

## 2018-02-26 ENCOUNTER — Other Ambulatory Visit: Payer: Self-pay

## 2018-02-26 VITALS — BP 132/82 | HR 86 | Temp 97.9°F | Resp 12 | Ht 67.5 in | Wt 268.0 lb

## 2018-02-26 DIAGNOSIS — F17200 Nicotine dependence, unspecified, uncomplicated: Secondary | ICD-10-CM | POA: Diagnosis not present

## 2018-02-26 DIAGNOSIS — Z6841 Body Mass Index (BMI) 40.0 and over, adult: Secondary | ICD-10-CM

## 2018-02-26 DIAGNOSIS — E119 Type 2 diabetes mellitus without complications: Secondary | ICD-10-CM

## 2018-02-26 DIAGNOSIS — E7800 Pure hypercholesterolemia, unspecified: Secondary | ICD-10-CM

## 2018-02-26 MED ORDER — LISINOPRIL 2.5 MG PO TABS: 2.5000 mg | ORAL_TABLET | Freq: Every day | ORAL | 6 refills | Status: DC

## 2018-02-26 NOTE — Progress Notes (Signed)
   Subjective:    Patient ID: Mariah [name], female    DOB: 02/14/1976, 42 y.o.   MRN: [id number]  Patient presents for Follow-up (is fasting) Pt here to f/u chronic medical problems Was at work and could not breathe. Dx with bronchitis. Had to be seen in ER 6/14, Xray showed bronchitis, given nebs , sent home with inhaler. Breathing is good, has xopenex Quit smoking for 12 days, then restarted  She has had left side pain since she had coughing fits, pain radiaets down her ribs to the front, if she laughs, cougs, moves a certain way will have pain    DM- last A1C   6.8%  , Tresiba 5 units was the last dose, she has not been taking   METFORMIN , Was checking blood sugars 120's/ recently 140's   Weight down 4lbs in past 2 weeks   Due for Eye appointment    Review Of Systems:  GEN- denies fatigue, fever, weight loss,weakness, recent illness HEENT- denies eye drainage, change in vision, nasal discharge, CVS- denies chest pain, palpitations RESP- denies SOB, cough, wheeze ABD- denies N/V, change in stools, abd pain GU- denies dysuria, hematuria, dribbling, incontinence MSK- denies joint pain, muscle aches, injury Neuro- denies headache, dizziness, syncope, seizure activity       Objective:    BP 132/82   Pulse 86   Temp 97.9 F (36.6 C) (Oral)   Resp 12   Ht 5' 7.5" (1.715 m)   Wt 268 lb (121.6 kg)   SpO2 98%   BMI 41.36 kg/m  GEN- NAD, alert and oriented x3 HEENT- PERRL, EOMI, non injected sclera, pink conjunctiva, MMM, oropharynx clear Neck- Supple, no thyromegaly CVS- RRR, no murmur RESP-CTAB ABD-NABS,soft,NT,ND EXT- No edema Pulses- Radial, DP- 2+        Assessment & Plan:      Problem List Items Addressed This Visit      Unprioritized   Diabetes mellitus without complication (HCC)    Discussed compliance with regimen Will recheck her A1C, if less than 9%, will plan to add on to metformin with Jardiance Also discussed adding lisinopril for renal  protection Recheck her lipids  Counseled on tobacco cessation      Relevant Medications   lisinopril (ZESTRIL) 2.5 MG tablet   Other Relevant Orders   Comprehensive metabolic panel (Completed)   Hemoglobin A1c (Completed)   Hyperlipidemia   Relevant Medications   lisinopril (ZESTRIL) 2.5 MG tablet   Other Relevant Orders   Lipid panel (Completed)   Obesity   Tobacco use disorder - Primary    She has patches at home Will also get nicorette gum         Note: This dictation was prepared with Dragon dictation along with smaller phrase technology. Any transcriptional errors that result from this process are unintentional.

## 2018-02-26 NOTE — Patient Instructions (Signed)
F/U 4 months - Leisa  Lisinopril sent for kidney protection

## 2018-02-27 ENCOUNTER — Encounter: Payer: Self-pay | Admitting: Family Medicine

## 2018-02-27 ENCOUNTER — Other Ambulatory Visit: Payer: Self-pay | Admitting: *Deleted

## 2018-02-27 LAB — LIPID PANEL
Cholesterol: 256 mg/dL — ABNORMAL HIGH (ref <200)
HDL: 45 mg/dL — ABNORMAL LOW (ref >50)
LDL CHOLESTEROL (CALC): 195 mg/dL — AB
Non-HDL Cholesterol (Calc): 211 mg/dL (calc) — ABNORMAL HIGH (ref <130)
Total CHOL/HDL Ratio: 5.7 (calc) — ABNORMAL HIGH (ref <5.0)
Triglycerides: 63 mg/dL (ref <150)

## 2018-02-27 LAB — COMPREHENSIVE METABOLIC PANEL
AG RATIO: 1.3 (calc) (ref 1.0–2.5)
ALBUMIN MSPROF: 4.1 g/dL (ref 3.6–5.1)
ALT: 11 U/L (ref 6–29)
AST: 10 U/L (ref 10–30)
Alkaline phosphatase (APISO): 61 U/L (ref 33–115)
BUN: 11 mg/dL (ref 7–25)
CALCIUM: 9.6 mg/dL (ref 8.6–10.2)
CO2: 24 mmol/L (ref 20–32)
Chloride: 102 mmol/L (ref 98–110)
Creat: 0.78 mg/dL (ref 0.50–1.10)
GLUCOSE: 115 mg/dL — AB (ref 65–99)
Globulin: 3.2 g/dL (calc) (ref 1.9–3.7)
POTASSIUM: 4.6 mmol/L (ref 3.5–5.3)
SODIUM: 135 mmol/L (ref 135–146)
TOTAL PROTEIN: 7.3 g/dL (ref 6.1–8.1)
Total Bilirubin: 0.5 mg/dL (ref 0.2–1.2)

## 2018-02-27 LAB — HEMOGLOBIN A1C
EAG (MMOL/L): 8.7 (calc)
Hgb A1c MFr Bld: 7.1 % of total Hgb — ABNORMAL HIGH (ref <5.7)
MEAN PLASMA GLUCOSE: 157 (calc)

## 2018-02-27 MED ORDER — ATORVASTATIN CALCIUM 20 MG PO TABS: 20.0000 mg | ORAL_TABLET | Freq: Every day | ORAL | 3 refills | Status: DC

## 2018-02-27 NOTE — Assessment & Plan Note (Signed)
Discussed compliance with regimen Will recheck her A1C, if less than 9%, will plan to add on to metformin with Jardiance Also discussed adding lisinopril for renal protection Recheck her lipids  Counseled on tobacco cessation

## 2018-02-27 NOTE — Assessment & Plan Note (Signed)
She has patches at home Will also get nicorette gum

## 2018-03-26 ENCOUNTER — Other Ambulatory Visit: Payer: Self-pay | Admitting: Obstetrics & Gynecology

## 2018-03-27 ENCOUNTER — Other Ambulatory Visit: Payer: Self-pay | Admitting: Obstetrics & Gynecology

## 2018-04-24 ENCOUNTER — Other Ambulatory Visit: Payer: Self-pay | Admitting: Family Medicine

## 2018-05-07 ENCOUNTER — Ambulatory Visit: Payer: BLUE CROSS/BLUE SHIELD | Admitting: Student

## 2018-05-30 ENCOUNTER — Other Ambulatory Visit: Payer: Self-pay | Admitting: Family Medicine

## 2018-06-05 ENCOUNTER — Ambulatory Visit (INDEPENDENT_AMBULATORY_CARE_PROVIDER_SITE_OTHER): Payer: BLUE CROSS/BLUE SHIELD | Admitting: Obstetrics & Gynecology

## 2018-06-05 ENCOUNTER — Encounter: Payer: Self-pay | Admitting: Obstetrics & Gynecology

## 2018-06-05 VITALS — BP 128/85 | HR 89 | Ht 67.0 in | Wt 272.0 lb

## 2018-06-05 DIAGNOSIS — Z1151 Encounter for screening for human papillomavirus (HPV): Secondary | ICD-10-CM | POA: Diagnosis not present

## 2018-06-05 DIAGNOSIS — Z3202 Encounter for pregnancy test, result negative: Secondary | ICD-10-CM | POA: Diagnosis not present

## 2018-06-05 DIAGNOSIS — Z113 Encounter for screening for infections with a predominantly sexual mode of transmission: Secondary | ICD-10-CM | POA: Diagnosis not present

## 2018-06-05 DIAGNOSIS — N8501 Benign endometrial hyperplasia: Secondary | ICD-10-CM

## 2018-06-05 DIAGNOSIS — Z124 Encounter for screening for malignant neoplasm of cervix: Secondary | ICD-10-CM | POA: Diagnosis not present

## 2018-06-05 DIAGNOSIS — Z1231 Encounter for screening mammogram for malignant neoplasm of breast: Secondary | ICD-10-CM

## 2018-06-05 DIAGNOSIS — N898 Other specified noninflammatory disorders of vagina: Secondary | ICD-10-CM | POA: Diagnosis not present

## 2018-06-05 DIAGNOSIS — Z01419 Encounter for gynecological examination (general) (routine) without abnormal findings: Secondary | ICD-10-CM | POA: Diagnosis not present

## 2018-06-05 LAB — POCT URINE PREGNANCY: Preg Test, Ur: NEGATIVE

## 2018-06-05 NOTE — Progress Notes (Signed)
GYNECOLOGY ANNUAL PREVENTATIVE CARE ENCOUNTER NOTE  Subjective:   Mariah [name] is a 42 y.o. G0 female here for a routine annual gynecologic exam.  Current complaints: no GYN concerns.   Denies abnormal vaginal bleeding, discharge, pelvic pain, problems with intercourse or other gynecologic concerns.    Obstetric and Gynecologic History No LMP recorded (approximate). (Menstrual status: Irregular Periods).  Never been pregnant. Contraception: condoms Last Pap: 03/25/2017. Results were: normal with negative HPV Last mammogram: 07/10/2017. Results were: normal  Past Medical History:  Diagnosis Date  . Abnormal Pap smear 09/26/10   ASCUS  . Abnormal uterine bleeding   . Asthma    Albuterol Inhaler last used 7/12  . Diabetes mellitus without complication (HCC)   . Hypercholesteremia   . Menstrual cycle disorder   . Morbid obesity (HCC)   . Obesity   . Simple endometrial hyperplasia without atypia     Past Surgical History:  Procedure Laterality Date  . APPENDECTOMY    . CYSTECTOMY  1989  . ENDOMETRIAL BIOPSY  02/13/11   AUB  . HYSTEROSCOPY W/D&C  04/16/2011   Procedure: DILATATION AND CURETTAGE (D&C) /HYSTEROSCOPY;  Surgeon: Tereso Newcomer, MD;  Location: [facility];  Service: Gynecology;  Laterality: N/A;  Diagnostic  . polyp removal  2010    Current Outpatient Medications on File Prior to Visit  Medication Sig Dispense Refill  . lisinopril (ZESTRIL) 2.5 MG tablet Take 1 tablet (2.5 mg total) by mouth daily. 30 tablet 6  . megestrol (MEGACE) 40 MG tablet TAKE 1 TABLET BY MOUTH ONCE DAILY *CAN INCREASE TO 2 TABLETS EVERY DAY IN THE EVENT OF HEAVY BLEEDING* 30 tablet 0  . metFORMIN (GLUCOPHAGE) 1000 MG tablet Take 1 tablet (1,000 mg total) by mouth 2 (two) times daily with a meal. 180 tablet 3   No current facility-administered medications on file prior to visit.     No Known Allergies  Social History:  reports that she has been smoking cigarettes. She has a 14.00  pack-year smoking history. She has never used smokeless tobacco. She reports that she drinks alcohol. She reports that she does not use drugs.  Family History  Problem Relation Age of Onset  . Diabetes Mother   . Heart disease Mother   . Hyperlipidemia Mother   . Diabetes Maternal Grandmother   . Diabetes Maternal Grandfather   . Cancer Paternal Grandmother   . Breast cancer Paternal Grandmother     The following portions of the patient's history were reviewed and updated as appropriate: allergies, current medications, past family history, past medical history, past social history, past surgical history and problem list.  Review of Systems Pertinent items noted in HPI and remainder of comprehensive ROS otherwise negative.   Objective:  BP 128/85   Pulse 89   Ht 5\' 7"  (1.702 m)   Wt 272 lb (123.4 kg)   LMP  (Approximate) Comment: pt states she had one in august   BMI 42.60 kg/m  CONSTITUTIONAL: Well-developed, well-nourished female in no acute distress.  HENT:  Normocephalic, atraumatic, External right and left ear normal. Oropharynx is clear and moist EYES: Conjunctivae and EOM are normal. Pupils are equal, round, and reactive to light. No scleral icterus.  NECK: Normal range of motion, supple, no masses.  Normal thyroid.  SKIN: Skin is warm and dry. No rash noted. Not diaphoretic. No erythema. No pallor. NEUROLOGIC: Alert and oriented to person, place, and time. Normal reflexes, muscle tone coordination. No cranial nerve deficit noted.  PSYCHIATRIC: Normal mood and affect. Normal behavior. Normal judgment and thought content. CARDIOVASCULAR: Normal heart rate noted, regular rhythm RESPIRATORY: Clear to auscultation bilaterally. Effort and breath sounds normal, no problems with respiration noted. BREASTS: Symmetric in size. No masses, skin changes, nipple drainage, or lymphadenopathy. ABDOMEN: Soft, obese, normal bowel sounds, no distention appreciated.  No tenderness, rebound or  guarding.  PELVIC: Normal appearing external genitalia; normal appearing vaginal mucosa and cervix.  Scant white discharge, testing sample and  Pap smear obtained.  Unable to palpate uterus or adnexa secondary to habitus.  MUSCULOSKELETAL: Normal range of motion. No tenderness.  No cyanosis, clubbing, or edema.  2+ distal pulses.  ENDOMETRIAL BIOPSY   The indications for endometrial biopsy were reviewed. Risks of the biopsy including cramping, bleeding, infection, uterine injury, inadequate specimen and need for additional procedures were discussed. The patient states she understands and agrees to undergo procedure today. Consent was signed. Time out was performed. Urine HCG was negative. During the pelvic exam, the cervix was prepped with Betadine. The 3 mm pipelle was introduced into the endometrial cavity without difficulty to a depth of 8 cm, and a moderate amount of tissue was obtained and sent to pathology. The instruments were removed from the patient's vagina. Minimal bleeding from the cervix was noted. The patient tolerated the procedure well.Routine post-procedure instructionswere given to the patient.     Assessment and Plan:  1. Encounter for screening mammogram for breast cancer Mammogram to be scheduled - MM 3D SCREEN BREAST BILATERAL; Future  2. Simple endometrial hyperplasia without atypia Emphasized that patient needs endometrial biopsies for surveillance to ensure no progression.  Also recommended to continue progestin therapy, suggested switch to levonorgestrel IUD and also recommended it for contraception (currently on just condoms). Patient declines IUD, wants to continue Megace for now.  Follow up biopsy results. If this is benign, may consider lengthening interval of biopsies  To two years given that she is on continuous progestin therapy, and had no history of atypia.  - Surgical pathology  3. Encounter for gynecological examination with Papanicolaou smear of  cervix - Cytology - PAP - Hepatitis B surface antigen - Hepatitis C antibody - RPR - HIV Antibody (routine testing w rflx) - Cervicovaginal ancillary only Will follow up results of pap smear and annual labs and manage accordingly. Routine preventative health maintenance measures emphasized. Please refer to After Visit Summary for other counseling recommendations.    Jaynie Collins, MD, FACOG Obstetrician & Gynecologist, [facility] [employer], [facility]

## 2018-06-05 NOTE — Patient Instructions (Signed)
Preventive Care 40-64 Years, Female Preventive care refers to lifestyle choices and visits with your health care provider that can promote health and wellness. What does preventive care include?  A yearly physical exam. This is also called an annual well check.  Dental exams once or twice a year.  Routine eye exams. Ask your health care provider how often you should have your eyes checked.  Personal lifestyle choices, including: ? Daily care of your teeth and gums. ? Regular physical activity. ? Eating a healthy diet. ? Avoiding tobacco and drug use. ? Limiting alcohol use. ? Practicing safe sex. ? Taking low-dose aspirin daily starting at age 41. ? Taking vitamin and mineral supplements as recommended by your health care provider. What happens during an annual well check? The services and screenings done by your health care provider during your annual well check will depend on your age, overall health, lifestyle risk factors, and family history of disease. Counseling Your health care provider may ask you questions about your:  Alcohol use.  Tobacco use.  Drug use.  Emotional well-being.  Home and relationship well-being.  Sexual activity.  Eating habits.  Work and work Statistician.  Method of birth control.  Menstrual cycle.  Pregnancy history.  Screening You may have the following tests or measurements:  Height, weight, and BMI.  Blood pressure.  Lipid and cholesterol levels. These may be checked every 5 years, or more frequently if you are over 41 years old.  Skin check.  Lung cancer screening. You may have this screening every year starting at age 41 if you have a 30-pack-year history of smoking and currently smoke or have quit within the past 15 years.  Fecal occult blood test (FOBT) of the stool. You may have this test every year starting at age 41.  Flexible sigmoidoscopy or colonoscopy. You may have a sigmoidoscopy every 5 years or a colonoscopy  every 10 years starting at age 41.  Hepatitis C blood test.  Hepatitis B blood test.  Sexually transmitted disease (STD) testing.  Diabetes screening. This is done by checking your blood sugar (glucose) after you have not eaten for a while (fasting). You may have this done every 1-3 years.  Mammogram. This may be done every 1-2 years. Talk to your health care provider about when you should start having regular mammograms. This may depend on whether you have a family history of breast cancer.  BRCA-related cancer screening. This may be done if you have a family history of breast, ovarian, tubal, or peritoneal cancers.  Pelvic exam and Pap test. This may be done every 3 years starting at age 41. Starting at age 36, this may be done every 5 years if you have a Pap test in combination with an HPV test.  Bone density scan. This is done to screen for osteoporosis. You may have this scan if you are at high risk for osteoporosis.  Discuss your test results, treatment options, and if necessary, the need for more tests with your health care provider. Vaccines Your health care provider may recommend certain vaccines, such as:  Influenza vaccine. This is recommended every year.  Tetanus, diphtheria, and acellular pertussis (Tdap, Td) vaccine. You may need a Td booster every 10 years.  Varicella vaccine. You may need this if you have not been vaccinated.  Zoster vaccine. You may need this after age 41.  Measles, mumps, and rubella (MMR) vaccine. You may need at least one dose of MMR if you were born in  1957 or later. You may also need a second dose.  Pneumococcal 13-valent conjugate (PCV13) vaccine. You may need this if you have certain conditions and were not previously vaccinated.  Pneumococcal polysaccharide (PPSV23) vaccine. You may need one or two doses if you smoke cigarettes or if you have certain conditions.  Meningococcal vaccine. You may need this if you have certain  conditions.  Hepatitis A vaccine. You may need this if you have certain conditions or if you travel or work in places where you may be exposed to hepatitis A.  Hepatitis B vaccine. You may need this if you have certain conditions or if you travel or work in places where you may be exposed to hepatitis B.  Haemophilus influenzae type b (Hib) vaccine. You may need this if you have certain conditions.  Talk to your health care provider about which screenings and vaccines you need and how often you need them. This information is not intended to replace advice given to you by your health care provider. Make sure you discuss any questions you have with your health care provider. Document Released: 09/16/2015 Document Revised: 05/09/2016 Document Reviewed: 06/21/2015 Elsevier Interactive Patient Education  2018 Elsevier Inc.  

## 2018-06-05 NOTE — Progress Notes (Signed)
4

## 2018-06-06 LAB — HEPATITIS C ANTIBODY

## 2018-06-06 LAB — HEPATITIS B SURFACE ANTIGEN: Hepatitis B Surface Ag: NEGATIVE

## 2018-06-06 LAB — HIV ANTIBODY (ROUTINE TESTING W REFLEX): HIV Screen 4th Generation wRfx: NONREACTIVE

## 2018-06-06 LAB — RPR: RPR: NONREACTIVE

## 2018-06-09 LAB — CERVICOVAGINAL ANCILLARY ONLY
Bacterial vaginitis: NEGATIVE
CANDIDA VAGINITIS: NEGATIVE
Chlamydia: NEGATIVE
Neisseria Gonorrhea: NEGATIVE
Trichomonas: NEGATIVE

## 2018-06-10 LAB — CYTOLOGY - PAP
Diagnosis: NEGATIVE
HPV: NOT DETECTED

## 2018-06-21 ENCOUNTER — Other Ambulatory Visit: Payer: Self-pay | Admitting: Family Medicine

## 2018-07-02 ENCOUNTER — Ambulatory Visit: Payer: BLUE CROSS/BLUE SHIELD | Admitting: Family Medicine

## 2018-07-08 ENCOUNTER — Encounter: Payer: Self-pay | Admitting: Family Medicine

## 2018-07-28 ENCOUNTER — Other Ambulatory Visit: Payer: Self-pay | Admitting: Family Medicine

## 2018-08-22 ENCOUNTER — Other Ambulatory Visit: Payer: Self-pay | Admitting: Family Medicine

## 2018-08-29 ENCOUNTER — Other Ambulatory Visit: Payer: Self-pay | Admitting: *Deleted

## 2018-08-29 ENCOUNTER — Telehealth: Payer: Self-pay | Admitting: *Deleted

## 2018-08-29 MED ORDER — MEGESTROL ACETATE 40 MG PO TABS
ORAL_TABLET | ORAL | 0 refills | Status: DC
Start: 2018-08-29 — End: 2018-10-01

## 2018-08-29 NOTE — Telephone Encounter (Signed)
Pt called requesting refill of her megace, and wanted clarification of her lab results.

## 2018-10-01 ENCOUNTER — Other Ambulatory Visit: Payer: Self-pay | Admitting: Obstetrics & Gynecology

## 2018-11-03 ENCOUNTER — Other Ambulatory Visit: Payer: Self-pay

## 2018-11-03 MED ORDER — FLUCONAZOLE 150 MG PO TABS: 150.0000 mg | ORAL_TABLET | Freq: Once | ORAL | 0 refills | Status: AC

## 2018-11-03 NOTE — Telephone Encounter (Signed)
Patient is requesting a diflucan be called into her pharmacy. 

## 2018-12-05 ENCOUNTER — Other Ambulatory Visit: Payer: Self-pay | Admitting: Family Medicine

## 2018-12-29 ENCOUNTER — Other Ambulatory Visit: Payer: Self-pay | Admitting: Family Medicine

## 2019-01-06 ENCOUNTER — Telehealth: Payer: Self-pay | Admitting: *Deleted

## 2019-01-06 NOTE — Telephone Encounter (Signed)
Received call from patient. [phone]~ telephone.   Reports that she is on leave from work at [employer] due to COVID precautions. States that she requires note to extend leave through peak.   Appointment scheduled for telephone visit.

## 2019-01-07 ENCOUNTER — Ambulatory Visit (INDEPENDENT_AMBULATORY_CARE_PROVIDER_SITE_OTHER): Payer: BLUE CROSS/BLUE SHIELD | Admitting: Family Medicine

## 2019-01-07 ENCOUNTER — Other Ambulatory Visit: Payer: Self-pay

## 2019-01-07 ENCOUNTER — Encounter: Payer: Self-pay | Admitting: Family Medicine

## 2019-01-07 DIAGNOSIS — E119 Type 2 diabetes mellitus without complications: Secondary | ICD-10-CM | POA: Diagnosis not present

## 2019-01-07 DIAGNOSIS — E7800 Pure hypercholesterolemia, unspecified: Secondary | ICD-10-CM

## 2019-01-07 NOTE — Progress Notes (Signed)
Virtual Visit via Telephone Note  I connected with Mariah [name] on 01/07/19 at 2:11pm by telephone and verified that I am speaking with the correct person using two identifiers.     Pt location: at home   Physician location:  In office, [facility], Milinda Antis MD     On call: patient and physician   I discussed the limitations, risks, security and privacy concerns of performing an evaluation and management service by telephone and the availability of in person appointments. I also discussed with the patient that there may be a patient responsible charge related to this service. The patient expressed understanding and agreed to proceed.   History of Present Illness: DM- last A1C in June 2019 7.1% , she is currently taking metformin 1000mg  BID prn   Given lisinopril 2.5mg  for renal protection but she felt tired with taking it.   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  She has extended her leave with her work but wanted to document this.  She also wanted something in writing that in her high risk state.  She is planning to return to work on June 15.  They have advised her at this time they do not need anything particular from our office as it was a voluntary leave of absence.     Observations/Objective: Telephone visit, NAD   Assessment and Plan: DM-uncontrolled we will bring her in for fasting labs.  She will continue the metformin for now.  We will have her try the lisinopril 2.5 mg at bedtime for renal protection see if she still has the fatigue if so we need to change her to an ARB.  Lipidemia we will plan to check her lipids in  the office on Monday.  She is high risk for COVID-19 complications that she does get the virus.  Her work conditions there is no way to separate her from customers per report.  She is taking a leave of absence voluntary.  I will support this with a letter stating her high risk condition.    Follow Up Instructions: F/U Monday for OV with labs     I discussed the assessment and treatment plan with the patient. The patient was provided an opportunity to ask questions and all were answered. The patient agreed with the plan and demonstrated an understanding of the instructions.   The patient was advised to call back or seek an in-person evaluation if the symptoms worsen or if the condition fails to improve as anticipated.  I provided 8 minutes of non-face-to-face time during this encounter. End Time: 2:19pm  Milinda Antis, MD

## 2019-01-12 ENCOUNTER — Encounter: Payer: Self-pay | Admitting: Family Medicine

## 2019-01-12 ENCOUNTER — Ambulatory Visit (INDEPENDENT_AMBULATORY_CARE_PROVIDER_SITE_OTHER): Payer: BLUE CROSS/BLUE SHIELD | Admitting: Family Medicine

## 2019-01-12 ENCOUNTER — Other Ambulatory Visit: Payer: Self-pay

## 2019-01-12 VITALS — BP 124/66 | HR 102 | Temp 98.2°F | Resp 16 | Ht 67.0 in | Wt 250.0 lb

## 2019-01-12 DIAGNOSIS — E7800 Pure hypercholesterolemia, unspecified: Secondary | ICD-10-CM

## 2019-01-12 DIAGNOSIS — R002 Palpitations: Secondary | ICD-10-CM

## 2019-01-12 DIAGNOSIS — E119 Type 2 diabetes mellitus without complications: Secondary | ICD-10-CM

## 2019-01-12 DIAGNOSIS — Z6839 Body mass index (BMI) 39.0-39.9, adult: Secondary | ICD-10-CM

## 2019-01-12 LAB — COMPREHENSIVE METABOLIC PANEL
AG Ratio: 1.7 (calc) (ref 1.0–2.5)
ALT: 9 U/L (ref 6–29)
AST: 7 U/L — ABNORMAL LOW (ref 10–30)
Albumin: 4.3 g/dL (ref 3.6–5.1)
Alkaline phosphatase (APISO): 96 U/L (ref 31–125)
BUN: 11 mg/dL (ref 7–25)
CO2: 21 mmol/L (ref 20–32)
Calcium: 9.6 mg/dL (ref 8.6–10.2)
Chloride: 103 mmol/L (ref 98–110)
Creat: 0.64 mg/dL (ref 0.50–1.10)
Globulin: 2.5 g/dL (calc) (ref 1.9–3.7)
Glucose, Bld: 320 mg/dL — ABNORMAL HIGH (ref 65–99)
Potassium: 4.3 mmol/L (ref 3.5–5.3)
Sodium: 135 mmol/L (ref 135–146)
Total Bilirubin: 0.5 mg/dL (ref 0.2–1.2)
Total Protein: 6.8 g/dL (ref 6.1–8.1)

## 2019-01-12 LAB — LIPID PANEL
Cholesterol: 145 mg/dL (ref <200)
HDL: 34 mg/dL — ABNORMAL LOW (ref >=50)
LDL Cholesterol (Calc): 96 mg/dL (calc)
Non-HDL Cholesterol (Calc): 111 mg/dL (calc) (ref <130)
Total CHOL/HDL Ratio: 4.3 (calc) (ref <5.0)
Triglycerides: 68 mg/dL (ref <150)

## 2019-01-12 LAB — CBC WITH DIFFERENTIAL/PLATELET
Absolute Monocytes: 357 cells/uL (ref 200–950)
Basophils Absolute: 51 cells/uL (ref 0–200)
Basophils Relative: 0.5 %
Eosinophils Absolute: 286 cells/uL (ref 15–500)
Eosinophils Relative: 2.8 %
HCT: 40 % (ref 35.0–45.0)
Hemoglobin: 13.5 g/dL (ref 11.7–15.5)
Lymphs Abs: 3162 cells/uL (ref 850–3900)
MCH: 27.2 pg (ref 27.0–33.0)
MCHC: 33.8 g/dL (ref 32.0–36.0)
MCV: 80.5 fL (ref 80.0–100.0)
MPV: 11 fL (ref 7.5–12.5)
Monocytes Relative: 3.5 %
Neutro Abs: 6344 cells/uL (ref 1500–7800)
Neutrophils Relative %: 62.2 %
Platelets: 327 10*3/uL (ref 140–400)
RBC: 4.97 10*6/uL (ref 3.80–5.10)
RDW: 13.3 % (ref 11.0–15.0)
Total Lymphocyte: 31 %
WBC: 10.2 10*3/uL (ref 3.8–10.8)

## 2019-01-12 LAB — TSH: TSH: 2.45 mIU/L

## 2019-01-12 LAB — HEMOGLOBIN A1C, FINGERSTICK: Hgb A1C (fingerstick): >14 % OF TOTAL HGB — ABNORMAL HIGH (ref <6.0)

## 2019-01-12 MED ORDER — DAPAGLIFLOZIN PROPANEDIOL 10 MG PO TABS: 10.0000 mg | ORAL_TABLET | Freq: Every day | ORAL | 3 refills | Status: DC

## 2019-01-12 NOTE — Assessment & Plan Note (Addendum)
Diabetes is uncontrolled A1C 14% in office  Discussed importance of checking her blood sugar every day.  Discussed importance of stopping the soda sweet treats junk food that she has been eating.  Recommend that she stick with water.  We will have her continue the metformin I am checking the renal function. Start Marcelline Deist Will see if dietary changes along with additional oral meds will help bring down CBG first before going back to insulin therapy Discussed possibly GLP 1 weekly therapy as well

## 2019-01-12 NOTE — Assessment & Plan Note (Addendum)
Discussed she can walk in her neighborhood, staying 6 feet away or do home exercises  While some of her weight loss is intentional I am concerned that the uncontrolled diabetes is also contributing

## 2019-01-12 NOTE — Assessment & Plan Note (Signed)
Continue statin drug, check LFT and lipids

## 2019-01-12 NOTE — Patient Instructions (Signed)
Check your blood sugars fasting and record Take metformin with the farxiga We will call with lab results Drink only water F/U IN 4 weeks for diabetes, bring your meter

## 2019-01-12 NOTE — Progress Notes (Signed)
Subjective:    Patient ID: Mariah [name], female    DOB: 03/27/1976, 43 y.o.   MRN: [id number]  Patient presents for DM (is fasting)  Patient here to follow-up diabetes mellitus.  She had a telephone visit on May 6.  Her last visit was in June 2019 at that time her A1c was 7.1%.  She is currently taking metformin thousand milligrams twice a day but states that her fasting blood sugars have been high. Last CBG 260 fasting, has not been taking She has not been eating well- admits to junk food, eating out a lot   Hyperlipidemia she is taking Lipitor daily.  Did restart the lisinopril 2.5 mg states that she had felt tired with it advised her to try it until our visit today. She does still feel a little tired denies dizziness, chest pain, SOB  Weight down 18lbs since last June, some intentional she was walking 5-6 miles while working, now sedentary staying at home during American FinancialCOVID19 quarentine.   The end of the visit states that a few weeks ago she was having significant palpitations especially at nighttime.  They are much improved but she still gets them occasionally.  She admits that she was working a lot of hours at that time 12-hour shifts multiple days in a row was also very anxious about the COVID.  But in generalshe has not been taking care of herself.  Review Of Systems:  GEN- denies fatigue, fever, weight loss,weakness, recent illness HEENT- denies eye drainage, change in vision, nasal discharge, CVS- denies chest pain, +palpitations RESP- denies SOB, cough, wheeze ABD- denies N/V, change in stools, abd pain GU- denies dysuria, hematuria, dribbling, incontinence MSK- denies joint pain, muscle aches, injury Neuro- denies headache, dizziness, syncope, seizure activity       Objective:    BP 124/66   Pulse (!) 102   Temp 98.2 F (36.8 C) (Oral)   Resp 16   Ht 5\' 7"  (1.702 m)   Wt 250 lb (113.4 kg)   SpO2 97%   BMI 39.16 kg/m  GEN- NAD, alert and oriented x3 HEENT- PERRL,  EOMI, non injected sclera, pink conjunctiva, MMM, oropharynx clear Neck- Supple, no thyromegaly CVS- mild tachycardia HR 100, no murmur RESP-CTAB ABD-NABS,soft,NT,ND EXT- No edema Psych- normal affect and mood Pulses- Radial, DP- 2+  EKG- NSR, mild tachycardia A1C > 14%     Assessment & Plan:      Problem List Items Addressed This Visit      Unprioritized   Diabetes mellitus without complication (HCC) - Primary    Diabetes is uncontrolled A1C 14% in office  Discussed importance of checking her blood sugar every day.  Discussed importance of stopping the soda sweet treats junk food that she has been eating.  Recommend that she stick with water.  We will have her continue the metformin I am checking the renal function. Start Marcelline DeistFarxiga Will see if dietary changes along with additional oral meds will help bring down CBG first before going back to insulin therapy Discussed possibly GLP 1 weekly therapy as well      Relevant Medications   dapagliflozin propanediol (FARXIGA) 10 MG TABS tablet   Other Relevant Orders   CBC with Differential/Platelet   Comprehensive metabolic panel   Microalbumin / creatinine urine ratio   Hemoglobin A1C, fingerstick (Completed)   HM DIABETES FOOT EXAM (Completed)   Hyperlipidemia    Continue statin drug, check LFT and lipids       Relevant Orders  Lipid panel   Obesity    Discussed she can walk in her neighborhood, staying 6 feet away or do home exercises  While some of her weight loss is intentional I am concerned that the uncontrolled diabetes is also contributing       Relevant Medications   dapagliflozin propanediol (FARXIGA) 10 MG TABS tablet    Other Visit Diagnoses    Palpitations       Concern palpitations though improved are fron the uncontrolled DM, check lytes, TSH, avoid caffiene, will also help tachycardia   Relevant Orders   TSH   EKG 12-Lead (Completed)      Note: This dictation was prepared with Dragon dictation along  with smaller phrase technology. Any transcriptional errors that result from this process are unintentional.

## 2019-01-13 LAB — MICROALBUMIN / CREATININE URINE RATIO
Creatinine, Urine: 195 mg/dL (ref 20–275)
Microalb Creat Ratio: 19 mcg/mg creat (ref <30)
Microalb, Ur: 3.7 mg/dL

## 2019-01-29 ENCOUNTER — Telehealth: Payer: Self-pay

## 2019-01-29 NOTE — Telephone Encounter (Signed)
Pt called and stated that she would like a Dr. Note extension. Please advise.

## 2019-01-30 NOTE — Telephone Encounter (Signed)
Spoke with pt and she states she would need the Dr. Phoebe Sharps extended from June 15-July 30.

## 2019-01-30 NOTE — Telephone Encounter (Signed)
Call pt back get clarification on what she is asking for time wise.

## 2019-02-02 ENCOUNTER — Encounter: Payer: Self-pay | Admitting: Family Medicine

## 2019-02-02 NOTE — Telephone Encounter (Signed)
Letter written, please print and fax.

## 2019-02-02 NOTE — Telephone Encounter (Signed)
Patient called in requesting that the letter be faxed to [facility] with the win number: [phone] listed on fax

## 2019-02-03 NOTE — Telephone Encounter (Signed)
Letter printed and faxed

## 2019-02-09 ENCOUNTER — Ambulatory Visit (INDEPENDENT_AMBULATORY_CARE_PROVIDER_SITE_OTHER): Payer: BC Managed Care – PPO | Admitting: Family Medicine

## 2019-02-09 ENCOUNTER — Other Ambulatory Visit: Payer: Self-pay

## 2019-02-09 VITALS — BP 128/76 | HR 107 | Temp 99.3°F | Resp 18 | Ht 67.0 in | Wt 252.0 lb

## 2019-02-09 DIAGNOSIS — Z6839 Body mass index (BMI) 39.0-39.9, adult: Secondary | ICD-10-CM

## 2019-02-09 DIAGNOSIS — E7800 Pure hypercholesterolemia, unspecified: Secondary | ICD-10-CM

## 2019-02-09 DIAGNOSIS — E119 Type 2 diabetes mellitus without complications: Secondary | ICD-10-CM | POA: Diagnosis not present

## 2019-02-09 MED ORDER — DAPAGLIFLOZIN PROPANEDIOL 10 MG PO TABS: 10.0000 mg | ORAL_TABLET | Freq: Every day | ORAL | 0 refills | Status: DC

## 2019-02-09 NOTE — Progress Notes (Signed)
   Subjective:    Patient ID: Mariah [name], female    DOB: September 19, 1975, 43 y.o.   MRN: [id number]  Patient presents for Diabetes   Pt here to f/u diabetes.  Started the far [employer] despite her A1c being greater than 14% she is been continued on the metformin twice a day only.  She states that she read the side effects and was scared but did not alert the office.  She however does not want to go back on insulin if at all possible and today is willing to take the medication seen that her blood sugars are still high though improved.  Change in her diet she has readings from May through June.  All fasting Week of May 11th  353,298, 3050,310,   May 23- 248>180>169>   June -204,240,   222 this AM  Hypoglycemia events no headache no shortness of breath no dizzy spells Been drinking only water has cut out soda and juice   Review Of Systems:  GEN- denies fatigue, fever, weight loss,weakness, recent illness HEENT- denies eye drainage, change in vision, nasal discharge, CVS- denies chest pain, palpitations RESP- denies SOB, cough, wheeze ABD- denies N/V, change in stools, abd pain GU- denies dysuria, hematuria, dribbling, incontinence MSK- denies joint pain, muscle aches, injury Neuro- denies headache, dizziness, syncope, seizure activity       Objective:    BP 128/76   Pulse (!) 107   Temp 99.3 F (37.4 C)   Resp 18   Ht 5\' 7"  (1.702 m)   Wt 252 lb (114.3 kg)   SpO2 97%   BMI 39.47 kg/m  GEN- NAD, alert and oriented x3 HEENT- PERRL, EOMI, non injected sclera, pink conjunctiva, MMM, oropharynx clear CVS- RR, HR  90 , no murmur RESP-CTAB EXT- No edema Pulses- Radial  2+        Assessment & Plan:      Problem List Items Addressed This Visit      Unprioritized   Diabetes mellitus without complication ([place])    Diabetes significantly uncontrolled her her fastings have improved now in the 200s because of dietary changes.  Recommend she continue with a low-carb we will refer her  to diabetes education.  Recommended she take the far [place] along with the metformin she is willing to do this. Trying to avoid insulin therapy again  Continue low dose ACEI for renal protection      Relevant Medications   dapagliflozin propanediol (FARXIGA) 10 MG TABS tablet   Other Relevant Orders   Ambulatory referral to diabetic education   Hyperlipidemia    Cholesterol at goal, no change to statin      Obesity - Primary   Relevant Medications   dapagliflozin propanediol (FARXIGA) 10 MG TABS tablet   Other Relevant Orders   Ambulatory referral to diabetic education      Note: This dictation was prepared with Dragon dictation along with smaller phrase technology. Any transcriptional errors that result from this process are unintentional.

## 2019-02-09 NOTE — Patient Instructions (Addendum)
Referral to diabetic education/nutrition Start farxiga 10mg  once a day  Continue metformin  F/U 2 months

## 2019-02-10 ENCOUNTER — Encounter: Payer: Self-pay | Admitting: Family Medicine

## 2019-02-10 NOTE — Assessment & Plan Note (Signed)
Cholesterol at goal, no change to statin

## 2019-02-10 NOTE — Assessment & Plan Note (Addendum)
Diabetes significantly uncontrolled her her fastings have improved now in the 200s because of dietary changes.  Recommend she continue with a low-carb we will refer her to diabetes education.  Recommended she take the far [place] along with the metformin she is willing to do this. Trying to avoid insulin therapy again  Continue low dose ACEI for renal protection

## 2019-02-26 ENCOUNTER — Encounter: Payer: Self-pay | Admitting: Nutrition

## 2019-02-26 ENCOUNTER — Other Ambulatory Visit: Payer: Self-pay | Admitting: Family Medicine

## 2019-02-26 ENCOUNTER — Encounter: Payer: BC Managed Care – PPO | Attending: Family Medicine | Admitting: Nutrition

## 2019-02-26 ENCOUNTER — Other Ambulatory Visit: Payer: Self-pay

## 2019-02-26 VITALS — Ht 67.0 in | Wt 250.0 lb

## 2019-02-26 DIAGNOSIS — E1165 Type 2 diabetes mellitus with hyperglycemia: Secondary | ICD-10-CM | POA: Insufficient documentation

## 2019-02-26 DIAGNOSIS — IMO0002 Reserved for concepts with insufficient information to code with codable children: Secondary | ICD-10-CM

## 2019-02-26 DIAGNOSIS — E118 Type 2 diabetes mellitus with unspecified complications: Secondary | ICD-10-CM | POA: Insufficient documentation

## 2019-02-26 NOTE — Progress Notes (Signed)
Telephone visit Medical Nutrition Therapy:  Appt start time: 0800 end time:  0900.   Assessment:  Primary concerns today: Diabetes Type 2. Dx 3 years. Sees Dr. Jeanice Limurham. Eats 2-3 meals. Most meals cooked at home in crock pot and baked. . Metformin 1000 mg BID. Farxiga 10 mg daily. Physical activity-walks some. Not working right now. Last A1C was > 14%.  Changes made: Eating more vegetables, salads, drinking water and baking her foods. Had been drinking a lot of sodas before and eating a lot of junk food. Is working on changing her food choices and exercise.. FBS: 140-200's. Only testing in the morning. Willing to make changes with diet and exercise to improve her DM. . Lab Results  Component Value Date   HGBA1C >14.0 (H) 01/12/2019   CMP Latest Ref Rng & Units 01/12/2019 02/26/2018 10/02/2017  Glucose 65 - 99 mg/dL 161(W320(H) 960(A115(H) 91  BUN 7 - 25 mg/dL 11 11 14   Creatinine 0.50 - 1.10 mg/dL 5.400.64 9.810.78 1.910.70  Sodium 135 - 146 mmol/L 135 135 136  Potassium 3.5 - 5.3 mmol/L 4.3 4.6 4.1  Chloride 98 - 110 mmol/L 103 102 104  CO2 20 - 32 mmol/L 21 24 22   Calcium 8.6 - 10.2 mg/dL 9.6 9.6 9.5  Total Protein 6.1 - 8.1 g/dL 6.8 7.3 6.9  Total Bilirubin 0.2 - 1.2 mg/dL 0.5 0.5 0.3  AST 10 - 30 U/L 7(L) 10 11  ALT 6 - 29 U/L 9 11 7    Lipid Panel     Component Value Date/Time   CHOL 145 01/12/2019 1033   TRIG 68 01/12/2019 1033   HDL 34 (L) 01/12/2019 1033   CHOLHDL 4.3 01/12/2019 1033   LDLCALC 96 01/12/2019 1033    Preferred Learning Style:   Visual and Read   Learning Readiness:    Ready  Change in progress   MEDICATIONS:    DIETARY INTAKE:  24-hr recall:  B ( AM): Omelets, [place] bacon , or sandwich Snk ( AM): cheese crackers or fruit or nuts L ( PM): skipped, water Snk ( PM):  D ( PM): chicken, cabbage, water Snk ( PM): Beverages: water  Usual physical activity:  Walks some.  Estimated energy needs: 1200  calories 135 g carbohydrates 90 g protein 33 g  fat  Progress Towards Goal(s):  In progress.   Nutritional Diagnosis:  NB-1.1 Food and nutrition-related knowledge deficit As related to Diabetes Type 2.  As evidenced by A1C >   1 4%.    Intervention:  Nutrition and Diabetes education provided on My Plate, CHO counting, meal planning, portion sizes, timing of meals, avoiding snacks between meals unless having a low blood sugar, target ranges for A1C and blood sugars, signs/symptoms and treatment of hyper/hypoglycemia, monitoring blood sugars, taking medications as prescribed, benefits of exercising 30 minutes per day and prevention of complications of DM.  Goals Follow My Plate-eat 47-8230-45 grams of carbs per meal. Eat meals on time Test blood sugars twice a day Exercise 60 minutes 3 times per week Drink 100 oz of water per day Lose 1 lb per week Avoid snacks between meals. Get A1C down to 7%  Teaching Method Utilized:  Visual Auditory   Handouts given during visit include:  The Plate Method   Barriers to learning/adherence to lifestyle change: none  Demonstrated degree of understanding via:  Teach Back   Monitoring/Evaluation:  Dietary intake, exercise, , and body weight in 1 month(s). Would recommend to consider a GLP1 instead of a SGLT2  due to risks of [place] gangrene with her morbid obesity.

## 2019-02-26 NOTE — Patient Instructions (Addendum)
Goals Follow My Plate Eat meals on time Test blood sugars twice a day Exercise 60 minutes 3 times per week Drink 100 oz of water per day Lose 1 lb per week Avoid snacks between meals. Get A1C down to 7%

## 2019-03-16 ENCOUNTER — Telehealth: Payer: Self-pay | Admitting: *Deleted

## 2019-03-16 NOTE — Telephone Encounter (Signed)
noted 

## 2019-03-16 NOTE — Telephone Encounter (Signed)
Received call from patient. [phone]~ telephone.   Reports that she is working at [employer] in [employer] at this time. States that she is concerned about increased risk of COVID D/T DM.   Requested letter to stay out of work.   Appointment scheduled for telephone visit on 03/17/2019.

## 2019-03-17 ENCOUNTER — Ambulatory Visit (INDEPENDENT_AMBULATORY_CARE_PROVIDER_SITE_OTHER): Payer: BC Managed Care – PPO | Admitting: Family Medicine

## 2019-03-17 ENCOUNTER — Other Ambulatory Visit: Payer: Self-pay

## 2019-03-17 DIAGNOSIS — E119 Type 2 diabetes mellitus without complications: Secondary | ICD-10-CM | POA: Diagnosis not present

## 2019-03-17 MED ORDER — GLIPIZIDE 5 MG PO TABS: 5.0000 mg | ORAL_TABLET | Freq: Two times a day (BID) | ORAL | 3 refills | Status: DC

## 2019-03-17 NOTE — Progress Notes (Signed)
Virtual Visit via Telephone Note  I connected with Mariah [name] on 03/17/19 at 3:49pm by telephone and verified that I am speaking with the correct person using two identifiers.    Pt location: at home   Physician location:  In office, [facility], Vic Blackbird MD     On call: patient and physician   I discussed the limitations, risks, security and privacy concerns of performing an evaluation and management service by telephone and the availability of in person appointments. I also discussed with the patient that there may be a patient responsible charge related to this service. The patient expressed understanding and agreed to proceed.   History of Present Illness: Telephone visit to discuss her returning to work for [employer].  She would like to stay out of work for another month.  Would like to try returning on August 31.  She does have diabetes mellitus which is uncontrolled.  She is at high risk for complications.  She currently works at [employer] in [employer] section and they are unable to provide distancing or the protective equipment at the counters based on the set up.  She is concerned about her overall exposures and prefers to stay out of work for now.  Diabetes mellitus she states that her blood sugars have been improved.  They have been less than 200.  She has had her fasting down into the 120s and 130s.  She also met with dietitian.  She has been on metformin and [place] but states that the [place] makes her itch every time she takes it.  She would like to discontinue this medication. He has not had any hypoglycemia symptoms   Observations/Objective: NAD noted over phone  Assessment and Plan: Diabetes mellitus uncontrolled but her fastings are improving.  Unfortunately she is unable to tolerate the medication.  We will change her to glipizide 5 mg twice a day she does not want to do an injectable she will continue the metformin at thousand milligrams twice a day.   She is at high risk for COVID complications so will extend her work note through August 31.  Follow Up Instructions:    I discussed the assessment and treatment plan with the patient. The patient was provided an opportunity to ask questions and all were answered. The patient agreed with the plan and demonstrated an understanding of the instructions.   The patient was advised to call back or seek an in-person evaluation if the symptoms worsen or if the condition fails to improve as anticipated.  I provided 16 minutes of non-face-to-face time during this encounter. End Time 4:05pm  Vic Blackbird, MD

## 2019-03-18 ENCOUNTER — Encounter: Payer: Self-pay | Admitting: Family Medicine

## 2019-03-28 ENCOUNTER — Other Ambulatory Visit: Payer: Self-pay | Admitting: Family Medicine

## 2019-03-30 ENCOUNTER — Other Ambulatory Visit: Payer: Self-pay | Admitting: Family Medicine

## 2019-04-09 ENCOUNTER — Telehealth: Payer: Self-pay

## 2019-04-09 NOTE — Telephone Encounter (Signed)
Pt called to report chest pain and back pain after taking the glipizide so pt has stopped taking it. No other symptoms to report. Pain started 03/19/2019. Since then chest and back pain have subsided. Please advise.

## 2019-04-10 ENCOUNTER — Other Ambulatory Visit: Payer: Self-pay

## 2019-04-10 MED ORDER — BD PEN NEEDLE MICRO U/F 32G X 6 MM MISC: 3 refills | Status: DC

## 2019-04-10 MED ORDER — TRESIBA FLEXTOUCH 100 UNIT/ML ~~LOC~~ SOPN: 10.0000 [IU] | PEN_INJECTOR | Freq: Every day | SUBCUTANEOUS | 11 refills | Status: DC

## 2019-04-10 NOTE — Telephone Encounter (Signed)
Since she has been given [place] and glipizide and had side effects She needs to restart [place] insulin 10 units at bedtime, continue metformin, her blood sugars are too high and metformin is not enough Get OV in 2 weeks with meter in office   Send script

## 2019-04-10 NOTE — Telephone Encounter (Signed)
Call placed to patient and patient made aware.   Prescription sent to pharmacy.   Appointment scheduled.  

## 2019-04-13 ENCOUNTER — Ambulatory Visit: Payer: BC Managed Care – PPO | Admitting: Family Medicine

## 2019-04-16 ENCOUNTER — Other Ambulatory Visit: Payer: Self-pay | Admitting: *Deleted

## 2019-04-16 MED ORDER — TOUJEO SOLOSTAR 300 UNIT/ML ~~LOC~~ SOPN: 10.0000 [IU] | PEN_INJECTOR | Freq: Every day | SUBCUTANEOUS | 11 refills | Status: DC

## 2019-04-20 ENCOUNTER — Telehealth: Payer: Self-pay | Admitting: *Deleted

## 2019-04-20 NOTE — Telephone Encounter (Signed)
Received call from patient [phone]~ telephone.   Requested to extend work note D/T [place]. Advised that OV required to extend leave. Telehealth visit scheduled for 04/24/2019.

## 2019-04-24 ENCOUNTER — Other Ambulatory Visit: Payer: Self-pay

## 2019-04-24 ENCOUNTER — Encounter: Payer: Self-pay | Admitting: Family Medicine

## 2019-04-24 ENCOUNTER — Ambulatory Visit (INDEPENDENT_AMBULATORY_CARE_PROVIDER_SITE_OTHER): Payer: BC Managed Care – PPO | Admitting: Family Medicine

## 2019-04-24 DIAGNOSIS — E119 Type 2 diabetes mellitus without complications: Secondary | ICD-10-CM | POA: Diagnosis not present

## 2019-04-24 DIAGNOSIS — Z7189 Other specified counseling: Secondary | ICD-10-CM | POA: Diagnosis not present

## 2019-04-24 MED ORDER — TOUJEO SOLOSTAR 300 UNIT/ML ~~LOC~~ SOPN: 10.0000 [IU] | PEN_INJECTOR | Freq: Every day | SUBCUTANEOUS | 11 refills | Status: DC

## 2019-04-24 MED ORDER — BD PEN NEEDLE MICRO U/F 32G X 6 MM MISC: 3 refills | Status: DC

## 2019-04-24 MED ORDER — LANTUS SOLOSTAR 100 UNIT/ML ~~LOC~~ SOPN: PEN_INJECTOR | SUBCUTANEOUS | 3 refills | Status: DC

## 2019-04-24 NOTE — Progress Notes (Signed)
Virtual Visit via Telephone Note  I connected with Mariah [name] on 04/24/19 at 4:17pm by telephone and verified that I am speaking with the correct person using two identifiers.       Pt location: at home   Physician location:  In office, [facility], Vic Blackbird MD     On call: patient and physician   I discussed the limitations, risks, security and privacy concerns of performing an evaluation and management service by telephone and the availability of in person appointments. I also discussed with the patient that there may be a patient responsible charge related to this service. The patient expressed understanding and agreed to proceed.   History of Present Illness: Pt needs note extended for work, currently on leave due to COVID-19 and her being high risk due to Diabetes mellitus. Works at [employer]. 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 Note she is not on disability, only having her position held during this time She does not have any current symptoms  DM- could not tolerate glipizide caused chest pain, prescribed Tujeo but ? Cost so has not started yet.   also planning to f/u with her dietician    Observations/Objective: NAD noted over phone     Assessment and Plan: Diabetes mellitus uncontrolled - continue metformin- will see if Lantus is covered - 10 units at bedtime. F/U on 9/21 in office for labs     She is at high risk for COVID complications so will extend her work note through November 1st    Fax for  [phone] - [place]    Follow Up Instructions:    I discussed the assessment and treatment plan with the patient. The patient was provided an opportunity to ask questions and all were answered. The patient agreed with the plan and demonstrated an understanding of the instructions.   The patient was advised to call back or seek an in-person evaluation if the  symptoms worsen or if the condition fails to improve as anticipated.  I provided 12 minutes of non-face-to-face time during this encounter. END TIME: 4:29pm  Vic Blackbird, MD

## 2019-04-27 ENCOUNTER — Ambulatory Visit: Payer: BC Managed Care – PPO | Admitting: Nutrition

## 2019-04-27 ENCOUNTER — Ambulatory Visit: Payer: Self-pay | Admitting: Family Medicine

## 2019-04-28 ENCOUNTER — Encounter: Payer: BC Managed Care – PPO | Attending: Family Medicine | Admitting: Nutrition

## 2019-04-28 ENCOUNTER — Encounter: Payer: Self-pay | Admitting: Nutrition

## 2019-04-28 DIAGNOSIS — E118 Type 2 diabetes mellitus with unspecified complications: Secondary | ICD-10-CM | POA: Insufficient documentation

## 2019-04-28 DIAGNOSIS — E7800 Pure hypercholesterolemia, unspecified: Secondary | ICD-10-CM

## 2019-04-28 DIAGNOSIS — E1165 Type 2 diabetes mellitus with hyperglycemia: Secondary | ICD-10-CM | POA: Insufficient documentation

## 2019-04-28 DIAGNOSIS — IMO0002 Reserved for concepts with insufficient information to code with codable children: Secondary | ICD-10-CM

## 2019-04-28 NOTE — Progress Notes (Signed)
Telephone visit Medical Nutrition Therapy:  Appt start time: 1330 end time:  1400   Assessment:  Primary concerns today: Diabetes Type 2. Dx 3 years. Sees Dr. Buelah Manis. Eats 2-3 meals. Most meals cooked at home in crock pot and baked. . Metformin 1000 mg BID.   Stopped taking Fargixa due to vaginal infections. Physical activity-walks some 1-2 miles.  Hasn't stated taking insulin yet due to checking with insurance company about coverage. She notes she is covered witih Lantus or Toujeo. Suppose to start taking 10 units a day. FBS: 190-318 mg/dl. In the last 2 week. Hasn't started insulin yet.  Going to get it and start today.  Trying to eat better balanced meals. Needs to get up earlier and eat meals on time. Motivated to make further changes to improve her DM.   . Lab Results  Component Value Date   HGBA1C >14.0 (H) 01/12/2019   CMP Latest Ref Rng & Units 01/12/2019 02/26/2018 10/02/2017  Glucose 65 - 99 mg/dL 320(H) 115(H) 91  BUN 7 - 25 mg/dL 11 11 14   Creatinine 0.50 - 1.10 mg/dL 0.64 0.78 0.70  Sodium 135 - 146 mmol/L 135 135 136  Potassium 3.5 - 5.3 mmol/L 4.3 4.6 4.1  Chloride 98 - 110 mmol/L 103 102 104  CO2 20 - 32 mmol/L 21 24 22   Calcium 8.6 - 10.2 mg/dL 9.6 9.6 9.5  Total Protein 6.1 - 8.1 g/dL 6.8 7.3 6.9  Total Bilirubin 0.2 - 1.2 mg/dL 0.5 0.5 0.3  AST 10 - 30 U/L 7(L) 10 11  ALT 6 - 29 U/L 9 11 7    Lipid Panel     Component Value Date/Time   CHOL 145 01/12/2019 1033   TRIG 68 01/12/2019 1033   HDL 34 (L) 01/12/2019 1033   CHOLHDL 4.3 01/12/2019 1033   LDLCALC 96 01/12/2019 1033    Preferred Learning Style:   Visual and Read   Learning Readiness:    Ready  Change in progress   MEDICATIONS:    DIETARY INTAKE:  24-hr recall:  B ( AM): 2 eggs and  brussel sprouts,  Snk ( AM):  L ( PM): Chicken, salad,  Apple water Snk ( PM):  D ( PM): stomboli  Snk ( PM): Beverages: water  Usual physical activity:  Walks some.  Estimated energy needs: 1200   calories 135 g carbohydrates 90 g protein 33 g fat  Progress Towards Goal(s):  In progress.   Nutritional Diagnosis:  NB-1.1 Food and nutrition-related knowledge deficit As related to Diabetes Type 2.  As evidenced by A1C >   1 4%.    Intervention:  Nutrition and Diabetes education provided on My Plate, CHO counting, meal planning, portion sizes, timing of meals, avoiding snacks between meals unless having a low blood sugar, target ranges for A1C and blood sugars, signs/symptoms and treatment of hyper/hypoglycemia, monitoring blood sugars, taking medications as prescribed, benefits of exercising 30 minutes per day and prevention of complications of DM.  Goals Start takign 10 units at night daily in abdomen. Follow My Plate-eat 30-45 grams of carbs per meal.-still working on doing better.  Eat meals on time- doing better.  Test blood sugars twice a day Exercise 60 minutes 3 times per week Drink 100 oz of water per day-doing well. Lose 1 lb per week Avoid snacks between meals.-done well. Get A1C down to 7%  Teaching Method Utilized:  Visual Auditory   Handouts given during visit include:  The Plate Method   Barriers to  learning/adherence to lifestyle change: none  Demonstrated degree of understanding via:  Teach Back   Monitoring/Evaluation:  Dietary intake, exercise, , and body weight in 1 month(s). May consider adding Januvia.

## 2019-04-28 NOTE — Patient Instructions (Signed)
Goals Start takign 10 units at night daily in abdomen. Follow My Plate-eat 30-45 grams of carbs per meal.-still working on doing better.  Eat meals on time- doing better.  Test blood sugars twice a day Exercise 60 minutes 3 times per week Drink 100 oz of water per day-doing well. Lose 1 lb per week Avoid snacks between meals.-done well. Get A1C down to 7%

## 2019-04-29 ENCOUNTER — Other Ambulatory Visit: Payer: Self-pay | Admitting: *Deleted

## 2019-04-29 MED ORDER — TOUJEO MAX SOLOSTAR 300 UNIT/ML ~~LOC~~ SOPN: 10.0000 [IU] | PEN_INJECTOR | Freq: Every day | SUBCUTANEOUS | 11 refills | Status: DC

## 2019-05-22 ENCOUNTER — Other Ambulatory Visit: Payer: Self-pay

## 2019-05-25 ENCOUNTER — Other Ambulatory Visit: Payer: Self-pay

## 2019-05-25 ENCOUNTER — Encounter: Payer: Self-pay | Admitting: Family Medicine

## 2019-05-25 ENCOUNTER — Ambulatory Visit (INDEPENDENT_AMBULATORY_CARE_PROVIDER_SITE_OTHER): Payer: BC Managed Care – PPO | Admitting: Family Medicine

## 2019-05-25 VITALS — BP 130/66 | HR 88 | Temp 98.4°F | Resp 14 | Ht 67.0 in | Wt 258.0 lb

## 2019-05-25 DIAGNOSIS — E66813 Obesity, class 3: Secondary | ICD-10-CM

## 2019-05-25 DIAGNOSIS — E119 Type 2 diabetes mellitus without complications: Secondary | ICD-10-CM

## 2019-05-25 DIAGNOSIS — F17200 Nicotine dependence, unspecified, uncomplicated: Secondary | ICD-10-CM

## 2019-05-25 DIAGNOSIS — E7800 Pure hypercholesterolemia, unspecified: Secondary | ICD-10-CM | POA: Diagnosis not present

## 2019-05-25 DIAGNOSIS — Z6841 Body Mass Index (BMI) 40.0 and over, adult: Secondary | ICD-10-CM

## 2019-05-25 MED ORDER — GLIPIZIDE 5 MG PO TABS: 5.0000 mg | ORAL_TABLET | Freq: Two times a day (BID) | ORAL | 3 refills | Status: DC

## 2019-05-25 NOTE — Assessment & Plan Note (Addendum)
Check A1c diabetes has been uncontrolled but her fastings are much improved with a combination of glipizide and metformin along with dietary changes and exercise.  Goal will be to get her A1c less than 7%.  She would also benefit from significant weight loss to assist in this goal.  She is on low-dose ACE inhibitor to help for renal protection.  I am been to recheck her cholesterol , continue lipitor   Pt to schedule eye exam

## 2019-05-25 NOTE — Patient Instructions (Signed)
Schedule eye appointment We will call with lab results F/U 3 months

## 2019-05-25 NOTE — Progress Notes (Signed)
   Subjective:    Patient ID: Mariah [name], female    DOB: [birth year], 43 y.o.   MRN: [id number]  Patient presents for Follow-up (is fasting)  Patient here to follow-up diabetes mellitus.  Multiple telehealth visits secondary to notations for her work as she has high risk and is currently on medical leave in the setting of COVID-19  She is walking 2 miles in the morning    DM- Last A1C > 14% in May  She is currently taking Metformin 1000mg  BID, Glipizide 5mg  twice a day.  At the last visit I recommended she start insulin as she states she had chest pain with a glipizide but then states that she cannot afford it so she went back to the glipizide has not had any problems since then.   she is working with dietician  CBG fasting past 2 weeks much improve have been 115-180, no hypoglycemia  evening have been below 180 after meals    She is working on tobacco cessation, smokes 1ppd , she has Nicorette gum and patches at home  Review Of Systems:  GEN- denies fatigue, fever, weight loss,weakness, recent illness HEENT- denies eye drainage, change in vision, nasal discharge, CVS- denies chest pain, palpitations RESP- denies SOB, cough, wheeze ABD- denies N/V, change in stools, abd pain GU- denies dysuria, hematuria, dribbling, incontinence MSK- denies joint pain, muscle aches, injury Neuro- denies headache, dizziness, syncope, seizure activity       Objective:    BP 130/66   Pulse 88   Temp 98.4 F (36.9 C) (Oral)   Resp 14   Ht 5\' 7"  (1.702 m)   Wt 258 lb (117 kg)   SpO2 99%   BMI 40.41 kg/m  GEN- NAD, alert and oriented x3 HEENT- PERRL, EOMI, non injected sclera, pink conjunctiva, MMM, oropharynx clear Neck- Supple, no thyromegaly CVS- RRR, no murmur RESP-CTAB ABD-NABS,soft,NT,ND EXT- No edema Pulses- Radial, DP- 2+        Assessment & Plan:      Problem List Items Addressed This Visit      Unprioritized   Diabetes mellitus without complication ([place]) -  Primary    Check A1c diabetes has been uncontrolled but her fastings are much improved with a combination of glipizide and metformin along with dietary changes and exercise.  Goal will be to get her A1c less than 7%.  She would also benefit from significant weight loss to assist in this goal.  She is on low-dose ACE inhibitor to help for renal protection.  I am been to recheck her cholesterol , continue lipitor   Pt to schedule eye exam      Relevant Medications   glipiZIDE (GLUCOTROL) 5 MG tablet   Other Relevant Orders   CBC with Differential/Platelet   Comprehensive metabolic panel   Lipid panel   Hemoglobin A1c   HM DIABETES FOOT EXAM (Completed)   Hyperlipidemia   Relevant Orders   Lipid panel   Obesity   Relevant Medications   glipiZIDE (GLUCOTROL) 5 MG tablet   Tobacco use disorder      Note: This dictation was prepared with Dragon dictation along with smaller phrase technology. Any transcriptional errors that result from this process are unintentional.

## 2019-05-26 LAB — COMPREHENSIVE METABOLIC PANEL
AG Ratio: 1.4 (calc) (ref 1.0–2.5)
ALT: 9 U/L (ref 6–29)
AST: 10 U/L (ref 10–30)
Albumin: 4.1 g/dL (ref 3.6–5.1)
Alkaline phosphatase (APISO): 69 U/L (ref 31–125)
BUN: 11 mg/dL (ref 7–25)
CO2: 23 mmol/L (ref 20–32)
Calcium: 9.4 mg/dL (ref 8.6–10.2)
Chloride: 105 mmol/L (ref 98–110)
Creat: 0.72 mg/dL (ref 0.50–1.10)
Globulin: 3 g/dL (calc) (ref 1.9–3.7)
Glucose, Bld: 113 mg/dL — ABNORMAL HIGH (ref 65–99)
Potassium: 4.4 mmol/L (ref 3.5–5.3)
Sodium: 137 mmol/L (ref 135–146)
Total Bilirubin: 0.4 mg/dL (ref 0.2–1.2)
Total Protein: 7.1 g/dL (ref 6.1–8.1)

## 2019-05-26 LAB — CBC WITH DIFFERENTIAL/PLATELET
Absolute Monocytes: 530 cells/uL (ref 200–950)
Basophils Absolute: 62 cells/uL (ref 0–200)
Basophils Relative: 0.6 %
Eosinophils Absolute: 343 cells/uL (ref 15–500)
Eosinophils Relative: 3.3 %
HCT: 38 % (ref 35.0–45.0)
Hemoglobin: 12.5 g/dL (ref 11.7–15.5)
Lymphs Abs: 3682 cells/uL (ref 850–3900)
MCH: 26.4 pg — ABNORMAL LOW (ref 27.0–33.0)
MCHC: 32.9 g/dL (ref 32.0–36.0)
MCV: 80.3 fL (ref 80.0–100.0)
MPV: 10.2 fL (ref 7.5–12.5)
Monocytes Relative: 5.1 %
Neutro Abs: 5782 cells/uL (ref 1500–7800)
Neutrophils Relative %: 55.6 %
Platelets: 333 10*3/uL (ref 140–400)
RBC: 4.73 10*6/uL (ref 3.80–5.10)
RDW: 13 % (ref 11.0–15.0)
Total Lymphocyte: 35.4 %
WBC: 10.4 10*3/uL (ref 3.8–10.8)

## 2019-05-26 LAB — HEMOGLOBIN A1C
Hgb A1c MFr Bld: 8.9 % of total Hgb — ABNORMAL HIGH (ref <5.7)
Mean Plasma Glucose: 209 (calc)
eAG (mmol/L): 11.6 (calc)

## 2019-05-26 LAB — LIPID PANEL
Cholesterol: 166 mg/dL (ref <200)
HDL: 37 mg/dL — ABNORMAL LOW (ref >=50)
LDL Cholesterol (Calc): 115 mg/dL (calc) — ABNORMAL HIGH
Non-HDL Cholesterol (Calc): 129 mg/dL (calc) (ref <130)
Total CHOL/HDL Ratio: 4.5 (calc) (ref <5.0)
Triglycerides: 54 mg/dL (ref <150)

## 2019-06-23 ENCOUNTER — Ambulatory Visit: Payer: BC Managed Care – PPO | Admitting: Nutrition

## 2019-06-24 ENCOUNTER — Encounter: Payer: Self-pay | Admitting: Family Medicine

## 2019-06-24 ENCOUNTER — Ambulatory Visit (INDEPENDENT_AMBULATORY_CARE_PROVIDER_SITE_OTHER): Payer: BC Managed Care – PPO | Admitting: Family Medicine

## 2019-06-24 DIAGNOSIS — Z7189 Other specified counseling: Secondary | ICD-10-CM | POA: Diagnosis not present

## 2019-06-24 DIAGNOSIS — E119 Type 2 diabetes mellitus without complications: Secondary | ICD-10-CM

## 2019-06-24 NOTE — Progress Notes (Signed)
Virtual Visit via Telephone Note  I connected with Mariah [name] on 06/24/19 at  12:57pm by telephone and verified that I am speaking with the correct person using two identifiers.     Pt location: at home   Physician location:  In office, [facility], Vic Blackbird MD     On call: patient and physician   I discussed the limitations, risks, security and privacy concerns of performing an evaluation and management service by telephone and the availability of in person appointments. I also discussed with the patient that there may be a patient responsible charge related to this service. The patient expressed understanding and agreed to proceed.   History of Present Illness: Patient requires ongoing documentation for her job.  She is currently not working for [employer] and says she is high risk for complications related to COVID-19 if she were to become infected.  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  She would like to extend her leave to January 1 due to the increase in COVID-19 cases in her area  Diabetes mellitus she has been working with a dietitian she is walking daily her fasting sugars are now down on average 130s  She does not have any other concerns   Observations/Objective: No acute distress noted on the phone  Assessment and Plan: Diabetes mellitus uncontrolled but much improved.  She is on Metformin and glipizide follow with dietitian she is also exercising.  She is at high risk for Covid complications secondary to her diabetes mellitus.  Her work note will be extended until September 04, 2019   Follow Up Instructions:    I discussed the assessment and treatment plan with the patient. The patient was provided an opportunity to ask questions and all were answered. The patient agreed with the plan and demonstrated an understanding of the instructions.   The patient was advised to call back or seek an in-person evaluation if the symptoms worsen or  if the condition fails to improve as anticipated.  I provided 9 minutes of non-face-to-face time during this encounter. End time: 1:06pm     Vic Blackbird, MD

## 2019-06-26 LAB — HM DIABETES EYE EXAM

## 2019-07-27 LAB — HM DIABETES EYE EXAM

## 2019-08-04 ENCOUNTER — Telehealth: Payer: BC Managed Care – PPO | Admitting: Nutrition

## 2019-08-19 ENCOUNTER — Other Ambulatory Visit: Payer: Self-pay

## 2019-08-19 ENCOUNTER — Encounter: Payer: Self-pay | Admitting: Nutrition

## 2019-08-19 ENCOUNTER — Encounter: Payer: BC Managed Care – PPO | Attending: Family Medicine | Admitting: Nutrition

## 2019-08-19 ENCOUNTER — Telehealth: Payer: Self-pay | Admitting: Nutrition

## 2019-08-19 DIAGNOSIS — IMO0002 Reserved for concepts with insufficient information to code with codable children: Secondary | ICD-10-CM

## 2019-08-19 DIAGNOSIS — E782 Mixed hyperlipidemia: Secondary | ICD-10-CM | POA: Diagnosis present

## 2019-08-19 DIAGNOSIS — E1165 Type 2 diabetes mellitus with hyperglycemia: Secondary | ICD-10-CM | POA: Insufficient documentation

## 2019-08-19 DIAGNOSIS — E118 Type 2 diabetes mellitus with unspecified complications: Secondary | ICD-10-CM | POA: Diagnosis present

## 2019-08-19 NOTE — Telephone Encounter (Signed)
Reminder call about mychart visit. Requested to call back if needed to reschedule.

## 2019-08-19 NOTE — Progress Notes (Signed)
Telephone visit Medical Nutrition Therapy:  Appt start time: 1330 end time:  1400   Assessment:  Primary concerns today: Diabetes Type 2. Dx 3 years. Sees Dr. Jeanice Lim. Metformin 100 mg BID. Glipizide 5 mg BID. FBS: 178,136, 151, 2229, 149, 198, 178 mg/dl. Not testing blood sugars at night but willing to start. Eating three meals per day. Has been walking 2- 3 miles a day most days of the week. Hasn't weighed self lately but last weight 250-255 lbs Didn't ever start insulin since blood sugars were better she notes.  Trying to eat better balanced meals. Needs to get up earlier and eat meals on time. Motivated to make further changes to improve her DM.  To see Dr. Jeanice Lim, Dec 28th for next appt and A1C check.  Lab Results  Component Value Date   HGBA1C 8.9 (H) 05/25/2019   CMP Latest Ref Rng & Units 05/25/2019 01/12/2019 02/26/2018  Glucose 65 - 99 mg/dL 546(E) 703(J) 009(F)  BUN 7 - 25 mg/dL 11 11 11   Creatinine 0.50 - 1.10 mg/dL 8.18 2.99  Sodium 135 - 146 mmol/L 137 135 135  Potassium 3.5 - 5.3 mmol/L 4.4 4.3 4.6  Chloride 98 - 110 mmol/L 105 103 102  CO2 20 - 32 mmol/L 23 21 24   Calcium 8.6 - 10.2 mg/dL 9.4 9.6 9.6  Total Protein 6.1 - 8.1 g/dL 7.1 6.8 7.3  Total Bilirubin 0.2 - 1.2 mg/dL 0.4 0.5 0.5  AST 10 - 30 U/L 10 7(L) 10  ALT 6 - 29 U/L 9 9 11    Lipid Panel     Component Value Date/Time   CHOL 166 05/25/2019 1124   TRIG 54 05/25/2019 1124   HDL 37 (L) 05/25/2019 1124   CHOLHDL 4.5 05/25/2019 1124   LDLCALC 115 (H) 05/25/2019 1124    Preferred Learning Style:   Visual and Read   Learning Readiness:    Ready  Change in progress   MEDICATIONS:    DIETARY INTAKE:  24-hr recall:  B ( AM):  Chicken, mushroom, onions and salad, water OR Eggs, sausage, cheese crissant.  Snk ( AM):  L ( PM): Same as lunch Snk ( PM):  D ( PM):  CHicken, mushrooms, green onions and salad, water Snk ( PM): Beverages: water  Usual physical activity:  Walks  some.  Estimated energy needs: 1200  calories 135 g carbohydrates 90 g protein 33 g fat  Progress Towards Goal(s):  In progress.   Nutritional Diagnosis:  NB-1.1 Food and nutrition-related knowledge deficit As related to Diabetes Type 2.  As evidenced by A1C >   1 4%.    Intervention:  Nutrition and Diabetes education provided on My Plate, CHO counting, meal planning, portion sizes, timing of meals, avoiding snacks between meals unless having a low blood sugar, target ranges for A1C and blood sugars, signs/symptoms and treatment of hyper/hypoglycemia, monitoring blood sugars, taking medications as prescribed, benefits of exercising 30 minutes per day and prevention of complications of DM.  Goals  Increase water intake -gallon a day. Eat meals on time Get rid of food that shouldn't be in the house Eat 30-45 grams of carbs per meal Keep walking 2-3 miles per day Lose 10 lbs a month Test blood sugars twice a day; less than 130 before breakfast and less than 150 at bedtime. Watch portions sizes Keep eating a lot of low carb vegetables. Get A1C to 7% or less.  Teaching Method Utilized:  Visual Auditory   Handouts given during  visit include:  The Plate Method   Barriers to learning/adherence to lifestyle change: none  Demonstrated degree of understanding via:  Teach Back   Monitoring/Evaluation:  Dietary intake, exercise, , and body weight in 3 month(s). May consider adding Januvia.

## 2019-08-19 NOTE — Patient Instructions (Addendum)
Goals  Increase water intake -gallon a day. Eat meals on time Get rid of food that shouldn't be in the house Eat 30-45 grams of carbs per meal Keep walking 2-3 miles per day Lose 10 lbs a month Test blood sugars twice a day; less than 130 before breakfast and less than 150 at bedtime. Watch portions sizes Keep eating a lot of low carb vegetables. Get A1C to 7% or less.

## 2019-08-24 ENCOUNTER — Ambulatory Visit: Payer: BC Managed Care – PPO | Admitting: Family Medicine

## 2019-08-31 ENCOUNTER — Ambulatory Visit (INDEPENDENT_AMBULATORY_CARE_PROVIDER_SITE_OTHER): Payer: BC Managed Care – PPO | Admitting: Family Medicine

## 2019-08-31 ENCOUNTER — Other Ambulatory Visit: Payer: Self-pay

## 2019-08-31 ENCOUNTER — Encounter: Payer: Self-pay | Admitting: Family Medicine

## 2019-08-31 VITALS — BP 132/78 | HR 112 | Temp 98.6°F | Resp 14 | Ht 67.0 in | Wt 269.0 lb

## 2019-08-31 DIAGNOSIS — F17200 Nicotine dependence, unspecified, uncomplicated: Secondary | ICD-10-CM

## 2019-08-31 DIAGNOSIS — E782 Mixed hyperlipidemia: Secondary | ICD-10-CM | POA: Diagnosis not present

## 2019-08-31 DIAGNOSIS — R079 Chest pain, unspecified: Secondary | ICD-10-CM | POA: Diagnosis not present

## 2019-08-31 DIAGNOSIS — E119 Type 2 diabetes mellitus without complications: Secondary | ICD-10-CM | POA: Diagnosis not present

## 2019-08-31 DIAGNOSIS — Z6841 Body Mass Index (BMI) 40.0 and over, adult: Secondary | ICD-10-CM

## 2019-08-31 NOTE — Progress Notes (Signed)
Subjective:    Patient ID: Mariah [name], female    DOB: [birth year], 43 y.o.   MRN: [id number]  Patient presents for Follow-up (is fasting)   Pt here to f/u chronic medical problems   DM- Last A1C 8.9%  Fasting 115- 229 , average 140-150's  Metormin 1000mg  BID, glipizide 5mg  BID   She is following with nutritionist , told to eat 30-45 carbs- She has not been weighing herself Still walk 5 days a week at minimum 2 miles  Weight up 11lbs since Sept  She admits to indulging during christmas  Hyperlipidemia she is taking lipitor 20mg  at bedtime  She is on ACEI for renal protection   She continues to smoke, but has cut back, pack last a 2-3 days now , she is planning to quit at the beginning of the year   States that she has had episodes of intermittent chest discomfort.  She points to the left side of her chest near the axilla.  No change with exertion no nausea or diaphoresis associated no GI symptoms.  No shortness of breath.  She has not had any cough or congestion.  Lasted a few minutes then went away she had an episode yesterday.  She does not recall any injury to the chest wall.    [facility]en [facility]   Review Of Systems:  GEN- denies fatigue, fever, weight loss,weakness, recent illness HEENT- denies eye drainage, change in vision, nasal discharge, CVS-  chest pain, palpitations RESP- denies SOB, cough, wheeze ABD- denies N/V, change in stools, abd pain GU- denies dysuria, hematuria, dribbling, incontinence MSK- denies joint pain, muscle aches, injury Neuro- denies headache, dizziness, syncope, seizure activity       Objective:    BP 132/78   Pulse (!) 112   Temp 98.6 F (37 C) (Temporal)   Resp 14   Ht 5\' 7"  (1.702 m)   Wt 269 lb (122 kg)   SpO2 99%   BMI 42.13 kg/m  GEN- NAD, alert and oriented x3 HEENT- PERRL, EOMI, non injected sclera, pink conjunctiva, MMM, oropharynx clear Neck- Supple, no thyromegaly CVS- RRR, no  murmur RESP-CTAB ABD-NABS,soft,NT,ND EXT- No edema Pulses- Radial, DP- 2+  EKG normal sinus rhythm no ST changes      Assessment & Plan:  Regarding her note for work she is high risk for complications for COVID-19 she would like to continue her leave from work at .  This is been extended to March 15 in the setting of COVID-19 in the community   Problem List Items Addressed This Visit      Unprioritized   Diabetes mellitus without complication (HCC) - Primary    Discussed dietary changes.  She is working with a .  Eats his own and on the nutrition increase her veggies and cut out some of her carbs and junk food to improve her overall diabetes and risk factors for heart disease.  Her EKG was reassuring in the office did not have any chest discomfort today advised that she gets recurrent pain especially with other symptoms including exertion shortness of breath etc. she should go to the nearest emergency room.  Check her fasting labs today.  She is on ACE inhibitor for renal protection and on statin drug.  Discussed tobacco cessation she is cutting back.      Relevant Orders   Comprehensive metabolic panel   Lipid Panel   Hemoglobin A1c   HM Diabetes Foot Exam (Completed)   CBC with  Differential   Hyperlipidemia   Relevant Orders   Lipid Panel   Obesity   Tobacco use disorder    Other Visit Diagnoses    Chest pain, unspecified type       Relevant Orders   CBC with Differential   EKG 12-Lead (Completed)      Note: This dictation was prepared with Dragon dictation along with smaller phrase technology. Any transcriptional errors that result from this process are unintentional.

## 2019-08-31 NOTE — Patient Instructions (Signed)
F/U 4 months  

## 2019-08-31 NOTE — Assessment & Plan Note (Signed)
Discussed dietary changes.  She is working with a Engineer, maintenance (IT).  Eats his own and on the nutrition increase her veggies and cut out some of her carbs and junk food to improve her overall diabetes and risk factors for heart disease.  Her EKG was reassuring in the office did not have any chest discomfort today advised that she gets recurrent pain especially with other symptoms including exertion shortness of breath etc. she should go to the nearest emergency room.  Check her fasting labs today.  She is on ACE inhibitor for renal protection and on statin drug.  Discussed tobacco cessation she is cutting back.

## 2019-09-01 LAB — LIPID PANEL
Cholesterol: 167 mg/dL (ref <200)
HDL: 41 mg/dL — ABNORMAL LOW (ref >=50)
LDL Cholesterol (Calc): 113 mg/dL (calc) — ABNORMAL HIGH
Non-HDL Cholesterol (Calc): 126 mg/dL (calc) (ref <130)
Total CHOL/HDL Ratio: 4.1 (calc) (ref <5.0)
Triglycerides: 45 mg/dL (ref <150)

## 2019-09-01 LAB — COMPREHENSIVE METABOLIC PANEL
AG Ratio: 1.4 (calc) (ref 1.0–2.5)
ALT: 9 U/L (ref 6–29)
AST: 9 U/L — ABNORMAL LOW (ref 10–30)
Albumin: 4 g/dL (ref 3.6–5.1)
Alkaline phosphatase (APISO): 76 U/L (ref 31–125)
BUN: 11 mg/dL (ref 7–25)
CO2: 20 mmol/L (ref 20–32)
Calcium: 9.6 mg/dL (ref 8.6–10.2)
Chloride: 102 mmol/L (ref 98–110)
Creat: 0.76 mg/dL (ref 0.50–1.10)
Globulin: 2.9 g/dL (calc) (ref 1.9–3.7)
Glucose, Bld: 177 mg/dL — ABNORMAL HIGH (ref 65–99)
Potassium: 4.5 mmol/L (ref 3.5–5.3)
Sodium: 137 mmol/L (ref 135–146)
Total Bilirubin: 0.3 mg/dL (ref 0.2–1.2)
Total Protein: 6.9 g/dL (ref 6.1–8.1)

## 2019-09-01 LAB — CBC WITH DIFFERENTIAL/PLATELET
Absolute Monocytes: 459 cells/uL (ref 200–950)
Basophils Absolute: 56 cells/uL (ref 0–200)
Basophils Relative: 0.5 %
Eosinophils Absolute: 426 cells/uL (ref 15–500)
Eosinophils Relative: 3.8 %
HCT: 39.3 % (ref 35.0–45.0)
Hemoglobin: 13 g/dL (ref 11.7–15.5)
Lymphs Abs: 3629 cells/uL (ref 850–3900)
MCH: 26.3 pg — ABNORMAL LOW (ref 27.0–33.0)
MCHC: 33.1 g/dL (ref 32.0–36.0)
MCV: 79.4 fL — ABNORMAL LOW (ref 80.0–100.0)
MPV: 9.8 fL (ref 7.5–12.5)
Monocytes Relative: 4.1 %
Neutro Abs: 6630 cells/uL (ref 1500–7800)
Neutrophils Relative %: 59.2 %
Platelets: 350 10*3/uL (ref 140–400)
RBC: 4.95 10*6/uL (ref 3.80–5.10)
RDW: 13.2 % (ref 11.0–15.0)
Total Lymphocyte: 32.4 %
WBC: 11.2 10*3/uL — ABNORMAL HIGH (ref 3.8–10.8)

## 2019-09-01 LAB — HEMOGLOBIN A1C
Hgb A1c MFr Bld: 9 % of total Hgb — ABNORMAL HIGH (ref <5.7)
Mean Plasma Glucose: 212 (calc)
eAG (mmol/L): 11.7 (calc)

## 2019-09-02 ENCOUNTER — Other Ambulatory Visit: Payer: Self-pay | Admitting: Family Medicine

## 2019-09-03 ENCOUNTER — Other Ambulatory Visit: Payer: Self-pay | Admitting: *Deleted

## 2019-09-03 MED ORDER — ATORVASTATIN CALCIUM 40 MG PO TABS: 40.0000 mg | ORAL_TABLET | Freq: Every day | ORAL | 1 refills | Status: DC

## 2019-09-03 MED ORDER — GLIPIZIDE 10 MG PO TABS: 10.0000 mg | ORAL_TABLET | Freq: Two times a day (BID) | ORAL | 1 refills | Status: DC

## 2019-09-11 ENCOUNTER — Encounter: Payer: Self-pay | Admitting: Family Medicine

## 2019-09-14 ENCOUNTER — Other Ambulatory Visit: Payer: Self-pay | Admitting: Family Medicine

## 2019-09-16 ENCOUNTER — Other Ambulatory Visit: Payer: Self-pay | Admitting: Family Medicine

## 2019-10-09 ENCOUNTER — Encounter: Payer: Self-pay | Admitting: Family Medicine

## 2019-11-01 ENCOUNTER — Other Ambulatory Visit: Payer: Self-pay | Admitting: Family Medicine

## 2019-11-14 ENCOUNTER — Ambulatory Visit: Payer: BC Managed Care – PPO | Attending: Internal Medicine

## 2019-11-14 DIAGNOSIS — Z23 Encounter for immunization: Secondary | ICD-10-CM

## 2019-11-14 NOTE — Progress Notes (Signed)
   Covid-19 Vaccination Clinic  Name:  NADEA KIRKLAND    MRN: [id number] DOB: April 05, 1976  11/14/2019  Ms. Humm was observed post Covid-19 immunization for 15 minutes without incident. She was provided with Vaccine Information Sheet and instruction to access the V-Safe system.   Ms. Spagnuolo was instructed to call 911 with any severe reactions post vaccine: Marland Kitchen Difficulty breathing  . Swelling of face and throat  . A fast heartbeat  . A bad rash all over body  . Dizziness and weakness   Immunizations Administered    Name Date Dose VIS Date Route   Moderna COVID-19 Vaccine 11/14/2019 12:39 PM 0.5 mL 08/04/2019 Intramuscular   Manufacturer: Moderna   Lot: [id number]   NDC: 62130-865-78

## 2019-11-18 ENCOUNTER — Telehealth (INDEPENDENT_AMBULATORY_CARE_PROVIDER_SITE_OTHER): Payer: BC Managed Care – PPO | Admitting: Family Medicine

## 2019-11-18 ENCOUNTER — Encounter: Payer: Self-pay | Admitting: Family Medicine

## 2019-11-18 DIAGNOSIS — Z6841 Body Mass Index (BMI) 40.0 and over, adult: Secondary | ICD-10-CM | POA: Diagnosis not present

## 2019-11-18 DIAGNOSIS — E119 Type 2 diabetes mellitus without complications: Secondary | ICD-10-CM | POA: Diagnosis not present

## 2019-11-18 DIAGNOSIS — Z7189 Other specified counseling: Secondary | ICD-10-CM | POA: Diagnosis not present

## 2019-11-18 NOTE — Progress Notes (Signed)
Virtual Visit via Video Note  I connected with Mariah [name] on 11/18/19 at 11:42 by a video enabled telemedicine application and verified that I am speaking with the correct person using two identifiers.      Pt location: at home   Physician location:  In office, [facility], Milinda Antis MD     On call: patient and physician   I discussed the limitations of evaluation and management by telemedicine and the availability of in person appointments. The patient expressed understanding and agreed to proceed.  History of Present Illness: Telehealth visit to follow-up current medical leave.  Patient is high risk for COVID-19 viral infection.  She has been on medical leave for most of the COVID-19 pandemic.  She was able to receive her first vaccine on March 13.  She is high risk secondary to her class III obesity as well as her diabetes mellitus and hyperlipidemia.  Class III obesity she has been trying to work on dietary changes has been working with diabetic nutritionist.  She admits that she overeats and has been snacking more.  Her fasting glucose this morning was 191.  She is taking her Metformin and her glipizide.  Her 24-hour recall she had pork chop Posta salad and water for breakfast yesterday, Snickers ice cream bar for snack, lunch steak with canned greens applesauce and water, snack oatmeal pie and she cannot recall her dinner.  This morning she had greens as well as a grilled chicken tender in pork sausage and water.  Has been trying to walk almost daily.  She did have one episode where she had a severe cramp in her left calf that lasted a couple days but that has now resolved.  She was worried it was related to her cholesterol medication.   Observations/Objective: No acute distress noted over the video ReSP- normal work of breathing PSYCH-  normal affect and mood  Assessment and Plan: Diabetes mellitus type 2/class III obesity high risk for COVID-19 viral  infection complications We will extend her work leave until May 2 she may return on May 3 with no restrictions at that point she has had both vaccines which also includes a 2-week wait.  After her second vaccine.  Diabetes mellitus we discussed some dietary changes she was previously journaling her nutrition.  We will have her go back to writing down what she is eating as that is helped her in the past from overeating.  We also discussed cutting out the extra snacks in between which typically are sugary snacks and baked goods.  We will increase water to help with hydration since she is walking daily I would not stop the statin drug at this time due to 1 episode of cramp.  Increase vegetables with each meal to keep her full  Follow-up in office on April 30 as scheduled will have labs at that time Follow Up Instructions:    I discussed the assessment and treatment plan with the patient. The patient was provided an opportunity to ask questions and all were answered. The patient agreed with the plan and demonstrated an understanding of the instructions.   The patient was advised to call back or seek an in-person evaluation if the symptoms worsen or if the condition fails to improve as anticipated.  I provided17 minutes of non-face-to-face time during this encounter. End Time 11:59  Milinda Antis, MD

## 2019-11-30 ENCOUNTER — Encounter: Payer: Self-pay | Admitting: Nutrition

## 2019-11-30 ENCOUNTER — Other Ambulatory Visit: Payer: Self-pay

## 2019-11-30 ENCOUNTER — Encounter: Payer: BC Managed Care – PPO | Attending: Family Medicine | Admitting: Nutrition

## 2019-11-30 VITALS — Ht 67.0 in | Wt 267.0 lb

## 2019-11-30 DIAGNOSIS — E1165 Type 2 diabetes mellitus with hyperglycemia: Secondary | ICD-10-CM | POA: Insufficient documentation

## 2019-11-30 DIAGNOSIS — IMO0002 Reserved for concepts with insufficient information to code with codable children: Secondary | ICD-10-CM

## 2019-11-30 DIAGNOSIS — E118 Type 2 diabetes mellitus with unspecified complications: Secondary | ICD-10-CM | POA: Insufficient documentation

## 2019-11-30 DIAGNOSIS — E782 Mixed hyperlipidemia: Secondary | ICD-10-CM

## 2019-11-30 DIAGNOSIS — F17200 Nicotine dependence, unspecified, uncomplicated: Secondary | ICD-10-CM | POA: Insufficient documentation

## 2019-11-30 NOTE — Patient Instructions (Signed)
Goal  Work on meal prepping Increase exercise to 60 minutes 5 days per week Increase lower carb vegetables Cut out snacks between meals Drink only water and cut out Sweet tea Call Dr. Jeanice Lim office and see about restarting long acting insulin Lose 1-2 lbs per week Get A1C down to 7%

## 2019-11-30 NOTE — Progress Notes (Signed)
Telephone visit Medical Nutrition Therapy:  Appt start time: 1000 end time:  1030  Assessment:  Primary concerns today: Diabetes Type 2. Dx 3 years. Sees Dr. Buelah Manis. Metformin 100 mg BID. Glipizide 5 mg BID.   Last A1C 9%. Previous A1C 8.9%. Blood sugars are trending up. Use to be on insulin and got off of it when she was eating better and lost weight. She feels she can do a lot better with her eating habits. Walks daily for 2 miles. Sometimes twice a day if nice. Drinking more water but not a gallon a day.. Cut out sodas but drinks sweet tea.. Still has trouble with resisting fast/processed foods and high calorie snacks. FBS 238 this am. Been running 200-230's in am consistently.. Evenings( usually forgets) before supper 170's. Eating three meals per day.  Struggles with snacks and eating out. Use to cook at home more and has gotten away from that. Willing to get back on track and meal prep and exercise more aggressively and consistently.  Elevated LDL. Strong family history of heart disease. She is at high risk for a cardiovascular event. She does smoke also. Stressed need for dietary changes, weight loss, stop smoking and getting back on insulin for needed blood sugar control.   Lab Results  Component Value Date   HGBA1C 9.0 (H) 08/31/2019   CMP Latest Ref Rng & Units 08/31/2019 05/25/2019 01/12/2019  Glucose 65 - 99 mg/dL 177(H) 113(H) 320(H)  BUN 7 - 25 mg/dL 11 11 11   Creatinine 0.50 - 1.10 mg/dL 0.76 0.72 0.64  Sodium 135 - 146 mmol/L 137 137 135  Potassium 3.5 - 5.3 mmol/L 4.5 4.4 4.3  Chloride 98 - 110 mmol/L 102 105 103  CO2 20 - 32 mmol/L 20 23 21   Calcium 8.6 - 10.2 mg/dL 9.6 9.4 9.6  Total Protein 6.1 - 8.1 g/dL 6.9 7.1 6.8  Total Bilirubin 0.2 - 1.2 mg/dL 0.3 0.4 0.5  AST 10 - 30 U/L 9(L) 10 7(L)  ALT 6 - 29 U/L 9 9 9    Lipid Panel     Component Value Date/Time   CHOL 167 08/31/2019 1009   TRIG 45 08/31/2019 1009   HDL 41 (L) 08/31/2019 1009   CHOLHDL 4.1  08/31/2019 1009   LDLCALC 113 (H) 08/31/2019 1009    Preferred Learning Style:   Visual and Read   Learning Readiness:    Ready  Change in progress   MEDICATIONS:    DIETARY INTAKE:  24-hr recall:  B ( AM):  Chicken, mushroom, onions and salad, water OR Eggs, sausage, cheese crissant.  Snk ( AM):  L ( PM): Same as lunch Snk ( PM):  D ( PM):  CHicken, mushrooms, green onions and salad, water Snk ( PM): Beverages: water  Usual physical activity:  Walks some.  Estimated energy needs: 1200  calories 135 g carbohydrates 90 g protein 33 g fat  Progress Towards Goal(s):  In progress.   Nutritional Diagnosis:  NB-1.1 Food and nutrition-related knowledge deficit As related to Diabetes Type 2.  As evidenced by A1C >   1 4%.    Intervention:  Nutrition and Diabetes education provided on My Plate, CHO counting, meal planning, portion sizes, timing of meals, avoiding snacks between meals unless having a low blood sugar, target ranges for A1C and blood sugars, signs/symptoms and treatment of hyper/hypoglycemia, monitoring blood sugars, taking medications as prescribed, benefits of exercising 30 minutes per day and prevention of complications of DM.   Goal  Work  on meal prepping Increase exercise to 60 minutes 5 days per week Increase lower carb vegetables Cut out snacks between meals Drink only water and cut out Sweet tea Call Dr. Jeanice Lim office and see about restarting long acting insulin Lose 1-2 lbs per week Get A1C down to 7% Check blood sugars 4 times a day and call Dr. Rhona Raider office in 3-4 days and see if she wants to start you on insulin before your next appt.  Teaching Method Utilized:  Visual Auditory   Handouts given during visit include:  The Plate Method   Barriers to learning/adherence to lifestyle change: none  Demonstrated degree of understanding via:  Teach Back   Monitoring/Evaluation:  Dietary intake, exercise, , and body weight in 3  month(s). Recommend to start long acting insulin to help get blood sugars back down.

## 2019-12-03 ENCOUNTER — Ambulatory Visit (INDEPENDENT_AMBULATORY_CARE_PROVIDER_SITE_OTHER): Payer: BC Managed Care – PPO | Admitting: Obstetrics & Gynecology

## 2019-12-03 ENCOUNTER — Encounter: Payer: Self-pay | Admitting: Obstetrics & Gynecology

## 2019-12-03 ENCOUNTER — Other Ambulatory Visit: Payer: Self-pay

## 2019-12-03 ENCOUNTER — Other Ambulatory Visit (HOSPITAL_COMMUNITY)
Admission: RE | Admit: 2019-12-03 | Discharge: 2019-12-03 | Disposition: A | Discharge status: Home / Self Care | Payer: BC Managed Care – PPO | Source: Ambulatory Visit | Attending: Obstetrics & Gynecology | Admitting: Obstetrics & Gynecology

## 2019-12-03 VITALS — BP 115/83 | HR 118 | Wt 270.0 lb

## 2019-12-03 DIAGNOSIS — Z8742 Personal history of other diseases of the female genital tract: Secondary | ICD-10-CM | POA: Insufficient documentation

## 2019-12-03 DIAGNOSIS — Z01419 Encounter for gynecological examination (general) (routine) without abnormal findings: Secondary | ICD-10-CM | POA: Insufficient documentation

## 2019-12-03 DIAGNOSIS — Z1231 Encounter for screening mammogram for malignant neoplasm of breast: Secondary | ICD-10-CM | POA: Diagnosis not present

## 2019-12-03 NOTE — Progress Notes (Signed)
GYNECOLOGY ANNUAL PREVENTATIVE CARE ENCOUNTER NOTE  History:     Mariah [name] is a 44 y.o. G0P0000 female here for a routine annual gynecologic exam.  Current complaints: none.   Denies abnormal vaginal bleeding, discharge, pelvic pain, problems with intercourse or other gynecologic concerns.    Gynecologic History No LMP recorded. (Menstrual status: Irregular Periods). Contraception: condoms Last Pap: 06/05/2018. Results were: normal with negative HPV Last mammogram: 07/10/2017. Results were: normal  Obstetric History OB History  Gravida Para Term Preterm AB Living  0 0 0 0 0 0  SAB TAB Ectopic Multiple Live Births  0 0 0 0 0    Past Medical History:  Diagnosis Date  . Abnormal Pap smear 09/26/10   ASCUS  . Abnormal uterine bleeding   . Asthma    Albuterol Inhaler last used 7/12  . Diabetes ([place])   . Diabetes mellitus without complication ([place])   . Hypercholesteremia   . Morbid obesity ([place])   . Simple endometrial hyperplasia without atypia     Past Surgical History:  Procedure Laterality Date  . APPENDECTOMY    . CYSTECTOMY  1989  . ENDOMETRIAL BIOPSY  02/13/11   AUB  . HYSTEROSCOPY WITH D & C  04/16/2011   Procedure: DILATATION AND CURETTAGE (D&C) /HYSTEROSCOPY;  Surgeon: Osborne Oman, MD;  Location: [facility];  Service: Gynecology;  Laterality: N/A;  Diagnostic  . polyp removal  2010    Current Outpatient Medications on File Prior to Visit  Medication Sig Dispense Refill  . atorvastatin (LIPITOR) 40 MG tablet Take 1 tablet (40 mg total) by mouth daily. 90 tablet 1  . glipiZIDE (GLUCOTROL) 10 MG tablet Take 1 tablet (10 mg total) by mouth 2 (two) times daily before a meal. 180 tablet 1  . lisinopril (ZESTRIL) 2.5 MG tablet TAKE 1 TABLET(2.5 MG) BY MOUTH DAILY 30 tablet 6  . megestrol (MEGACE) 40 MG tablet TAKE 1 TABLET BY MOUTH ONCE DAILY - CAN INCREASE TO 2 TABLETS EVERY DAY IN THE EVENT OF HEAVY BLEEDING 90 tablet 4  . metFORMIN (GLUCOPHAGE) 1000 MG  tablet TAKE 1 TABLET BY MOUTH TWICE DAILY WITH MEALS 180 tablet 0  . Multiple Vitamin (MULTIVITAMIN WITH MINERALS) TABS tablet Take 1 tablet by mouth daily.    . Multiple Vitamins-Minerals (AIRBORNE PO) Take by mouth.     No current facility-administered medications on file prior to visit.    Allergies  Allergen Reactions  . Farxiga [Dapagliflozin] Itching    Social History:  reports that she has been smoking cigarettes. She has a 14.00 pack-year smoking history. She has never used smokeless tobacco. She reports current alcohol use. She reports that she does not use drugs.  Family History  Problem Relation Age of Onset  . Diabetes Mother   . Heart disease Mother   . Hyperlipidemia Mother   . Diabetes Maternal Grandmother   . Diabetes Maternal Grandfather   . Cancer Paternal Grandmother   . Breast cancer Paternal Grandmother     The following portions of the patient's history were reviewed and updated as appropriate: allergies, current medications, past family history, past medical history, past social history, past surgical history and problem list.  Review of Systems Pertinent items noted in HPI and remainder of comprehensive ROS otherwise negative.  Physical Exam:  BP 115/83   Pulse (!) 118   Wt 270 lb (122.5 kg)   BMI 42.29 kg/m  CONSTITUTIONAL: Well-developed, well-nourished female in no acute distress.  HENT:  Normocephalic, atraumatic, External right and left ear normal. Oropharynx is clear and moist EYES: Conjunctivae and EOM are normal. Pupils are equal, round, and reactive to light. No scleral icterus.  NECK: Normal range of motion, supple, no masses.  Normal thyroid.  SKIN: Skin is warm and dry. No rash noted. Not diaphoretic. No erythema. No pallor. MUSCULOSKELETAL: Normal range of motion. No tenderness.  No cyanosis, clubbing, or edema.   NEUROLOGIC: Alert and oriented to person, place, and time. Normal reflexes, muscle tone coordination. No cranial nerve deficit  noted. PSYCHIATRIC: Normal mood and affect. Normal behavior. Normal judgment and thought content. CARDIOVASCULAR: Normal heart rate noted, regular rhythm RESPIRATORY: Clear to auscultation bilaterally. Effort and breath sounds normal, no problems with respiration noted. BREASTS: Symmetric in size. No masses, tenderness, skin changes, nipple drainage, or lymphadenopathy bilaterally.  Performed in the presence of a chaperone. ABDOMEN: Soft, obese, no distention appreciated.  No tenderness, rebound or guarding.  PELVIC: Normal appearing external genitalia and urethral meatus; normal appearing vaginal mucosa and cervix.  Normal appearing discharge.  Pap smear obtained.  Unable to palpate uterus or adnexa secondary to habitus.  Performed in the presence of a chaperone.   Assessment and Plan:      1. Breast cancer screening by mammogram Mammogram scheduled - MM 3D SCREEN BREAST BILATERAL; Future  2. History of simple endometrial hyperplasia without atypia Diagnosed on endometrial biopsy 02/13/11.  Treated with Megace 40 mg daily.  Underwent diagnostic hysteroscopy, D&C on 04/16/11; same diagnosis. 2016: Simple hyperplasia without atypia on endometrial biopsy.  Needs yearly biopsy for now. 03/25/17: Proliferative endometrium, no hyperplasia on endometrial biopsy. Continue Megace, reevaluate next year. 06/05/18: Benign endometrium. Continue Megace, reevaluate in 1-2 years. Patient did not want biopsy today unless it is absolutely needed. Will consult with our GYN ONC colleagues for recommendations about further surveillance and let patient know.  3. Well woman exam with routine gynecological exam - Cervicovaginal ancillary only - Hepatitis B surface antigen - Hepatitis C antibody - RPR - HIV Antibody (routine testing w rflx) - Cytology - PAP Will follow up results of pap smear, annual STI screen and manage accordingly. Routine preventative health maintenance measures emphasized. Please refer to  After Visit Summary for other counseling recommendations.      Jaynie Collins, MD, FACOG Obstetrician & Gynecologist, [facility] for [employer], [facility]

## 2019-12-03 NOTE — Patient Instructions (Signed)

## 2019-12-04 LAB — CYTOLOGY - PAP
Comment: NEGATIVE
Diagnosis: NEGATIVE
High risk HPV: NEGATIVE

## 2019-12-04 LAB — CERVICOVAGINAL ANCILLARY ONLY
Bacterial Vaginitis (gardnerella): NEGATIVE
Candida Glabrata: NEGATIVE
Candida Vaginitis: NEGATIVE
Chlamydia: NEGATIVE
Comment: NEGATIVE
Comment: NEGATIVE
Comment: NEGATIVE
Comment: NEGATIVE
Comment: NEGATIVE
Comment: NORMAL
Neisseria Gonorrhea: NEGATIVE
Trichomonas: NEGATIVE

## 2019-12-04 LAB — RPR: RPR Ser Ql: NONREACTIVE

## 2019-12-04 LAB — HIV ANTIBODY (ROUTINE TESTING W REFLEX): HIV Screen 4th Generation wRfx: NONREACTIVE

## 2019-12-04 LAB — HEPATITIS B SURFACE ANTIGEN: Hepatitis B Surface Ag: NEGATIVE

## 2019-12-04 LAB — HEPATITIS C ANTIBODY: Hep C Virus Ab: <0.1 s/co ratio (ref 0.0–0.9)

## 2019-12-07 ENCOUNTER — Other Ambulatory Visit: Payer: Self-pay | Admitting: Family Medicine

## 2019-12-16 ENCOUNTER — Ambulatory Visit: Payer: BC Managed Care – PPO | Attending: Internal Medicine

## 2019-12-16 DIAGNOSIS — Z23 Encounter for immunization: Secondary | ICD-10-CM

## 2019-12-16 NOTE — Progress Notes (Signed)
   Covid-19 Vaccination Clinic  Name:  Mariah [name]    MRN: [id number] DOB: [birth year]  12/16/2019  [name] was observed post Covid-19 immunization for 30 minutes based on pre-vaccination screening without incident. She was provided with Vaccine Information Sheet and instruction to access the V-Safe system.   [name] was instructed to call 911 with any severe reactions post vaccine: Marland Kitchen Difficulty breathing  . Swelling of face and throat  . A fast heartbeat  . A bad rash all over body  . Dizziness and weakness   Immunizations Administered    Name Date Dose VIS Date Route   Moderna COVID-19 Vaccine 12/16/2019 12:23 PM 0.5 mL 08/04/2019 Intramuscular   Manufacturer: Moderna   Lot: [id number]   NDC: 48628-241-75

## 2019-12-18 ENCOUNTER — Other Ambulatory Visit: Payer: Self-pay | Admitting: Family Medicine

## 2019-12-19 ENCOUNTER — Other Ambulatory Visit: Payer: Self-pay | Admitting: Family Medicine

## 2019-12-28 ENCOUNTER — Other Ambulatory Visit: Payer: Self-pay | Admitting: Obstetrics & Gynecology

## 2020-01-01 ENCOUNTER — Ambulatory Visit: Payer: BC Managed Care – PPO | Admitting: Family Medicine

## 2020-01-01 ENCOUNTER — Encounter: Payer: Self-pay | Admitting: Family Medicine

## 2020-01-01 ENCOUNTER — Other Ambulatory Visit: Payer: Self-pay

## 2020-01-01 VITALS — BP 110/68 | HR 82 | Temp 98.0°F | Resp 16 | Ht 67.0 in | Wt 263.0 lb

## 2020-01-01 DIAGNOSIS — E669 Obesity, unspecified: Secondary | ICD-10-CM

## 2020-01-01 DIAGNOSIS — E119 Type 2 diabetes mellitus without complications: Secondary | ICD-10-CM | POA: Diagnosis not present

## 2020-01-01 DIAGNOSIS — E782 Mixed hyperlipidemia: Secondary | ICD-10-CM | POA: Diagnosis not present

## 2020-01-01 NOTE — Patient Instructions (Signed)
F/U 4 months  

## 2020-01-01 NOTE — Assessment & Plan Note (Signed)
Continue to work on dietary changes.  She is following with dietitian.

## 2020-01-01 NOTE — Assessment & Plan Note (Signed)
Recheck A1c diabetes has been uncontrolled. We will continue Metformin and glipizide.  She cannot tolerate SGLT2 therapy has tried [place].  Consider adding GLP-1 therapy if she is agreeable to injection such as Ozempic once weekly which will also aid with her weight.  Fasting labs obtained today.  She is cleared to return to work on Monday with no restrictions

## 2020-01-01 NOTE — Progress Notes (Signed)
   Subjective:    Patient ID: Mariah [name], female    DOB: 12-27-1975, 44 y.o.   MRN: [id number]  Patient presents for Follow-up (is fasting) and Forms (continued medical leave) Pt here to f.u chronic medical problems  Medications reviewed  DM- last A1C was 9% in December   Seen by [facility] in [place]  Her fasting blood sugars are typically higher than her evening ones.  Over the past month they have been improved but she still averaging a fasting blood sugar 1 60-200's in the afternoon 140s to 200s, she has not had any hypoglycemia episodes She does admit when she monitors her eating habits more closely her blood sugars are typically much more improved and she is not eating as much sugar and carbohydrates.  She is still walking twice a day.  She will be returning to work on Monday where she will be up on her feet as she works in [employer].    COVID-19 done on 4/14  No new problems today        Review Of Systems:  GEN- denies fatigue, fever, weight loss,weakness, recent illness HEENT- denies eye drainage, change in vision, nasal discharge, CVS- denies chest pain, palpitations RESP- denies SOB, cough, wheeze ABD- denies N/V, change in stools, abd pain GU- denies dysuria, hematuria, dribbling, incontinence MSK- denies joint pain, muscle aches, injury Neuro- denies headache, dizziness, syncope, seizure activity       Objective:    BP 110/68   Pulse 82   Temp 98 F (36.7 C) (Temporal)   Resp 16   Ht 5\' 7"  (1.702 m)   Wt 263 lb (119.3 kg)   SpO2 98%   BMI 41.19 kg/m  GEN- NAD, alert and oriented x3 HEENT- PERRL, EOMI, non injected sclera, pink conjunctiva, MMM, oropharynx clear Neck- Supple, no thyromegaly CVS- RRR, no murmur RESP-CTAB ABD-NABS,soft,NT,ND EXT- No edema Pulses- Radial, DP- 2+        Assessment & Plan:      Problem List Items Addressed This Visit      Unprioritized   Class 3 obesity    Continue to work on dietary changes.  She is  following with dietitian.      Diabetes mellitus without complication (HCC) - Primary    Recheck A1c diabetes has been uncontrolled. We will continue Metformin and glipizide.  She cannot tolerate SGLT2 therapy has tried .  Consider adding GLP-1 therapy if she is agreeable to injection such as Ozempic once weekly which will also aid with her weight.  Fasting labs obtained today.  She is cleared to return to work on Monday with no restrictions      Relevant Orders   CBC with Differential/Platelet   Comprehensive metabolic panel   Hemoglobin A1c   Hyperlipidemia    Recheck lipids along with liver function test.  She is on Lipitor 40 mg once a day.  Also on low-dose ACE inhibitor for renal protection      Relevant Orders   Lipid panel      Note: This dictation was prepared with Dragon dictation along with smaller phrase technology. Any transcriptional errors that result from this process are unintentional.

## 2020-01-01 NOTE — Assessment & Plan Note (Signed)
Recheck lipids along with liver function test.  She is on Lipitor 40 mg once a day.  Also on low-dose ACE inhibitor for renal protection

## 2020-01-02 LAB — CBC WITH DIFFERENTIAL/PLATELET
Absolute Monocytes: 433 cells/uL (ref 200–950)
Basophils Absolute: 62 cells/uL (ref 0–200)
Basophils Relative: 0.6 %
Eosinophils Absolute: 412 cells/uL (ref 15–500)
Eosinophils Relative: 4 %
HCT: 38.7 % (ref 35.0–45.0)
Hemoglobin: 12.6 g/dL (ref 11.7–15.5)
Lymphs Abs: 2925 cells/uL (ref 850–3900)
MCH: 26.5 pg — ABNORMAL LOW (ref 27.0–33.0)
MCHC: 32.6 g/dL (ref 32.0–36.0)
MCV: 81.5 fL (ref 80.0–100.0)
MPV: 9.9 fL (ref 7.5–12.5)
Monocytes Relative: 4.2 %
Neutro Abs: 6468 cells/uL (ref 1500–7800)
Neutrophils Relative %: 62.8 %
Platelets: 368 10*3/uL (ref 140–400)
RBC: 4.75 10*6/uL (ref 3.80–5.10)
RDW: 13.3 % (ref 11.0–15.0)
Total Lymphocyte: 28.4 %
WBC: 10.3 10*3/uL (ref 3.8–10.8)

## 2020-01-02 LAB — COMPREHENSIVE METABOLIC PANEL
AG Ratio: 1.5 (calc) (ref 1.0–2.5)
ALT: 11 U/L (ref 6–29)
AST: 12 U/L (ref 10–30)
Albumin: 4.3 g/dL (ref 3.6–5.1)
Alkaline phosphatase (APISO): 78 U/L (ref 31–125)
BUN: 13 mg/dL (ref 7–25)
CO2: 21 mmol/L (ref 20–32)
Calcium: 10 mg/dL (ref 8.6–10.2)
Chloride: 107 mmol/L (ref 98–110)
Creat: 0.83 mg/dL (ref 0.50–1.10)
Globulin: 2.9 g/dL (calc) (ref 1.9–3.7)
Glucose, Bld: 170 mg/dL — ABNORMAL HIGH (ref 65–99)
Potassium: 4.6 mmol/L (ref 3.5–5.3)
Sodium: 138 mmol/L (ref 135–146)
Total Bilirubin: 0.4 mg/dL (ref 0.2–1.2)
Total Protein: 7.2 g/dL (ref 6.1–8.1)

## 2020-01-02 LAB — HEMOGLOBIN A1C
Hgb A1c MFr Bld: 8.1 % of total Hgb — ABNORMAL HIGH (ref <5.7)
Mean Plasma Glucose: 186 (calc)
eAG (mmol/L): 10.3 (calc)

## 2020-01-02 LAB — LIPID PANEL
Cholesterol: 146 mg/dL (ref <200)
HDL: 34 mg/dL — ABNORMAL LOW (ref >=50)
LDL Cholesterol (Calc): 99 mg/dL (calc)
Non-HDL Cholesterol (Calc): 112 mg/dL (calc) (ref <130)
Total CHOL/HDL Ratio: 4.3 (calc) (ref <5.0)
Triglycerides: 52 mg/dL (ref <150)

## 2020-01-07 ENCOUNTER — Other Ambulatory Visit: Payer: Self-pay | Admitting: *Deleted

## 2020-01-07 MED ORDER — OZEMPIC (0.25 OR 0.5 MG/DOSE) 2 MG/1.5ML ~~LOC~~ SOPN: 0.2500 mg | PEN_INJECTOR | SUBCUTANEOUS | 1 refills | Status: DC

## 2020-01-14 ENCOUNTER — Encounter: Payer: Self-pay | Admitting: *Deleted

## 2020-01-21 ENCOUNTER — Other Ambulatory Visit: Payer: Self-pay | Admitting: *Deleted

## 2020-01-21 MED ORDER — GLIPIZIDE 10 MG PO TABS: 10.0000 mg | ORAL_TABLET | Freq: Two times a day (BID) | ORAL | 2 refills | Status: DC

## 2020-01-21 MED ORDER — METFORMIN HCL 1000 MG PO TABS: 1000.0000 mg | ORAL_TABLET | Freq: Two times a day (BID) | ORAL | 2 refills | Status: DC

## 2020-01-21 MED ORDER — LISINOPRIL 2.5 MG PO TABS: ORAL_TABLET | ORAL | 2 refills | Status: DC

## 2020-01-21 MED ORDER — ATORVASTATIN CALCIUM 40 MG PO TABS: 40.0000 mg | ORAL_TABLET | Freq: Every day | ORAL | 2 refills | Status: DC

## 2020-01-27 ENCOUNTER — Ambulatory Visit (HOSPITAL_COMMUNITY): Payer: BC Managed Care – PPO

## 2020-02-12 ENCOUNTER — Ambulatory Visit (HOSPITAL_COMMUNITY)
Admission: RE | Admit: 2020-02-12 | Discharge: 2020-02-12 | Disposition: A | Discharge status: Home / Self Care | Payer: BC Managed Care – PPO | Source: Ambulatory Visit | Attending: Obstetrics & Gynecology | Admitting: Obstetrics & Gynecology

## 2020-02-12 ENCOUNTER — Other Ambulatory Visit: Payer: Self-pay

## 2020-02-12 DIAGNOSIS — Z1231 Encounter for screening mammogram for malignant neoplasm of breast: Secondary | ICD-10-CM

## 2020-05-02 ENCOUNTER — Encounter: Payer: Self-pay | Admitting: Family Medicine

## 2020-05-02 ENCOUNTER — Ambulatory Visit: Payer: BC Managed Care – PPO | Admitting: Family Medicine

## 2020-05-02 ENCOUNTER — Other Ambulatory Visit: Payer: Self-pay

## 2020-05-02 VITALS — BP 130/64 | HR 90 | Temp 98.1°F | Resp 14 | Ht 67.0 in | Wt 256.0 lb

## 2020-05-02 DIAGNOSIS — E119 Type 2 diabetes mellitus without complications: Secondary | ICD-10-CM | POA: Diagnosis not present

## 2020-05-02 DIAGNOSIS — E782 Mixed hyperlipidemia: Secondary | ICD-10-CM

## 2020-05-02 DIAGNOSIS — E669 Obesity, unspecified: Secondary | ICD-10-CM

## 2020-05-02 DIAGNOSIS — M778 Other enthesopathies, not elsewhere classified: Secondary | ICD-10-CM | POA: Diagnosis not present

## 2020-05-02 MED ORDER — MELOXICAM 15 MG PO TABS
15.0000 mg | ORAL_TABLET | Freq: Every day | ORAL | 0 refills | Status: DC
Start: 2020-05-02 — End: 2021-05-19

## 2020-05-02 NOTE — Assessment & Plan Note (Addendum)
Fasting and evening CBG much improved Pt also working on dietary changes for weight loss which has improved her CBG and nutrition  Continue metformin and glipizide, low dose ACEI for renal protection  Goal A1C less than 7%

## 2020-05-02 NOTE — Progress Notes (Signed)
    Subjective:    Patient ID: Mariah [name], female    DOB: 09-26-1975, 44 y.o.   MRN: [id number]  Patient presents for Follow-up (is fasting) and R Hand Pain (carpal tunnel in thumb)  Pt here to f/u chronic medical problems. Medications reviewed.  DM- last A1C  8.1% in April , taking metformin 1000mg  BID, glipizide 10mg  BID I recommended added ozempic but due to cost did start  She walks a few days a week, waks 4 miles during work day , drinking more water  Fasting- 91-140, evening 90-140's  Weight down 7lbs since April  Right hand thumb at the base  gets sore, she works in , she has pain at the DIP on thumb for past few months, no swelling, Had an episode where they were tingling but that rarely happens     Hyperlipidemia taking lipitor 40mg  once a day       Review Of Systems:  GEN- denies fatigue, fever, weight loss,weakness, recent illness HEENT- denies eye drainage, change in vision, nasal discharge, CVS- denies chest pain, palpitations RESP- denies SOB, cough, wheeze ABD- denies N/V, change in stools, abd pain GU- denies dysuria, hematuria, dribbling, incontinence MSK- denies joint pain, muscle aches, injury Neuro- denies headache, dizziness, syncope, seizure activity       Objective:    BP 130/64   Pulse 90   Temp 98.1 F (36.7 C) (Temporal)   Resp 14   Ht 5\' 7"  (1.702 m)   Wt 256 lb (116.1 kg)   SpO2 98%   BMI 40.10 kg/m  GEN- NAD, alert and oriented x3 HEENT- PERRL, EOMI, non injected sclera, pink conjunctiva, MMM, oropharynx clear Neck- Supple, no thyromegaly CVS- RRR, no murmur RESP-CTAB ABD-NABS,soft,NT,ND EXT- No edema Pulses- Radial, DP- 2+        Assessment & Plan:      Problem List Items Addressed This Visit      Unprioritized   Class 3 obesity   Diabetes mellitus without complication (HCC)    Fasting and evening CBG much improved Pt also working on dietary changes for weight loss which has improved her CBG and  nutrition  Continue metformin and glipizide, low dose ACEI for renal protection  Goal A1C less than 7%       Relevant Orders   Comprehensive metabolic panel   Lipid panel   Hemoglobin A1c   CBC with Differential/Platelet   Hyperlipidemia    Continue statin drug  Goal LDL less than 100       Relevant Orders   Lipid panel    Other Visit Diagnoses    Tendinitis of thumb    -  Primary   Wrist splint given, due to repetitive motions, mobic given, topical voltaren      Note: This dictation was prepared with Dragon dictation along with smaller phrase technology. Any transcriptional errors that result from this process are unintentional.

## 2020-05-02 NOTE — Assessment & Plan Note (Signed)
Continue statin drug Goal LDL less than 100  

## 2020-05-02 NOTE — Patient Instructions (Addendum)
Use voltaren 1% gel topically 3-4 times a day as needed Diclofenac once a day for 2 weeks  F/U 4 months

## 2020-05-03 LAB — CBC WITH DIFFERENTIAL/PLATELET
Absolute Monocytes: 384 cells/uL (ref 200–950)
Basophils Absolute: 48 cells/uL (ref 0–200)
Basophils Relative: 0.5 %
Eosinophils Absolute: 413 cells/uL (ref 15–500)
Eosinophils Relative: 4.3 %
HCT: 41.4 % (ref 35.0–45.0)
Hemoglobin: 13.3 g/dL (ref 11.7–15.5)
Lymphs Abs: 3398 cells/uL (ref 850–3900)
MCH: 25.6 pg — ABNORMAL LOW (ref 27.0–33.0)
MCHC: 32.1 g/dL (ref 32.0–36.0)
MCV: 79.8 fL — ABNORMAL LOW (ref 80.0–100.0)
MPV: 9.7 fL (ref 7.5–12.5)
Monocytes Relative: 4 %
Neutro Abs: 5357 cells/uL (ref 1500–7800)
Neutrophils Relative %: 55.8 %
Platelets: 370 10*3/uL (ref 140–400)
RBC: 5.19 10*6/uL — ABNORMAL HIGH (ref 3.80–5.10)
RDW: 13.4 % (ref 11.0–15.0)
Total Lymphocyte: 35.4 %
WBC: 9.6 10*3/uL (ref 3.8–10.8)

## 2020-05-03 LAB — HEMOGLOBIN A1C
Hgb A1c MFr Bld: 7.8 % of total Hgb — ABNORMAL HIGH (ref <5.7)
Mean Plasma Glucose: 177 (calc)
eAG (mmol/L): 9.8 (calc)

## 2020-05-03 LAB — COMPREHENSIVE METABOLIC PANEL
AG Ratio: 1.4 (calc) (ref 1.0–2.5)
ALT: 10 U/L (ref 6–29)
AST: 7 U/L — ABNORMAL LOW (ref 10–30)
Albumin: 4.2 g/dL (ref 3.6–5.1)
Alkaline phosphatase (APISO): 80 U/L (ref 31–125)
BUN: 15 mg/dL (ref 7–25)
CO2: 21 mmol/L (ref 20–32)
Calcium: 9.9 mg/dL (ref 8.6–10.2)
Chloride: 105 mmol/L (ref 98–110)
Creat: 0.69 mg/dL (ref 0.50–1.10)
Globulin: 3.1 g/dL (calc) (ref 1.9–3.7)
Glucose, Bld: 151 mg/dL — ABNORMAL HIGH (ref 65–99)
Potassium: 4.3 mmol/L (ref 3.5–5.3)
Sodium: 136 mmol/L (ref 135–146)
Total Bilirubin: 0.5 mg/dL (ref 0.2–1.2)
Total Protein: 7.3 g/dL (ref 6.1–8.1)

## 2020-05-03 LAB — LIPID PANEL
Cholesterol: 191 mg/dL (ref <200)
HDL: 44 mg/dL — ABNORMAL LOW (ref >=50)
LDL Cholesterol (Calc): 133 mg/dL (calc) — ABNORMAL HIGH
Non-HDL Cholesterol (Calc): 147 mg/dL (calc) — ABNORMAL HIGH (ref <130)
Total CHOL/HDL Ratio: 4.3 (calc) (ref <5.0)
Triglycerides: 53 mg/dL (ref <150)

## 2020-05-05 ENCOUNTER — Other Ambulatory Visit: Payer: Self-pay | Admitting: *Deleted

## 2020-05-05 MED ORDER — ATORVASTATIN CALCIUM 80 MG PO TABS: 80.0000 mg | ORAL_TABLET | Freq: Every day | ORAL | 1 refills | Status: DC

## 2020-05-12 ENCOUNTER — Other Ambulatory Visit: Payer: Self-pay | Admitting: *Deleted

## 2020-05-12 MED ORDER — ATORVASTATIN CALCIUM 80 MG PO TABS
80.0000 mg | ORAL_TABLET | Freq: Every day | ORAL | 1 refills | Status: DC
Start: 2020-05-12 — End: 2020-12-09

## 2020-08-29 ENCOUNTER — Ambulatory Visit: Payer: BC Managed Care – PPO | Admitting: Family Medicine

## 2020-08-29 ENCOUNTER — Encounter: Payer: Self-pay | Admitting: Family Medicine

## 2020-08-29 ENCOUNTER — Other Ambulatory Visit: Payer: Self-pay

## 2020-08-29 VITALS — BP 128/83 | HR 97 | Temp 97.5°F | Resp 16 | Wt 265.0 lb

## 2020-08-29 DIAGNOSIS — E1165 Type 2 diabetes mellitus with hyperglycemia: Secondary | ICD-10-CM

## 2020-08-29 DIAGNOSIS — B3731 Acute candidiasis of vulva and vagina: Secondary | ICD-10-CM

## 2020-08-29 DIAGNOSIS — E669 Obesity, unspecified: Secondary | ICD-10-CM

## 2020-08-29 DIAGNOSIS — B373 Candidiasis of vulva and vagina: Secondary | ICD-10-CM

## 2020-08-29 DIAGNOSIS — E119 Type 2 diabetes mellitus without complications: Secondary | ICD-10-CM | POA: Diagnosis not present

## 2020-08-29 DIAGNOSIS — E782 Mixed hyperlipidemia: Secondary | ICD-10-CM

## 2020-08-29 DIAGNOSIS — E785 Hyperlipidemia, unspecified: Secondary | ICD-10-CM

## 2020-08-29 DIAGNOSIS — E1169 Type 2 diabetes mellitus with other specified complication: Secondary | ICD-10-CM

## 2020-08-29 MED ORDER — FLUCONAZOLE 150 MG PO TABS: ORAL_TABLET | ORAL | 0 refills | Status: DC

## 2020-08-29 NOTE — Patient Instructions (Addendum)
Rybelsus is the pill form of Ozempic  Diflucan for yeast infection F/U 3 months

## 2020-08-29 NOTE — Assessment & Plan Note (Signed)
Continue statin drug Goal LDL 100 or less

## 2020-08-29 NOTE — Assessment & Plan Note (Signed)
Reduce carbs in diet Discussed meal prep

## 2020-08-29 NOTE — Assessment & Plan Note (Addendum)
Goal is A1C less  7% Would benefit from GLP-1 therapy Discussed possibly adding oral since she is reluctant to do the shot Continue glipizide, MTF Pt to reestablish with her dietician Continue low dose ACEI

## 2020-08-29 NOTE — Progress Notes (Signed)
   Subjective:    Patient ID: Mariah [name], female    DOB: 1976/03/30, 44 y.o.   MRN: [id number]  Patient presents for Diabetes Mellitus Patient here to follow-up chronic medical problems.  Medications reviewed. Diabetes mellitus last A1c 7.8% in August she is currently on glipizide 10 mg twice daily and Metformin 1000 mg twice a day.  We discussed GLP-1 therapy but she has been reluctant to use this.  She has not been checking her CBG  she has not followed up with the dietician    She is still on megace from GYN, PAP Smear UTD  Mammogram UTD   Hyperlipidemia Lipitor was increased to 80 mg in August LDL was 133  She is on low-dose ACE inhibitor for renal protection as well as multivitamin  End of visit states Feels she has yeast infection has white itchy discharge , has had before especially  When sugar is high   Due for Urine Micro   Had eye exam [facility] [place] Review Of Systems:  GEN- denies fatigue, fever, weight loss,weakness, recent illness HEENT- denies eye drainage, change in vision, nasal discharge, CVS- denies chest pain, palpitations RESP- denies SOB, cough, wheeze ABD- denies N/V, change in stools, abd pain GU- denies dysuria, hematuria, dribbling, incontinence MSK- denies joint pain, muscle aches, injury Neuro- denies headache, dizziness, syncope, seizure activity       Objective:    BP 128/83   Pulse 97   Temp (!) 97.5 F (36.4 C)   Resp 16   Wt 265 lb (120.2 kg)   SpO2 99%   BMI 41.50 kg/m  GEN- NAD, alert and oriented x3 HEENT- PERRL, EOMI, non injected sclera, pink conjunctiva, MMM, oropharynx clear Neck- Supple, no thyromegaly CVS- RRR, no murmur RESP-CTAB ABD-NABS,soft,NT,ND EXT- No edema Pulses- Radial, DP- 2+        Assessment & Plan:      Problem List Items Addressed This Visit      Unprioritized   Class 3 obesity    Reduce carbs in diet Discussed meal prep      DM (diabetes mellitus), type 2, uncontrolled (HCC) -  Primary    Goal is A1C less  7% Would benefit from GLP-1 therapy Discussed possibly adding oral since she is reluctant to do the shot Continue glipizide, MTF Pt to reestablish with her dietician Continue low dose ACEI      Hyperlipidemia    Continue statin drug Goal LDL 100 or less       Relevant Orders   Lipid panel    Other Visit Diagnoses    Vaginal yeast infection       Treat with diflucan   Relevant Medications   fluconazole (DIFLUCAN) 150 MG tablet      Note: This dictation was prepared with Dragon dictation along with smaller phrase technology. Any transcriptional errors that result from this process are unintentional.

## 2020-08-30 LAB — LIPID PANEL
Cholesterol: 146 mg/dL (ref <200)
HDL: 36 mg/dL — ABNORMAL LOW (ref >=50)
LDL Cholesterol (Calc): 96 mg/dL (calc)
Non-HDL Cholesterol (Calc): 110 mg/dL (calc) (ref <130)
Total CHOL/HDL Ratio: 4.1 (calc) (ref <5.0)
Triglycerides: 54 mg/dL (ref <150)

## 2020-08-30 LAB — CBC WITH DIFFERENTIAL/PLATELET
Absolute Monocytes: 407 cells/uL (ref 200–950)
Basophils Absolute: 58 cells/uL (ref 0–200)
Basophils Relative: 0.6 %
Eosinophils Absolute: 378 cells/uL (ref 15–500)
Eosinophils Relative: 3.9 %
HCT: 41.8 % (ref 35.0–45.0)
Hemoglobin: 13.8 g/dL (ref 11.7–15.5)
Lymphs Abs: 3288 cells/uL (ref 850–3900)
MCH: 26.9 pg — ABNORMAL LOW (ref 27.0–33.0)
MCHC: 33 g/dL (ref 32.0–36.0)
MCV: 81.5 fL (ref 80.0–100.0)
MPV: 10 fL (ref 7.5–12.5)
Monocytes Relative: 4.2 %
Neutro Abs: 5568 cells/uL (ref 1500–7800)
Neutrophils Relative %: 57.4 %
Platelets: 360 10*3/uL (ref 140–400)
RBC: 5.13 10*6/uL — ABNORMAL HIGH (ref 3.80–5.10)
RDW: 12.7 % (ref 11.0–15.0)
Total Lymphocyte: 33.9 %
WBC: 9.7 10*3/uL (ref 3.8–10.8)

## 2020-08-30 LAB — COMPREHENSIVE METABOLIC PANEL
AG Ratio: 1.4 (calc) (ref 1.0–2.5)
ALT: 13 U/L (ref 6–29)
AST: 10 U/L (ref 10–30)
Albumin: 4.2 g/dL (ref 3.6–5.1)
Alkaline phosphatase (APISO): 101 U/L (ref 31–125)
BUN: 10 mg/dL (ref 7–25)
CO2: 22 mmol/L (ref 20–32)
Calcium: 9.5 mg/dL (ref 8.6–10.2)
Chloride: 107 mmol/L (ref 98–110)
Creat: 0.78 mg/dL (ref 0.50–1.10)
Globulin: 2.9 g/dL (calc) (ref 1.9–3.7)
Glucose, Bld: 256 mg/dL — ABNORMAL HIGH (ref 65–99)
Potassium: 4.2 mmol/L (ref 3.5–5.3)
Sodium: 140 mmol/L (ref 135–146)
Total Bilirubin: 0.6 mg/dL (ref 0.2–1.2)
Total Protein: 7.1 g/dL (ref 6.1–8.1)

## 2020-08-30 LAB — MICROALBUMIN / CREATININE URINE RATIO
Creatinine, Urine: 391 mg/dL — ABNORMAL HIGH (ref 20–275)
Microalb Creat Ratio: 27 mcg/mg creat (ref <30)
Microalb, Ur: 10.7 mg/dL

## 2020-08-30 LAB — HEMOGLOBIN A1C
Hgb A1c MFr Bld: 12.5 % of total Hgb — ABNORMAL HIGH (ref <5.7)
Mean Plasma Glucose: 312 mg/dL
eAG (mmol/L): 17.3 mmol/L

## 2020-09-06 MED ORDER — OZEMPIC (0.25 OR 0.5 MG/DOSE) 2 MG/1.5ML ~~LOC~~ SOPN: PEN_INJECTOR | SUBCUTANEOUS | 3 refills | Status: DC

## 2020-09-06 NOTE — Addendum Note (Signed)
Addended by: Milinda Antis F on: 09/06/2020 12:20 PM   Modules accepted: Orders

## 2020-10-27 ENCOUNTER — Encounter: Payer: Self-pay | Admitting: Radiology

## 2020-11-25 ENCOUNTER — Ambulatory Visit: Payer: BC Managed Care – PPO | Admitting: Family Medicine

## 2020-12-01 ENCOUNTER — Other Ambulatory Visit: Payer: Self-pay | Admitting: Family Medicine

## 2020-12-09 ENCOUNTER — Telehealth: Payer: Self-pay | Admitting: Family Medicine

## 2020-12-09 MED ORDER — METFORMIN HCL 1000 MG PO TABS: 1.0000 | ORAL_TABLET | Freq: Two times a day (BID) | ORAL | 0 refills | Status: DC

## 2020-12-09 MED ORDER — GLIPIZIDE 10 MG PO TABS: 10.0000 mg | ORAL_TABLET | Freq: Two times a day (BID) | ORAL | 0 refills | Status: DC

## 2020-12-09 MED ORDER — ATORVASTATIN CALCIUM 80 MG PO TABS: 80.0000 mg | ORAL_TABLET | Freq: Every day | ORAL | 0 refills | Status: DC

## 2020-12-09 MED ORDER — LISINOPRIL 2.5 MG PO TABS: ORAL_TABLET | ORAL | 0 refills | Status: DC

## 2020-12-09 NOTE — Telephone Encounter (Signed)
Patient called about recent request for med refills. Patient stated provider usually gives her a 90 day supply; 30 day supply sent. Patient requesting adjustment to give her time to find a new pcp.  Medications:  glipiZIDE (GLUCOTROL) 10 MG tablet [[id number]]  metFORMIN (GLUCOPHAGE) 1000 MG tablet [[id number]]  lisinopril (ZESTRIL) 2.5 MG tablet [[id number]]  atorvastatin (LIPITOR) 80 MG tablet [[id number]]   Pharmacy:   [facility] [address], [place] - [address] AT [facility] mail services  [address] [facility], [place] [address]  Phone:  [phone] Fax:  [phone]   Please call patient to inform when Rx called in at [phone].

## 2020-12-09 NOTE — Telephone Encounter (Signed)
Prescription sent to pharmacy.

## 2021-01-10 ENCOUNTER — Other Ambulatory Visit: Payer: Self-pay

## 2021-01-10 ENCOUNTER — Encounter: Payer: Self-pay | Admitting: Obstetrics & Gynecology

## 2021-01-10 ENCOUNTER — Other Ambulatory Visit (HOSPITAL_COMMUNITY)
Admission: RE | Admit: 2021-01-10 | Discharge: 2021-01-10 | Disposition: A | Discharge status: Home / Self Care | Payer: BC Managed Care – PPO | Source: Ambulatory Visit | Attending: Obstetrics & Gynecology | Admitting: Obstetrics & Gynecology

## 2021-01-10 ENCOUNTER — Ambulatory Visit (INDEPENDENT_AMBULATORY_CARE_PROVIDER_SITE_OTHER): Payer: BC Managed Care – PPO | Admitting: Obstetrics & Gynecology

## 2021-01-10 VITALS — BP 136/82 | HR 94 | Ht 68.0 in | Wt 261.0 lb

## 2021-01-10 DIAGNOSIS — Z8742 Personal history of other diseases of the female genital tract: Secondary | ICD-10-CM

## 2021-01-10 DIAGNOSIS — Z1231 Encounter for screening mammogram for malignant neoplasm of breast: Secondary | ICD-10-CM

## 2021-01-10 DIAGNOSIS — Z01419 Encounter for gynecological examination (general) (routine) without abnormal findings: Secondary | ICD-10-CM

## 2021-01-10 MED ORDER — MEGESTROL ACETATE 40 MG PO TABS: ORAL_TABLET | ORAL | 4 refills | Status: DC

## 2021-01-10 NOTE — Progress Notes (Signed)
GYNECOLOGY ANNUAL PREVENTATIVE CARE ENCOUNTER NOTE  History:     Mariah [name] is a 45 y.o. G0 female here for a routine annual gynecologic exam.  Current complaints: none.   Denies abnormal vaginal bleeding, discharge, pelvic pain, problems with intercourse or other gynecologic concerns.    Gynecologic History No LMP recorded. (Menstrual status: Other). Contraception: condoms Last Pap: 12/03/2019. Results were: normal with negative HPV Last mammogram: 02/12/2020. Results were: normal  Obstetric History OB History  Gravida Para Term Preterm AB Living  0 0 0 0 0 0  SAB IAB Ectopic Multiple Live Births  0 0 0 0 0    Past Medical History:  Diagnosis Date  . Abnormal Pap smear 09/26/10   ASCUS  . Abnormal uterine bleeding   . Asthma    Albuterol Inhaler last used 7/12  . Diabetes (HCC)   . Diabetes mellitus without complication (HCC)   . Hypercholesteremia   . Morbid obesity (HCC)   . Simple endometrial hyperplasia without atypia     Past Surgical History:  Procedure Laterality Date  . APPENDECTOMY    . CYSTECTOMY  1989  . ENDOMETRIAL BIOPSY  02/13/11   AUB  . HYSTEROSCOPY WITH D & C  04/16/2011   Procedure: DILATATION AND CURETTAGE (D&C) /HYSTEROSCOPY;  Surgeon: Tereso Newcomer, MD;  Location: [facility];  Service: Gynecology;  Laterality: N/A;  Diagnostic  . polyp removal  2010    Current Outpatient Medications on File Prior to Visit  Medication Sig Dispense Refill  . atorvastatin (LIPITOR) 80 MG tablet Take 1 tablet (80 mg total) by mouth daily. 90 tablet 0  . glipiZIDE (GLUCOTROL) 10 MG tablet Take 1 tablet (10 mg total) by mouth 2 (two) times daily before a meal. 180 tablet 0  . lisinopril (ZESTRIL) 2.5 MG tablet Take 1 tablet by mouth once daily 90 tablet 0  . metFORMIN (GLUCOPHAGE) 1000 MG tablet Take 1 tablet (1,000 mg total) by mouth 2 (two) times daily with a meal. 180 tablet 0  . Multiple Vitamin (MULTIVITAMIN WITH MINERALS) TABS tablet Take 1 tablet by  mouth daily.    . fluconazole (DIFLUCAN) 150 MG tablet Take 1 tablet repeat in 3 days for yeast infection (Patient not taking: Reported on 01/10/2021) 2 tablet 0  . meloxicam (MOBIC) 15 MG tablet Take 1 tablet (15 mg total) by mouth daily. For inflammation in thumb (Patient not taking: Reported on 08/29/2020) 14 tablet 0  . Multiple Vitamins-Minerals (AIRBORNE PO) Take by mouth.    . Semaglutide,0.25 or 0.5MG /DOS, (OZEMPIC, 0.25 OR 0.5 MG/DOSE,) 2 MG/1.5ML SOPN Inject 0.25mg  once a week (Patient not taking: Reported on 01/10/2021) 1.5 mL 3   No current facility-administered medications on file prior to visit.    Allergies  Allergen Reactions  . Farxiga [Dapagliflozin] Itching    Social History:  reports that she has been smoking cigarettes. She has a 14.00 pack-year smoking history. She has never used smokeless tobacco. She reports current alcohol use. She reports that she does not use drugs.  Family History  Problem Relation Age of Onset  . Diabetes Mother   . Heart disease Mother   . Hyperlipidemia Mother   . Diabetes Maternal Grandmother   . Diabetes Maternal Grandfather   . Cancer Paternal Grandmother   . Breast cancer Paternal Grandmother     The following portions of the patient's history were reviewed and updated as appropriate: allergies, current medications, past family history, past medical history, past social history, past  surgical history and problem list.  Review of Systems Pertinent items noted in HPI and remainder of comprehensive ROS otherwise negative.  Physical Exam:  BP 136/82   Pulse 94   Ht 5\' 8"  (1.727 m)   Wt 261 lb (118.4 kg)   BMI 39.68 kg/m  CONSTITUTIONAL: Well-developed, well-nourished female in no acute distress.  HENT:  Normocephalic, atraumatic, External right and left ear normal.  EYES: Conjunctivae and EOM are normal. Pupils are equal, round, and reactive to light. No scleral icterus.  NECK: Normal range of motion, supple, no masses.  Normal  thyroid.  SKIN: Skin is warm and dry. No rash noted. Not diaphoretic. No erythema. No pallor. MUSCULOSKELETAL: Normal range of motion. No tenderness.  No cyanosis, clubbing, or edema. NEUROLOGIC: Alert and oriented to person, place, and time. Normal reflexes, muscle tone coordination.  PSYCHIATRIC: Normal mood and affect. Normal behavior. Normal judgment and thought content. CARDIOVASCULAR: Normal heart rate noted, regular rhythm BREASTS: Symmetric in size. No masses, tenderness, skin changes, nipple drainage, or lymphadenopathy bilaterally.  Performed in the presence of a chaperone. ABDOMEN: Soft, obese, no distention appreciated.  No tenderness, rebound or guarding.  PELVIC: Normal appearing external genitalia and urethral meatus; normal appearing vaginal mucosa and cervix.  Normal appearing discharge.  Pap smear obtained.  Unable to palpate uterus or adnexa secondary to habitus.  Performed in the presence of a chaperone.   Assessment and Plan:    1. Breast cancer screening by mammogram Mammogram scheduled - MM 3D SCREEN BREAST BILATERAL; Future  2. History of simple endometrial hyperplasia without atypia Diagnosed on endometrial biopsy 02/13/11.  Treated with Provera daily.  Underwent diagnostic hysteroscopy, D&C on 04/16/11; same diagnosis. 2016: Simple hyperplasia without atypia on endometrial biopsy.  Needs yearly biopsy for now. 03/25/17: Proliferative endometrium, no hyperplasia on endometrial biopsy. Continue Megace, reevaluate next year. 06/05/18: Benign endometrium. Continue Megace. 12/03/19: Patient declined biopsy.Talked to Dr. 02/02/20 (GYN Oncology) and she agrees that further endometrial biopsies are not needed for now, as long as there is no abnormal bleeding. However, she has to remain on Megace indefinitely or consider progesterone IUD to prevent recurrence of endometrial hyperplasia. Weight loss can also help in reducing risks of endometrial hyperplasia/cancer by decreasing the  increased estrogen produced by fat cells. Reiterated with patient today. Megace refilled. - megestrol (MEGACE) 40 MG tablet; TAKE 1 TABLET BY MOUTH ONCE DAILY. CAN INCREASE TO 2 TABLETS EVERY DAY IN THE EVENT OF HEAVY BLEEDING  Dispense: 90 tablet; Refill: 4  3. Well woman exam with routine gynecological exam - Cytology - PAP Will follow up results of pap smear and manage accordingly. If negative, can get every three years. Routine preventative health maintenance measures emphasized. Please refer to After Visit Summary for other counseling recommendations.      Adolphus Birchwood, MD, FACOG Obstetrician & Gynecologist, [facility]

## 2021-01-10 NOTE — Patient Instructions (Signed)
Preventive Care 84-44 Years Old, Female Preventive care refers to lifestyle choices and visits with your health care provider that can promote health and wellness. This includes:  A yearly physical exam. This is also called an annual wellness visit.  Regular dental and eye exams.  Immunizations.  Screening for certain conditions.  Healthy lifestyle choices, such as: ? Eating a healthy diet. ? Getting regular exercise. ? Not using drugs or products that contain nicotine and tobacco. ? Limiting alcohol use. What can I expect for my preventive care visit? Physical exam Your health care provider will check your:  Height and weight. These may be used to calculate your BMI (body mass index). BMI is a measurement that tells if you are at a healthy weight.  Heart rate and blood pressure.  Body temperature.  Skin for abnormal spots. Counseling Your health care provider may ask you questions about your:  Past medical problems.  Family's medical history.  Alcohol, tobacco, and drug use.  Emotional well-being.  Home life and relationship well-being.  Sexual activity.  Diet, exercise, and sleep habits.  Work and work Statistician.  Access to firearms.  Method of birth control.  Menstrual cycle.  Pregnancy history. What immunizations do I need? Vaccines are usually given at various ages, according to a schedule. Your health care provider will recommend vaccines for you based on your age, medical history, and lifestyle or other factors, such as travel or where you work.   What tests do I need? Blood tests  Lipid and cholesterol levels. These may be checked every 5 years, or more often if you are over 3 years old.  Hepatitis C test.  Hepatitis B test. Screening  Lung cancer screening. You may have this screening every year starting at age 73 if you have a 30-pack-year history of smoking and currently smoke or have quit within the past 15 years.  Colorectal cancer  screening. ? All adults should have this screening starting at age 52 and continuing until age 17. ? Your health care provider may recommend screening at age 49 if you are at increased risk. ? You will have tests every 1-10 years, depending on your results and the type of screening test.  Diabetes screening. ? This is done by checking your blood sugar (glucose) after you have not eaten for a while (fasting). ? You may have this done every 1-3 years.  Mammogram. ? This may be done every 1-2 years. ? Talk with your health care provider about when you should start having regular mammograms. This may depend on whether you have a family history of breast cancer.  BRCA-related cancer screening. This may be done if you have a family history of breast, ovarian, tubal, or peritoneal cancers.  Pelvic exam and Pap test. ? This may be done every 3 years starting at age 10. ? Starting at age 11, this may be done every 5 years if you have a Pap test in combination with an HPV test. Other tests  STD (sexually transmitted disease) testing, if you are at risk.  Bone density scan. This is done to screen for osteoporosis. You may have this scan if you are at high risk for osteoporosis. Talk with your health care provider about your test results, treatment options, and if necessary, the need for more tests. Follow these instructions at home: Eating and drinking  Eat a diet that includes fresh fruits and vegetables, whole grains, lean protein, and low-fat dairy products.  Take vitamin and mineral supplements  as recommended by your health care provider.  Do not drink alcohol if: ? Your health care provider tells you not to drink. ? You are pregnant, may be pregnant, or are planning to become pregnant.  If you drink alcohol: ? Limit how much you have to 0-1 drink a day. ? Be aware of how much alcohol is in your drink. In the [place] one drink equals one 12 oz bottle of beer (355 mL), one 5 oz glass of  wine (148 mL), or one 1 oz glass of hard liquor (44 mL).   Lifestyle  Take daily care of your teeth and gums. Brush your teeth every morning and night with fluoride toothpaste. Floss one time each day.  Stay active. Exercise for at least 30 minutes 5 or more days each week.  Do not use any products that contain nicotine or tobacco, such as cigarettes, e-cigarettes, and chewing tobacco. If you need help quitting, ask your health care provider.  Do not use drugs.  If you are sexually active, practice safe sex. Use a condom or other form of protection to prevent STIs (sexually transmitted infections).  If you do not wish to become pregnant, use a form of birth control. If you plan to become pregnant, see your health care provider for a prepregnancy visit.  If told by your health care provider, take low-dose aspirin daily starting at age 50.  Find healthy ways to cope with stress, such as: ? Meditation, yoga, or listening to music. ? Journaling. ? Talking to a trusted person. ? Spending time with friends and family. Safety  Always wear your seat belt while driving or riding in a vehicle.  Do not drive: ? If you have been drinking alcohol. Do not ride with someone who has been drinking. ? When you are tired or distracted. ? While texting.  Wear a helmet and other protective equipment during sports activities.  If you have firearms in your house, make sure you follow all gun safety procedures. What's next?  Visit your health care provider once a year for an annual wellness visit.  Ask your health care provider how often you should have your eyes and teeth checked.  Stay up to date on all vaccines. This information is not intended to replace advice given to you by your health care provider. Make sure you discuss any questions you have with your health care provider. Document Revised: 05/24/2020 Document Reviewed: 05/01/2018 Elsevier Patient Education  2021 Elsevier Inc.  

## 2021-01-13 LAB — CYTOLOGY - PAP
Comment: NEGATIVE
Diagnosis: NEGATIVE
High risk HPV: NEGATIVE

## 2021-03-10 ENCOUNTER — Ambulatory Visit
Admission: RE | Admit: 2021-03-10 | Discharge: 2021-03-10 | Disposition: A | Discharge status: Home / Self Care | Payer: BC Managed Care – PPO | Source: Ambulatory Visit | Attending: Obstetrics & Gynecology | Admitting: Obstetrics & Gynecology

## 2021-03-10 ENCOUNTER — Other Ambulatory Visit: Payer: Self-pay

## 2021-03-10 DIAGNOSIS — Z1231 Encounter for screening mammogram for malignant neoplasm of breast: Secondary | ICD-10-CM

## 2021-03-14 ENCOUNTER — Other Ambulatory Visit: Payer: Self-pay | Admitting: Family Medicine

## 2021-03-14 MED ORDER — METFORMIN HCL 1000 MG PO TABS: 1000.0000 mg | ORAL_TABLET | Freq: Two times a day (BID) | ORAL | 0 refills | Status: DC

## 2021-03-14 MED ORDER — GLIPIZIDE 10 MG PO TABS: 10.0000 mg | ORAL_TABLET | Freq: Two times a day (BID) | ORAL | 0 refills | Status: DC

## 2021-03-14 MED ORDER — LISINOPRIL 2.5 MG PO TABS
ORAL_TABLET | ORAL | 0 refills | Status: DC
Start: 2021-03-14 — End: 2021-07-12

## 2021-03-14 MED ORDER — ATORVASTATIN CALCIUM 80 MG PO TABS: 80.0000 mg | ORAL_TABLET | Freq: Every day | ORAL | 0 refills | Status: DC

## 2021-04-16 ENCOUNTER — Ambulatory Visit
Admission: EM | Admit: 2021-04-16 | Discharge: 2021-04-16 | Disposition: A | Discharge status: Home / Self Care | Payer: BC Managed Care – PPO | Attending: Family Medicine | Admitting: Family Medicine

## 2021-04-16 ENCOUNTER — Encounter: Payer: Self-pay | Admitting: Emergency Medicine

## 2021-04-16 DIAGNOSIS — J209 Acute bronchitis, unspecified: Secondary | ICD-10-CM

## 2021-04-16 DIAGNOSIS — U071 COVID-19: Secondary | ICD-10-CM

## 2021-04-16 DIAGNOSIS — Z1152 Encounter for screening for COVID-19: Secondary | ICD-10-CM

## 2021-04-16 MED ORDER — PREDNISONE 10 MG PO TABS: 10.0000 mg | ORAL_TABLET | Freq: Every day | ORAL | 0 refills | Status: AC

## 2021-04-16 MED ORDER — NIRMATRELVIR/RITONAVIR (PAXLOVID)TABLET: 3.0000 | ORAL_TABLET | Freq: Two times a day (BID) | ORAL | 0 refills | Status: AC

## 2021-04-16 MED ORDER — ALBUTEROL SULFATE HFA 108 (90 BASE) MCG/ACT IN AERS
2.0000 | INHALATION_SPRAY | Freq: Once | RESPIRATORY_TRACT | Status: AC
  Administered 2021-04-16: 2 via RESPIRATORY_TRACT

## 2021-04-16 MED ORDER — PROMETHAZINE-DM 6.25-15 MG/5ML PO SYRP: 5.0000 mL | ORAL_SOLUTION | Freq: Four times a day (QID) | ORAL | 0 refills | Status: DC | PRN

## 2021-04-16 NOTE — Discharge Instructions (Addendum)
Confirmatory COVID test will result in 2 to 4 days. In the meantime start medications as prescribed. Start albuterol inhaler 2 puffs every 4-6 hours as needed for shortness of breath, chest tightness or wheezing. If you develop any severe chest pain or shortness of breath go immediately to the emergency department. You are cleared to return to work on 04/20/21

## 2021-04-16 NOTE — ED Triage Notes (Addendum)
Cough and runny nose x 3 days.  Had positive home covid wants pcr test as well today

## 2021-04-16 NOTE — ED Provider Notes (Signed)
[facility]    CSN: [id number] Arrival date & time: 04/16/21  0859      History   Chief Complaint Chief Complaint  Patient presents with   Cough    HPI Mariah [name] is a 45 y.o. female.   HPI Patient presents with URI symptoms including cough, nasal congestion, runny nose x 3 days.  She took a home COVID test which was positive today however requested a confirmative PCR test.  Patient has high risk conditions associated with complications of COVID including hyperlipidemia, type 2 diabetes and obesity along with tobacco usage.   Denies worrisome symptoms of shortness of breath, weakness, N&V, or chest pain. Past Medical History:  Diagnosis Date   Abnormal Pap smear 09/26/10   ASCUS   Abnormal uterine bleeding    Asthma    Albuterol Inhaler last used 7/12   Diabetes (HCC)    Diabetes mellitus without complication (HCC)    Hypercholesteremia    Morbid obesity (HCC)    Simple endometrial hyperplasia without atypia     Patient Active Problem List   Diagnosis Date Noted   Hyperlipidemia 06/03/2017   DM (diabetes mellitus), type 2, uncontrolled (HCC) 06/03/2017   Class 3 obesity 06/03/2017   Tobacco use disorder 06/03/2017   History of simple endometrial hyperplasia without atypia 03/15/2011    Past Surgical History:  Procedure Laterality Date   APPENDECTOMY     CYSTECTOMY  1989   ENDOMETRIAL BIOPSY  02/13/11   AUB   HYSTEROSCOPY WITH D & C  04/16/2011   Procedure: DILATATION AND CURETTAGE (D&C) /HYSTEROSCOPY;  Surgeon: Tereso Newcomer, MD;  Location: [facility];  Service: Gynecology;  Laterality: N/A;  Diagnostic   polyp removal  2010    OB History     Gravida  0   Para  0   Term  0   Preterm  0   AB  0   Living  0      SAB  0   IAB  0   Ectopic  0   Multiple  0   Live Births  0            Home Medications    Prior to Admission medications   Medication Sig Start Date End Date Taking? Authorizing Provider   nirmatrelvir/ritonavir EUA (PAXLOVID) TABS Take 3 tablets by mouth 2 (two) times daily for 5 days. Patient GFR is > 60 (normal). Take nirmatrelvir (150 mg) two tablets twice daily for 5 days and ritonavir (100 mg) one tablet twice daily for 5 days. 04/16/21 04/21/21 Yes Bing Neighbors, FNP  predniSONE (DELTASONE) 10 MG tablet Take 1 tablet (10 mg total) by mouth daily with breakfast for 3 days. 04/16/21 04/19/21 Yes Bing Neighbors, FNP  promethazine-dextromethorphan (PROMETHAZINE-DM) 6.25-15 MG/5ML syrup Take 5 mLs by mouth 4 (four) times daily as needed for cough. 04/16/21  Yes Bing Neighbors, FNP  atorvastatin (LIPITOR) 80 MG tablet Take 1 tablet (80 mg total) by mouth daily. 03/14/21   Donita Brooks, MD  fluconazole (DIFLUCAN) 150 MG tablet Take 1 tablet repeat in 3 days for yeast infection Patient not taking: Reported on 01/10/2021 08/29/20   Salley Scarlet, MD  glipiZIDE (GLUCOTROL) 10 MG tablet Take 1 tablet (10 mg total) by mouth 2 (two) times daily before a meal. 03/14/21   Donita Brooks, MD  lisinopril (ZESTRIL) 2.5 MG tablet Take 1 tablet by mouth once daily 03/14/21   Donita Brooks, MD  megestrol (MEGACE) 40 MG tablet TAKE 1 TABLET BY MOUTH ONCE DAILY. CAN INCREASE TO 2 TABLETS EVERY DAY IN THE EVENT OF HEAVY BLEEDING 01/10/21   Anyanwu, Jethro Bastos, MD  meloxicam (MOBIC) 15 MG tablet Take 1 tablet (15 mg total) by mouth daily. For inflammation in thumb Patient not taking: Reported on 08/29/2020 05/02/20   Salley Scarlet, MD  metFORMIN (GLUCOPHAGE) 1000 MG tablet Take 1 tablet (1,000 mg total) by mouth 2 (two) times daily with a meal. 03/14/21   Donita Brooks, MD  Multiple Vitamin (MULTIVITAMIN WITH MINERALS) TABS tablet Take 1 tablet by mouth daily.    [provider]  Multiple Vitamins-Minerals (AIRBORNE PO) Take by mouth.    [provider]  Semaglutide,0.25 or 0.5MG /DOS, (OZEMPIC, 0.25 OR 0.5 MG/DOSE,) 2 MG/1.5ML SOPN Inject 0.25mg  once a  week Patient not taking: Reported on 01/10/2021 09/06/20   Salley Scarlet, MD    Family History Family History  Problem Relation Age of Onset   Diabetes Mother    Heart disease Mother    Hyperlipidemia Mother    Diabetes Maternal Grandmother    Diabetes Maternal Grandfather    Cancer Paternal Grandmother    Breast cancer Paternal Grandmother     Social History Social History   Tobacco Use   Smoking status: Every Day    Packs/day: 1.00    Years: 14.00    Pack years: 14.00    Types: Cigarettes   Smokeless tobacco: Never  Substance Use Topics   Alcohol use: Yes    Comment: occassonially   Drug use: No     Allergies   Farxiga [dapagliflozin]   Review of Systems Review of Systems Pertinent negatives listed in HPI   Physical Exam Triage Vital Signs ED Triage Vitals  Enc Vitals Group     BP 04/16/21 0950 110/75     Pulse Rate 04/16/21 0950 (!) 112     Resp 04/16/21 0950 17     Temp 04/16/21 0950 99.3 F (37.4 C)     Temp Source 04/16/21 0950 Oral     SpO2 04/16/21 0950 96 %     Weight --      Height --      Head Circumference --      Peak Flow --      Pain Score 04/16/21 1010 0     Pain Loc --      Pain Edu? --      Excl. in GC? --    No data found.  Updated Vital Signs BP 110/75 (BP Location: Right Arm)   Pulse (!) 112   Temp 99.3 F (37.4 C) (Oral)   Resp 17   SpO2 96%   Visual Acuity Right Eye Distance:   Left Eye Distance:   Bilateral Distance:    Right Eye Near:   Left Eye Near:    Bilateral Near:     Physical Exam General appearance: Alert, Ill-appearing, no distress Head: Normocephalic, without obvious abnormality, atraumatic ENT: Normocephalic, nares with  mucosal edema w/ rhinorrhea ,  oropharynx w/o exudate Respiratory: Respirations even , unlabored, coarse lung sound, expiratory wheeze Heart: Rate and rhythm normal. No gallop or murmurs noted on exam  Extremities: No gross deformities Skin: Skin color, texture, turgor normal.  No rashes seen  Psych: Appropriate mood and affect. Neurologic: GCS 15, normal coordination, normal gait  Mental status: Alert, oriented to person, place, and time, thought content appropriate.  UC Treatments / Results  Labs (all labs  ordered are listed, but only abnormal results are displayed) Labs Reviewed  NOVEL CORONAVIRUS, NAA    EKG   Radiology No results found.  Procedures Procedures (including critical care time)  Medications Ordered in UC Medications  albuterol (VENTOLIN HFA) 108 (90 Base) MCG/ACT inhaler 2 puff (has no administration in time range)    Initial Impression / Assessment and Plan / UC Course  I have reviewed the triage vital signs and the nursing notes.  Pertinent labs & imaging results that were available during my care of the patient were reviewed by me and considered in my medical decision making (see chart for details).    COVID-19 virus infection positive with home test.  Patient requested confirmatory PCR test.  Treatment per discharge instructions.  Patient is a smoker and is suffering with acute bronchitis secondary to COVID virus.  Will place on very low-dose of prednisone for 3 days to help improve bronchial inflammation.  Patient advised to use albuterol inhaler 2 puffs every 4-6 hours as needed.  Return to work note provided.  ER precautions given.  Final Clinical Impressions(s) / UC Diagnoses   Final diagnoses:  COVID-19 virus infection  Encounter for screening for COVID-19  Acute bronchitis, unspecified organism     Discharge Instructions      Confirmatory COVID test will result in 2 to 4 days. In the meantime start medications as prescribed. Start albuterol inhaler 2 puffs every 4-6 hours as needed for shortness of breath, chest tightness or wheezing. If you develop any severe chest pain or shortness of breath go immediately to the emergency department. You are cleared to return to work on 04/20/21     ED Prescriptions      Medication Sig Dispense Auth. Provider   nirmatrelvir/ritonavir EUA (PAXLOVID) TABS Take 3 tablets by mouth 2 (two) times daily for 5 days. Patient GFR is > 60 (normal). Take nirmatrelvir (150 mg) two tablets twice daily for 5 days and ritonavir (100 mg) one tablet twice daily for 5 days. 30 tablet Bing Neighbors, FNP   predniSONE (DELTASONE) 10 MG tablet Take 1 tablet (10 mg total) by mouth daily with breakfast for 3 days. 3 tablet Bing Neighbors, FNP   promethazine-dextromethorphan (PROMETHAZINE-DM) 6.25-15 MG/5ML syrup Take 5 mLs by mouth 4 (four) times daily as needed for cough. 140 mL Bing Neighbors, FNP      PDMP not reviewed this encounter.   Bing Neighbors, FNP 04/16/21 1100

## 2021-04-17 LAB — SARS-COV-2, NAA 2 DAY TAT

## 2021-04-17 LAB — NOVEL CORONAVIRUS, NAA: SARS-CoV-2, NAA: DETECTED — AB

## 2021-05-19 ENCOUNTER — Ambulatory Visit: Payer: BC Managed Care – PPO | Attending: Internal Medicine | Admitting: Internal Medicine

## 2021-05-19 ENCOUNTER — Encounter: Payer: Self-pay | Admitting: Internal Medicine

## 2021-05-19 ENCOUNTER — Other Ambulatory Visit: Payer: Self-pay

## 2021-05-19 VITALS — BP 132/79 | HR 94 | Resp 16 | Ht 68.0 in | Wt 253.4 lb

## 2021-05-19 DIAGNOSIS — E1165 Type 2 diabetes mellitus with hyperglycemia: Secondary | ICD-10-CM

## 2021-05-19 DIAGNOSIS — E1169 Type 2 diabetes mellitus with other specified complication: Secondary | ICD-10-CM

## 2021-05-19 DIAGNOSIS — Z2821 Immunization not carried out because of patient refusal: Secondary | ICD-10-CM

## 2021-05-19 DIAGNOSIS — Z6838 Body mass index (BMI) 38.0-38.9, adult: Secondary | ICD-10-CM

## 2021-05-19 DIAGNOSIS — E785 Hyperlipidemia, unspecified: Secondary | ICD-10-CM

## 2021-05-19 DIAGNOSIS — F17200 Nicotine dependence, unspecified, uncomplicated: Secondary | ICD-10-CM

## 2021-05-19 DIAGNOSIS — F17210 Nicotine dependence, cigarettes, uncomplicated: Secondary | ICD-10-CM

## 2021-05-19 DIAGNOSIS — Z7689 Persons encountering health services in other specified circumstances: Secondary | ICD-10-CM

## 2021-05-19 DIAGNOSIS — R030 Elevated blood-pressure reading, without diagnosis of hypertension: Secondary | ICD-10-CM

## 2021-05-19 DIAGNOSIS — E669 Obesity, unspecified: Secondary | ICD-10-CM

## 2021-05-19 LAB — POCT GLYCOSYLATED HEMOGLOBIN (HGB A1C): HbA1c, POC (controlled diabetic range): 11.7 % — AB (ref 0.0–7.0)

## 2021-05-19 LAB — GLUCOSE, POCT (MANUAL RESULT ENTRY): POC Glucose: 225 mg/dl — AB (ref 70–99)

## 2021-05-19 MED ORDER — TRULICITY 0.75 MG/0.5ML ~~LOC~~ SOAJ: 0.7500 mg | SUBCUTANEOUS | 1 refills | Status: DC

## 2021-05-19 MED ORDER — NICOTINE 21 MG/24HR TD PT24: 21.0000 mg | MEDICATED_PATCH | Freq: Every day | TRANSDERMAL | 1 refills | Status: DC

## 2021-05-19 NOTE — Patient Instructions (Addendum)
Please check your blood sugars at least twice a day before breakfast and before dinner.  Bring in your readings with you when you come to see the clinical pharmacist in 3 weeks.  The goal for your blood sugars before meals is 90-130.  Healthy Eating Following a healthy eating pattern may help you to achieve and maintain a healthy body weight, reduce the risk of chronic disease, and live a long and productive life. It is important to follow a healthy eating pattern at an appropriate calorie level for your body. Your nutritional needs should be met primarily through food by choosing a variety of nutrient-rich foods. What are tips for following this plan? Reading food labels Read labels and choose the following: Reduced or low sodium. Juices with 100% fruit juice. Foods with low saturated fats and high polyunsaturated and monounsaturated fats. Foods with whole grains, such as whole wheat, cracked wheat, brown rice, and wild rice. Whole grains that are fortified with folic acid. This is recommended for women who are pregnant or who want to become pregnant. Read labels and avoid the following: Foods with a lot of added sugars. These include foods that contain brown sugar, corn sweetener, corn syrup, dextrose, fructose, glucose, high-fructose corn syrup, honey, invert sugar, lactose, malt syrup, maltose, molasses, raw sugar, sucrose, trehalose, or turbinado sugar. Do not eat more than the following amounts of added sugar per day: 6 teaspoons (25 g) for women. 9 teaspoons (38 g) for men. Foods that contain processed or refined starches and grains. Refined grain products, such as white flour, degermed cornmeal, white bread, and white rice. Shopping Choose nutrient-rich snacks, such as vegetables, whole fruits, and nuts. Avoid high-calorie and high-sugar snacks, such as potato chips, fruit snacks, and candy. Use oil-based dressings and spreads on foods instead of solid fats such as butter, stick  margarine, or cream cheese. Limit pre-made sauces, mixes, and "instant" products such as flavored rice, instant noodles, and ready-made pasta. Try more plant-protein sources, such as tofu, tempeh, black beans, edamame, lentils, nuts, and seeds. Explore eating plans such as the Mediterranean diet or vegetarian diet. Cooking Use oil to saut or stir-fry foods instead of solid fats such as butter, stick margarine, or lard. Try baking, boiling, grilling, or broiling instead of frying. Remove the fatty part of meats before cooking. Steam vegetables in water or broth. Meal planning  At meals, imagine dividing your plate into fourths: One-half of your plate is fruits and vegetables. One-fourth of your plate is whole grains. One-fourth of your plate is protein, especially lean meats, poultry, eggs, tofu, beans, or nuts. Include low-fat dairy as part of your daily diet. Lifestyle Choose healthy options in all settings, including home, work, school, restaurants, or stores. Prepare your food safely: Wash your hands after handling raw meats. Keep food preparation surfaces clean by regularly washing with hot, soapy water. Keep raw meats separate from ready-to-eat foods, such as fruits and vegetables. Cook seafood, meat, poultry, and eggs to the recommended internal temperature. Store foods at safe temperatures. In general: Keep cold foods at 66F (4.4C) or below. Keep hot foods at 166F (60C) or above. Keep your freezer at [facility] (-17.8C) or below. Foods are no longer safe to eat when they have been between the temperatures of 40-166F (4.4-60C) for more than 2 hours. What foods should I eat? Fruits Aim to eat 2 cup-equivalents of fresh, canned (in natural juice), or frozen fruits each day. Examples of 1 cup-equivalent of fruit include 1 small apple, 8 large  strawberries, 1 cup canned fruit,  cup dried fruit, or 1 cup 100% juice. Vegetables Aim to eat 2-3 cup-equivalents of fresh and frozen  vegetables each day, including different varieties and colors. Examples of 1 cup-equivalent of vegetables include 2 medium carrots, 2 cups raw, leafy greens, 1 cup chopped vegetable (raw or cooked), or 1 medium baked potato. Grains Aim to eat 6 ounce-equivalents of whole grains each day. Examples of 1 ounce-equivalent of grains include 1 slice of bread, 1 cup ready-to-eat cereal, 3 cups popcorn, or  cup cooked rice, pasta, or cereal. Meats and other proteins Aim to eat 5-6 ounce-equivalents of protein each day. Examples of 1 ounce-equivalent of protein include 1 egg, 1/2 cup nuts or seeds, or 1 tablespoon (16 g) peanut butter. A cut of meat or fish that is the size of a deck of cards is about 3-4 ounce-equivalents. Of the protein you eat each week, try to have at least 8 ounces come from seafood. This includes salmon, trout, herring, and anchovies. Dairy Aim to eat 3 cup-equivalents of fat-free or low-fat dairy each day. Examples of 1 cup-equivalent of dairy include 1 cup (240 mL) milk, 8 ounces (250 g) yogurt, 1 ounces (44 g) natural cheese, or 1 cup (240 mL) fortified soy milk. Fats and oils Aim for about 5 teaspoons (21 g) per day. Choose monounsaturated fats, such as canola and olive oils, avocados, peanut butter, and most nuts, or polyunsaturated fats, such as sunflower, corn, and soybean oils, walnuts, pine nuts, sesame seeds, sunflower seeds, and flaxseed. Beverages Aim for six 8-oz glasses of water per day. Limit coffee to three to five 8-oz cups per day. Limit caffeinated beverages that have added calories, such as soda and energy drinks. Limit alcohol intake to no more than 1 drink a day for nonpregnant women and 2 drinks a day for men. One drink equals 12 oz of beer (355 mL), 5 oz of wine (148 mL), or 1 oz of hard liquor (44 mL). Seasoning and other foods Avoid adding excess amounts of salt to your foods. Try flavoring foods with herbs and spices instead of salt. Avoid adding sugar to  foods. Try using oil-based dressings, sauces, and spreads instead of solid fats. This information is based on general [place] nutrition guidelines. For more information, visit [employer]. Exact amounts may vary based on your nutrition needs. Summary A healthy eating plan may help you to maintain a healthy weight, reduce the risk of chronic diseases, and stay active throughout your life. Plan your meals. Make sure you eat the right portions of a variety of nutrient-rich foods. Try baking, boiling, grilling, or broiling instead of frying. Choose healthy options in all settings, including home, work, school, restaurants, or stores. This information is not intended to replace advice given to you by your health care provider. Make sure you discuss any questions you have with your health care provider. Document Revised: 12/02/2017 Document Reviewed: 12/02/2017 Elsevier Patient Education  [place].

## 2021-05-19 NOTE — Progress Notes (Signed)
  Patient ID: Mariah [name], female    DOB: 09/20/1975  MRN: [id number]  CC: New patient visit.  Subjective: Boyd Hixon is a 45 y.o. female who presents for new patient visit Her concerns today include:  Patient with history of DM type II, HL, obesity, tobacco dependence, endometrial hyperplasia  Former PCP was Dr. [place].  Decided to change because Dr. [place] left the practice.   DM/Obesity: Results for orders placed or performed in visit on 05/19/21  POCT glucose (manual entry)  Result Value Ref Range   POC Glucose 225 (A) 70 - 99 mg/dl  POCT glycosylated hemoglobin (Hb A1C)  Result Value Ref Range   Hemoglobin A1C     HbA1c POC (<> result, manual entry)     HbA1c, POC (prediabetic range)     HbA1c, POC (controlled diabetic range) 11.7 (A) 0.0 - 7.0 %   Patient currently on  metformin and glipizide. Rxn Ozempic on last visit with previous PCP but could not afford. Not checking BS regarding Endorses polyuria.  No blurred vision or numbness in extremities On low-dose lisinopril for renal protection.  Last urine microalbumin 08/2020 was negative.  Compliant with medications. Reports not doing well with eating habits. Feels she has to do more meal planning. Eats out more than she wants too. Does a lot of walking at work at [employer].  -wgh down 8 lbs since 01/2021 -had eye exam at [facility] early this yr.  Will send me the exact date via MyChart  HL: Taking and tolerating Lipitor.  Tobacco dependence: She has smoked since early 20s.  Smokes almost a pk/day.  Quit 4 yrs ago for about 1 mth due to acute bronchitis issue at the time.  Wants to quit "because I know it is not good for me."   HM: Due for flu shot.  She declines  Patient Active Problem List   Diagnosis Date Noted   Hyperlipidemia 06/03/2017   DM (diabetes mellitus), type 2, uncontrolled (HCC) 06/03/2017   Class 3 obesity 06/03/2017   Tobacco use disorder 06/03/2017   History of simple endometrial  hyperplasia without atypia 03/15/2011     Current Outpatient Medications on File Prior to Visit  Medication Sig Dispense Refill   atorvastatin (LIPITOR) 80 MG tablet Take 1 tablet (80 mg total) by mouth daily. 90 tablet 0   glipiZIDE (GLUCOTROL) 10 MG tablet Take 1 tablet (10 mg total) by mouth 2 (two) times daily before a meal. 180 tablet 0   lisinopril (ZESTRIL) 2.5 MG tablet Take 1 tablet by mouth once daily 90 tablet 0   megestrol (MEGACE) 40 MG tablet TAKE 1 TABLET BY MOUTH ONCE DAILY. CAN INCREASE TO 2 TABLETS EVERY DAY IN THE EVENT OF HEAVY BLEEDING 90 tablet 4   metFORMIN (GLUCOPHAGE) 1000 MG tablet Take 1 tablet (1,000 mg total) by mouth 2 (two) times daily with a meal. 180 tablet 0   Multiple Vitamin (MULTIVITAMIN WITH MINERALS) TABS tablet Take 1 tablet by mouth daily.     Multiple Vitamins-Minerals (AIRBORNE PO) Take by mouth.     promethazine-dextromethorphan (PROMETHAZINE-DM) 6.25-15 MG/5ML syrup Take 5 mLs by mouth 4 (four) times daily as needed for cough. 140 mL 0   No current facility-administered medications on file prior to visit.    Allergies  Allergen Reactions   Farxiga [Dapagliflozin] Itching    Social History   Socioeconomic History   Marital status: Single    Spouse name: Not on file   Number   of children: Not on file   Years of education: Not on file   Highest education level: Not on file  Occupational History   Not on file  Tobacco Use   Smoking status: Every Day    Packs/day: 1.00    Years: 14.00    Pack years: 14.00    Types: Cigarettes   Smokeless tobacco: Never  Vaping Use   Vaping Use: Never used  Substance and Sexual Activity   Alcohol use: Yes    Comment: occassonially   Drug use: No   Sexual activity: Not Currently  Other Topics Concern   Not on file  Social History Narrative   Not on file   Social Determinants of Health   Financial Resource Strain: Not on file  Food Insecurity: Not on file  Transportation Needs: Not on file   Physical Activity: Not on file  Stress: Not on file  Social Connections: Not on file  Intimate Partner Violence: Not on file    Family History  Problem Relation Age of Onset   Diabetes Mother    Heart disease Mother    Hyperlipidemia Mother    Diabetes Maternal Grandmother    Diabetes Maternal Grandfather    Cancer Paternal Grandmother    Breast cancer Paternal Grandmother     Past Surgical History:  Procedure Laterality Date   APPENDECTOMY     CYSTECTOMY  1989   ENDOMETRIAL BIOPSY  02/13/11   AUB   HYSTEROSCOPY WITH D & C  04/16/2011   Procedure: DILATATION AND CURETTAGE (D&C) /HYSTEROSCOPY;  Surgeon: Ugonna A Anyanwu, MD;  Location: [facility];  Service: Gynecology;  Laterality: N/A;  Diagnostic   polyp removal  2010    ROS: Review of Systems Negative except as stated above  PHYSICAL EXAM: BP 132/79   Pulse 94   Resp 16   Ht 5' 8" (1.727 m)   Wt 253 lb 6.4 oz (114.9 kg)   SpO2 99%   BMI 38.53 kg/m   Wt Readings from Last 3 Encounters:  05/19/21 253 lb 6.4 oz (114.9 kg)  01/10/21 261 lb (118.4 kg)  08/29/20 265 lb (120.2 kg)    Physical Exam  General appearance - alert, well appearing, middle-age obese African-American female and in no distress Mental status - normal mood, behavior, speech, dress, motor activity, and thought processes Neck - supple, no significant adenopathy Eyes: Nonicteric sclera.  Pink conjunctiva. Nose: Nasal mucosa dry.  Turbinates appear normal. Mouth: Moist mucosa.  No oral lesions.  Throat clear. Chest - clear to auscultation, no wheezes, rales or rhonchi, symmetric air entry Heart - normal rate, regular rhythm, normal S1, S2, no murmurs, rubs, clicks or gallops Extremities - peripheral pulses normal, no pedal edema, no clubbing or cyanosis Skin - mild hirsutism on the chin Diabetic Foot Exam - Simple   Simple Foot Form Visual Inspection No deformities, no ulcerations, no other skin breakdown bilaterally: Yes Sensation  Testing Intact to touch and monofilament testing bilaterally: Yes Pulse Check Posterior Tibialis and Dorsalis pulse intact bilaterally: Yes Comments      CMP Latest Ref Rng & Units 08/29/2020 05/02/2020 01/01/2020  Glucose 65 - 99 mg/dL 256(H) 151(H) 170(H)  BUN 7 - 25 mg/dL 10 15 13  Creatinine 0.50 - 1.10 mg/dL 0.78 0.69 0.83  Sodium 135 - 146 mmol/L 140 136 138  Potassium 3.5 - 5.3 mmol/L 4.2 4.3 4.6  Chloride 98 - 110 mmol/L 107 105 107  CO2 20 - 32 mmol/L 22 21 21    Calcium 8.6 - 10.2 mg/dL 9.5 9.9 10.0  Total Protein 6.1 - 8.1 g/dL 7.1 7.3 7.2  Total Bilirubin 0.2 - 1.2 mg/dL 0.6 0.5 0.4  AST 10 - 30 U/L 10 7(L) 12  ALT 6 - 29 U/L 13 10 11   Lipid Panel     Component Value Date/Time   CHOL 146 08/29/2020 0840   TRIG 54 08/29/2020 0840   HDL 36 (L) 08/29/2020 0840   CHOLHDL 4.1 08/29/2020 0840   LDLCALC 96 08/29/2020 0840    CBC    Component Value Date/Time   WBC 9.7 08/29/2020 0840   RBC 5.13 (H) 08/29/2020 0840   HGB 13.8 08/29/2020 0840   HCT 41.8 08/29/2020 0840   PLT 360 08/29/2020 0840   MCV 81.5 08/29/2020 0840   MCH 26.9 (L) 08/29/2020 0840   MCHC 33.0 08/29/2020 0840   RDW 12.7 08/29/2020 0840   LYMPHSABS 3,288 08/29/2020 0840   EOSABS 378 08/29/2020 0840   BASOSABS 58 08/29/2020 0840    ASSESSMENT AND PLAN: 1. Establishing care with new doctor, encounter for   2. Type 2 diabetes mellitus with obesity (HCC) Not at goal.  I went over A1c with her. Recommend continue metformin and glipizide.  Recommend starting Trulicity which looks like it is covered by her insurance.  I went over with her how the medication works and possible side effects to include nausea/vomiting and pancreatitis.  Patient advised that if she develops any vomiting or pain in the upper abdomen she should stop the medication and let me know.  He is willing to try the Trulicity.  Met with clinical pharmacist today for teaching on administration. -Encouraged to check blood sugars  before breakfast and before dinner and keep a log.  I will have her follow-up with the clinical pharmacist in 3 weeks with her log. Discussed and encourage healthy eating habits.  Printed information given.  She is agreeable to seeing the nutritionist. Encouraged getting in some form of moderate intensity exercise for total of 150 minutes/week. - POCT glucose (manual entry) - POCT glycosylated hemoglobin (Hb A1C) - Amb ref to Medical Nutrition Therapy-MNT - Comprehensive metabolic panel - Dulaglutide (TRULICITY) 0.75 MG/0.5ML SOPN; Inject 0.75 mg into the skin once a week.  Dispense: 2 mL; Refill: 1  3. Hyperlipidemia associated with type 2 diabetes mellitus (HCC) Continue atorvastatin.  She is on the highest dose.  Last LDL 08/2020 was 96 with goal being less than 70  4. Tobacco dependence Pt is current smoker. Patient advised to quit smoking. Discussed health risks associated with smoking including lung and other types of cancers, chronic lung diseases and CV risks.. Pt ready to give trail of quitting.   Discussed methods to help quit including quitting cold turkey, use of NRT, Chantix and Bupropion.  Pt wanting to try: nicotine patches.  Went over step down method.  Will start with the 21 mg patch and have her use it for 1-2 mths.  Went over possible side effects of the nicotine patches.  Went over how to use the patches. _3_ Minutes spent on counseling. F/U: Reassess progress on subsequent visit.  - nicotine (NICODERM CQ - DOSED IN MG/24 HOURS) 21 mg/24hr patch; Place 1 patch (21 mg total) onto the skin daily.  Dispense: 28 patch; Refill: 1  5. Elevated blood pressure reading without diagnosis of hypertension Blood pressure above goal today.  DASH diet discussed and encouraged.  6. Influenza vaccination declined    Patient was given the opportunity to   ask questions.  Patient verbalized understanding of the plan and was able to repeat key elements of the plan.   Orders Placed This  Encounter  Procedures   Comprehensive metabolic panel   Amb ref to Medical Nutrition Therapy-MNT   POCT glucose (manual entry)   POCT glycosylated hemoglobin (Hb A1C)     Requested Prescriptions   Signed Prescriptions Disp Refills   Dulaglutide (TRULICITY) 0.75 MG/0.5ML SOPN 2 mL 1    Sig: Inject 0.75 mg into the skin once a week.   nicotine (NICODERM CQ - DOSED IN MG/24 HOURS) 21 mg/24hr patch 28 patch 1    Sig: Place 1 patch (21 mg total) onto the skin daily.    Return in about 4 months (around 09/18/2021) for Give appt with Luke in 3 wks for DM recheck.  Deborah Johnson, MD, FACP  

## 2021-05-20 LAB — COMPREHENSIVE METABOLIC PANEL
ALT: 8 IU/L (ref 0–32)
AST: 10 IU/L (ref 0–40)
Albumin/Globulin Ratio: 1.5 (ref 1.2–2.2)
Albumin: 4.4 g/dL (ref 3.8–4.8)
Alkaline Phosphatase: 111 IU/L (ref 44–121)
BUN/Creatinine Ratio: 10 (ref 9–23)
BUN: 8 mg/dL (ref 6–24)
Bilirubin Total: 0.4 mg/dL (ref 0.0–1.2)
CO2: 22 mmol/L (ref 20–29)
Calcium: 10.2 mg/dL (ref 8.7–10.2)
Chloride: 104 mmol/L (ref 96–106)
Creatinine, Ser: 0.8 mg/dL (ref 0.57–1.00)
Globulin, Total: 2.9 g/dL (ref 1.5–4.5)
Glucose: 199 mg/dL — ABNORMAL HIGH (ref 65–99)
Potassium: 4.7 mmol/L (ref 3.5–5.2)
Sodium: 141 mmol/L (ref 134–144)
Total Protein: 7.3 g/dL (ref 6.0–8.5)
eGFR: 93 mL/min/{1.73_m2} (ref >59)

## 2021-06-09 ENCOUNTER — Ambulatory Visit: Payer: BC Managed Care – PPO | Attending: Internal Medicine | Admitting: Pharmacist

## 2021-06-09 ENCOUNTER — Other Ambulatory Visit: Payer: Self-pay

## 2021-06-09 ENCOUNTER — Encounter: Payer: Self-pay | Admitting: Pharmacist

## 2021-06-09 DIAGNOSIS — E1169 Type 2 diabetes mellitus with other specified complication: Secondary | ICD-10-CM | POA: Diagnosis not present

## 2021-06-09 DIAGNOSIS — E669 Obesity, unspecified: Secondary | ICD-10-CM | POA: Diagnosis not present

## 2021-06-09 MED ORDER — TRULICITY 1.5 MG/0.5ML ~~LOC~~ SOAJ: 1.5000 mg | SUBCUTANEOUS | 2 refills | Status: DC

## 2021-06-09 NOTE — Progress Notes (Addendum)
    S:     No chief complaint on file.   Patient arrives in good spirits.  Presents for diabetes evaluation, education, and management. Patient was referred and last seen by Primary Care Provider on 05/19/2021. Trulicity was started at that visit.     Patient reports Diabetes was diagnosed in ~10-12 years ago. She denies any hx of DKA/hospitalization d/t DM. Denies pancreatitis or thyroid cancer hx. Denies any hx of CHF, stroke, ACS, or CKD.   Since Trulicity was started, she endorses frequent bowel movements that have recently improved. Denies any NV, abdominal pain. Does endorse an uncomfrtable feeling in her chest that is not happening currently. She says it happens after injecting her Trulicity. Denies any syncope or presyncope. She tells me this feeling improves and she is not currently feeling it at this time.   Family/Social History:  -Fhx: heart disease, DM, HLD  -Tobacco: current 1 PPD smoker  -Alcohol: none reported   Insurance coverage/medication affordability: BCBS  Medication adherence reported.   Current diabetes medications include: Trulicity 0.75 mg weekly, glipizide 10 mg BID, metformin 1000 mg BID Current hypertension medications include: lisinopril 2.5 mg daily  Current hyperlipidemia medications include: atorvastatin 80 mg daily   Patient denies hypoglycemic events.  Patient reported dietary habits: -Tried to limit carbs. Names rice, bread, and pasta. Admits that rice raises her home CBG. -Tries to limit sweets but admits to preferring these   Patient-reported exercise habits:  -Walks 3-4 miles at work, does aerobics on her days off -Wishes to increase her exercise outside of work but does not currently walk outside of work   Patient denies nocturia (nighttime urination).  Patient denies neuropathy (nerve pain). Patient reports visual changes. Patient reports self foot exams.     O:  Physical Exam  ROS  Lab Results  Component Value Date   HGBA1C 11.7  (A) 05/19/2021   There were no vitals filed for this visit.  Lipid Panel     Component Value Date/Time   CHOL 146 08/29/2020 0840   TRIG 54 08/29/2020 0840   HDL 36 (L) 08/29/2020 0840   CHOLHDL 4.1 08/29/2020 0840   LDLCALC 96 08/29/2020 0840    Home fasting blood sugars: 135-175  2 hour post-meal/random blood sugars: 146-188. 1 outlier of 279.   Clinical Atherosclerotic Cardiovascular Disease (ASCVD): No  The 10-year ASCVD risk score (Arnett DK, et al., 2019) is: 8.8%   Values used to calculate the score:     Age: 45 years     Sex: Female     Is Non-Hispanic African American: Yes     Diabetic: Yes     Tobacco smoker: Yes     Systolic Blood Pressure: 132 mmHg     Is BP treated: No     HDL Cholesterol: 36 mg/dL     Total Cholesterol: 146 mg/dL   A/P: Diabetes longstanding currently uncontrolled. Patient is able to verbalize appropriate hypoglycemia management plan. Medication adherence appears appropriate. -Increased dose of Trulicity to 1.5 mg weekly.  -Continued other medications.  -Extensively discussed pathophysiology of diabetes, recommended lifestyle interventions, dietary effects on blood sugar control -Counseled on s/sx of and management of hypoglycemia -Next A1C anticipated 08/2021.   Written patient instructions provided.  Total time in face to face counseling 30 minutes.   Follow up Pharmacist Clinic Visit in 1 month  Butch Penny, PharmD, [place], CPP Clinical Pharmacist [facility] [phone]

## 2021-06-12 ENCOUNTER — Telehealth: Payer: Self-pay | Admitting: Internal Medicine

## 2021-06-12 NOTE — Telephone Encounter (Signed)
Patient called in asking for  med, trulicity to be switched to mail order instead at,  [facility] - [place], [place] - [address] AT [facility] mail services  [address] Mail Order pharmacy [place] [address]  Phone: [phone] Fax: [phone]

## 2021-06-29 ENCOUNTER — Encounter: Payer: BC Managed Care – PPO | Attending: Internal Medicine | Admitting: Nutrition

## 2021-06-29 VITALS — Ht 67.0 in | Wt 255.0 lb

## 2021-06-29 DIAGNOSIS — F17200 Nicotine dependence, unspecified, uncomplicated: Secondary | ICD-10-CM | POA: Insufficient documentation

## 2021-06-29 DIAGNOSIS — E782 Mixed hyperlipidemia: Secondary | ICD-10-CM | POA: Insufficient documentation

## 2021-06-29 DIAGNOSIS — E1165 Type 2 diabetes mellitus with hyperglycemia: Secondary | ICD-10-CM | POA: Insufficient documentation

## 2021-06-29 NOTE — Progress Notes (Signed)
Time: 1600 End 1630   Assessment:  Primary concerns today: Diabetes Type 2. Dx 3 years. Sees Jonah Blue, at [facility]. Still on  Metformin 1000 mg BID. Glipizide 10 mg BID.   Last A1C 11.7%. Previous A1C 8.9%.  On Trulicity now also. She feels she was eating poorly. Eating better now.  Forgot meter sheets  FBS 140-160's  Hasn't been testing at night at much. Changes made: Trying to eat three 3 meals per day. Trying to eat more meals at home and avoid foods that are fried. She has seen improved her blood sugars when eats better. Wants to start walking and   Works FT in a [employer].  Willing to get back on track and meal prep and exercise more aggressively and consistently.  Elevated LDL. Strong family history of heart disease. She is at high risk for a cardiovascular event. She does smoke also. Stressed need for dietary changes, weight loss, stop smoking and getting back on insulin for needed blood sugar control.   Lab Results  Component Value Date   HGBA1C 11.7 (A) 05/19/2021   CMP Latest Ref Rng & Units 05/19/2021 08/29/2020 05/02/2020  Glucose 65 - 99 mg/dL 518(A) 416(S) 063(K)  BUN 6 - 24 mg/dL 8 10 15   Creatinine 0.57 - 1.00 mg/dL 1.60 1.09  Sodium 134 - 144 mmol/L 141 140 136  Potassium 3.5 - 5.2 mmol/L 4.7 4.2 4.3  Chloride 96 - 106 mmol/L 104 107 105  CO2 20 - 29 mmol/L 22 22 21   Calcium 8.7 - 10.2 mg/dL 3.23 9.5 9.9  Total Protein 6.0 - 8.5 g/dL 7.3 7.1 7.3  Total Bilirubin 0.0 - 1.2 mg/dL 0.4 0.6 0.5  Alkaline Phos 44 - 121 IU/L 111 - -  AST 0 - 40 IU/L 10 10 7(L)  ALT 0 - 32 IU/L 8 13 10    Lipid Panel     Component Value Date/Time   CHOL 146 08/29/2020 0840   TRIG 54 08/29/2020 0840   HDL 36 (L) 08/29/2020 0840   CHOLHDL 4.1 08/29/2020 0840   LDLCALC 96 08/29/2020 0840    Preferred Learning Style:  Visual and Read   Learning Readiness:   Ready Change in progress   MEDICATIONS:    DIETARY INTAKE:  24-hr recall:  B ( AM):   Chicken, mushroom, onions and salad, water OR Eggs, sausage, cheese crissant.  Snk ( AM):  L ( PM): Same as lunch Snk ( PM):  D ( PM):  CHicken, mushrooms, green onions and salad, water Snk ( PM): Beverages: water  Usual physical activity:  Walks some.  Estimated energy needs: 1200  calories 135 g carbohydrates 90 g protein 33 g fat  Progress Towards Goal(s):  In progress.   Nutritional Diagnosis:  NB-1.1 Food and nutrition-related knowledge deficit As related to Diabetes Type 2.  As evidenced by A1C >   1 4%.    Intervention:  Nutrition and Diabetes education provided on My Plate, CHO counting, meal planning, portion sizes, timing of meals, avoiding snacks between meals unless having a low blood sugar, target ranges for A1C and blood sugars, signs/symptoms and treatment of hyper/hypoglycemia, monitoring blood sugars, taking medications as prescribed, benefits of exercising 30 minutes per day and prevention of complications of DM.   Goals  Work on portion control Increase lower carb vegetables Eat 2-3 carb choices per meal Cut out sweets, desserts Eat meals on time Walk on days off or aerobics a few times per week. Get  A1C down 7%  Teaching Method Utilized:  Visual Auditory   Handouts given during visit include: The Plate Method   Barriers to learning/adherence to lifestyle change: none  Demonstrated degree of understanding via:  Teach Back   Monitoring/Evaluation:  Dietary intake, exercise, , and body weight in 3 month(s). Recommend to start long acting insulin to help get blood sugars back down.

## 2021-06-29 NOTE — Patient Instructions (Signed)
Goals  Work on portion control Increase lower carb vegetables Eat 2-3 carb choices per meal Cut out sweets, desserts Eat meals on time Walk on days off or aerobics a few times per week. Get A1C down 7%

## 2021-07-07 ENCOUNTER — Other Ambulatory Visit: Payer: Self-pay | Admitting: Internal Medicine

## 2021-07-07 ENCOUNTER — Other Ambulatory Visit: Payer: Self-pay | Admitting: Family Medicine

## 2021-07-07 NOTE — Telephone Encounter (Signed)
Pt has established new PCP, all refill requests should be routed to this office.

## 2021-07-07 NOTE — Telephone Encounter (Signed)
Pt called in to request a refill for 4 medications.  Pt says that she has requested via her pharmacy but they request are going to her old provider.   atorvastatin (LIPITOR) 80 MG tablet glipiZIDE (GLUCOTROL) 10 MG tablet  metFORMIN (GLUCOPHAGE) 1000 MG tablet  lisinopril (ZESTRIL) 2.5 MG tablet    Last ov: 05/19/21 Future ov: 09/22/21  Pharmacy: [facility] - [place] - [address] AT [facility] mail services  [address] Mail Order pharmacy, [place] [address]  Phone:  [phone]  Fax:  [phone]

## 2021-07-08 NOTE — Telephone Encounter (Signed)
Per note on last refill request. Prescriptions need to be routed to Dr. Lynnea Ferrier at [facility]. Routing to that PCP.

## 2021-07-10 NOTE — Telephone Encounter (Signed)
Not a pt of Dr Cletus Gash. Rerouted to [facility]. Requested Prescriptions  Refused Prescriptions Disp Refills   metFORMIN (GLUCOPHAGE) 1000 MG tablet 180 tablet 0    Sig: Take 1 tablet (1,000 mg total) by mouth 2 (two) times daily with a meal.     Endocrinology:  Diabetes - Biguanides Failed - 07/08/2021  9:35 AM      Failed - HBA1C is between 0 and 7.9 and within 180 days    Hgb A1C (fingerstick)  Date Value Ref Range Status  01/12/2019 >14.0 (H) <6.0 % OF TOTAL HGB Final   HbA1c, POC (controlled diabetic range)  Date Value Ref Range Status  05/19/2021 11.7 (A) 0.0 - 7.0 % Final          Failed - AA eGFR in normal range and within 360 days    eGFR  Date Value Ref Range Status  05/19/2021 93 >59 mL/min/1.73 Final          Passed - Cr in normal range and within 360 days    Creat  Date Value Ref Range Status  08/29/2020 0.78 0.50 - 1.10 mg/dL Final   Creatinine, Ser  Date Value Ref Range Status  05/19/2021 0.80 0.57 - 1.00 mg/dL Final   Creatinine, Urine  Date Value Ref Range Status  08/29/2020 391 (H) 20 - 275 mg/dL Final    Comment:    Verified by repeat analysis. Mariah [name] - Valid encounter within last 6 months    Recent Outpatient Visits           1 month ago Type 2 diabetes mellitus with obesity [facility])   [place], Jarome Matin, RPH-CPP   1 month ago Establishing care with new doctor, encounter for   [place], MD       Future Appointments             In 2 days Tresa Endo, [place]   In 2 months Ladell Pier, MD [place]             lisinopril (ZESTRIL) 2.5 MG tablet 90 tablet 0    Sig: Take 1 tablet by mouth once daily     Cardiovascular:  ACE Inhibitors Passed - 07/08/2021  9:35 AM      Passed - Cr in normal range and within 180 days    Creat  Date Value Ref  Range Status  08/29/2020 0.78 0.50 - 1.10 mg/dL Final   Creatinine, Ser  Date Value Ref Range Status  05/19/2021 0.80 0.57 - 1.00 mg/dL Final   Creatinine, Urine  Date Value Ref Range Status  08/29/2020 391 (H) 20 - 275 mg/dL Final    Comment:    Verified by repeat analysis. .           Passed - K in normal range and within 180 days    Potassium  Date Value Ref Range Status  05/19/2021 4.7 3.5 - 5.2 mmol/L Final          Passed - Patient is not pregnant      Passed - Last BP in normal range    BP Readings from Last 1 Encounters:  05/19/21 132/79          Passed - Valid encounter within last 6 months    Recent Outpatient  Visits           1 month ago Type 2 diabetes mellitus with obesity [facility])   [place], Jarome Matin, RPH-CPP   1 month ago Establishing care with new doctor, encounter for   [place], MD       Future Appointments             In 2 days Daisy Blossom, Jarome Matin, [place]   In 2 months Ladell Pier, MD [place]             glipiZIDE (GLUCOTROL) 10 MG tablet 180 tablet 0    Sig: Take 1 tablet (10 mg total) by mouth 2 (two) times daily before a meal.     Endocrinology:  Diabetes - Sulfonylureas Failed - 07/08/2021  9:35 AM      Failed - HBA1C is between 0 and 7.9 and within 180 days    Hgb A1C (fingerstick)  Date Value Ref Range Status  01/12/2019 >14.0 (H) <6.0 % OF TOTAL HGB Final   HbA1c, POC (controlled diabetic range)  Date Value Ref Range Status  05/19/2021 11.7 (A) 0.0 - 7.0 % Final          Passed - Valid encounter within last 6 months    Recent Outpatient Visits           1 month ago Type 2 diabetes mellitus with obesity [facility])   [place], Jarome Matin, RPH-CPP   1 month ago Establishing care with new doctor,  encounter for   [place], MD       Future Appointments             In 2 days Daisy Blossom, Jarome Matin, [place]   In 2 months Ladell Pier, MD [place]             atorvastatin (LIPITOR) 80 MG tablet 90 tablet 0    Sig: Take 1 tablet (80 mg total) by mouth daily.     Cardiovascular:  Antilipid - Statins Failed - 07/08/2021  9:35 AM      Failed - HDL in normal range and within 360 days    HDL  Date Value Ref Range Status  08/29/2020 36 (L) > OR = 50 mg/dL Final          Passed - Total Cholesterol in normal range and within 360 days    Cholesterol  Date Value Ref Range Status  08/29/2020 146 <200 mg/dL Final          Passed - LDL in normal range and within 360 days    LDL Cholesterol (Calc)  Date Value Ref Range Status  08/29/2020 96 mg/dL (calc) Final    Comment:    Reference range: <100 . Desirable range <100 mg/dL for primary prevention;   <70 mg/dL for patients with CHD or diabetic patients  with > or = 2 CHD risk factors. Marland Kitchen LDL-C is now calculated using the Martin-Hopkins  calculation, which is a validated novel method providing  better accuracy than the Friedewald equation in the  estimation of LDL-C.  Cresenciano Genre et al. Annamaria Helling. 3329;518(84): 2061-2068  (http://education.[employer].com/faq/FAQ164)           Passed - Triglycerides in  normal range and within 360 days    Triglycerides  Date Value Ref Range Status  08/29/2020 54 <150 mg/dL Final          Passed - Patient is not pregnant      Passed - Valid encounter within last 12 months    Recent Outpatient Visits           1 month ago Type 2 diabetes mellitus with obesity [facility])   [place], RPH-CPP   1 month ago Establishing care with new doctor, encounter for   [place], MD       Future Appointments             In 2 days Daisy Blossom, Jarome Matin, [place]   In 2 months Wynetta Emery, Dalbert Batman, MD [place]

## 2021-07-11 NOTE — Progress Notes (Signed)
   PCP: Dr. Laural Benes  S:     Patient arrives in good spirits.  Presents for diabetes evaluation, education, and management. Patient was referred and last seen by Primary Care Provider on 05/19/21. Patient was last seen by pharmacy on 06/09/2021  Family/Social History:  -Family history of diabetes and HLD -Current smoker  Insurance coverage/medication affordability: BCBS  Medication adherence reported .   Current diabetes medications include:  -Trulicity 1.5 mg weekly -Glipizide 10 mg monthly -Metformin 1000 mg BID  Current hypertension medications include:  -Lisinopril 2.5 mg daily  Current hyperlipidemia medications include:  -Atorvastatin 80 mg daily  Patient denies hypoglycemic events.  Patient reported dietary habits: Eats 3 meals/day. Patient sees a nutritionist and reports improved diet with increased vegetable intake and reduction in carb intake.  Patient-reported exercise habits: walk occasionally   Patient denies nocturia (nighttime urination).  Patient denies neuropathy (nerve pain). Patient reports visual changes shortly after starting Trulicity that has been resolved for several weeks. Likely due to quick decline in blood glucose and patient agreeable to continue at this time. Patient reports self foot exams.   O:  Lab Results  Component Value Date   HGBA1C 11.7 (A) 05/19/2021   There were no vitals filed for this visit.  Lipid Panel     Component Value Date/Time   CHOL 146 08/29/2020 0840   TRIG 54 08/29/2020 0840   HDL 36 (L) 08/29/2020 0840   CHOLHDL 4.1 08/29/2020 0840   LDLCALC 96 08/29/2020 0840    Home fasting blood sugars: 131, 146,  137, 129, 106 2 hour post-meal/random blood sugars: 125, 113.  Clinical Atherosclerotic Cardiovascular Disease (ASCVD): No  The 10-year ASCVD risk score (Arnett DK, et al., 2019) is: 8.8%   Values used to calculate the score:     Age: 45 years     Sex: Female     Is Non-Hispanic African American: Yes      Diabetic: Yes     Tobacco smoker: Yes     Systolic Blood Pressure: 132 mmHg     Is BP treated: No     HDL Cholesterol: 36 mg/dL     Total Cholesterol: 146 mg/dL   A/P: Diabetes longstanding currently with better control after Trulicity increase. Patient is able to verbalize appropriate hypoglycemia management plan. Medication adherence appears frequent.  -Continue Trulicity 1.5 mg injection weekly. May consider increasing at next visit pending A1c and tolerability.  -Continue Glipizide 10 mg daily -Continue Metformin 1000 mg twice daily -Extensively discussed pathophysiology of diabetes, recommended lifestyle interventions, dietary effects on blood sugar control -Counseled on s/sx of and management of hypoglycemia -Next A1C, Albumin/Cr ratio, and lipid panel will be collected on 08/16/2021.   ASCVD risk - primary prevention in patient with diabetes. Last LDL is not controlled. ASCVD risk score is not >20%  -  -Continued atorvastatin 80 mg. Will reevaluate need for Zetia with lipid panel next visit.  Hypertension longstanding, not assessed this visit.  Blood pressure goal = <130/80 mmHg. Medication adherence reported.   -Continue Lisinopril 2.5 mg daily  Written patient instructions provided.  Total time in face to face counseling 30 minutes.   Follow up Pharmacist in clinic on 08/16/2021. Follow up with PCP in clinic on 09/22/2021    Thank you for allowing pharmacy to participate in this patient's care.  Enos Fling, PharmD PGY1 Pharmacy Resident 07/12/2021 4:38 PM

## 2021-07-12 ENCOUNTER — Other Ambulatory Visit: Payer: Self-pay

## 2021-07-12 ENCOUNTER — Ambulatory Visit: Payer: BC Managed Care – PPO | Attending: Internal Medicine | Admitting: Pharmacist

## 2021-07-12 DIAGNOSIS — E669 Obesity, unspecified: Secondary | ICD-10-CM

## 2021-07-12 DIAGNOSIS — E1169 Type 2 diabetes mellitus with other specified complication: Secondary | ICD-10-CM

## 2021-07-12 MED ORDER — ATORVASTATIN CALCIUM 80 MG PO TABS: 80.0000 mg | ORAL_TABLET | Freq: Every day | ORAL | 0 refills | Status: DC

## 2021-07-12 MED ORDER — GLIPIZIDE 10 MG PO TABS: 10.0000 mg | ORAL_TABLET | Freq: Two times a day (BID) | ORAL | 0 refills | Status: DC

## 2021-07-12 MED ORDER — LISINOPRIL 2.5 MG PO TABS: ORAL_TABLET | ORAL | 0 refills | Status: DC

## 2021-07-12 MED ORDER — METFORMIN HCL 1000 MG PO TABS
1000.0000 mg | ORAL_TABLET | Freq: Two times a day (BID) | ORAL | 0 refills | Status: DC
Start: 2021-07-12 — End: 2021-10-18

## 2021-07-12 MED ORDER — TRULICITY 1.5 MG/0.5ML ~~LOC~~ SOAJ: 1.5000 mg | SUBCUTANEOUS | 2 refills | Status: DC

## 2021-07-25 ENCOUNTER — Encounter: Payer: Self-pay | Admitting: Nutrition

## 2021-08-10 ENCOUNTER — Other Ambulatory Visit: Payer: Self-pay

## 2021-08-10 ENCOUNTER — Encounter: Payer: Self-pay | Admitting: Nutrition

## 2021-08-10 ENCOUNTER — Encounter: Payer: BC Managed Care – PPO | Attending: Internal Medicine | Admitting: Nutrition

## 2021-08-10 VITALS — Ht 67.0 in | Wt 250.8 lb

## 2021-08-10 DIAGNOSIS — E66812 Obesity, class 2: Secondary | ICD-10-CM

## 2021-08-10 DIAGNOSIS — I10 Essential (primary) hypertension: Secondary | ICD-10-CM

## 2021-08-10 DIAGNOSIS — F17200 Nicotine dependence, unspecified, uncomplicated: Secondary | ICD-10-CM

## 2021-08-10 DIAGNOSIS — E1165 Type 2 diabetes mellitus with hyperglycemia: Secondary | ICD-10-CM

## 2021-08-10 DIAGNOSIS — E782 Mixed hyperlipidemia: Secondary | ICD-10-CM

## 2021-08-10 DIAGNOSIS — E669 Obesity, unspecified: Secondary | ICD-10-CM | POA: Insufficient documentation

## 2021-08-10 NOTE — Progress Notes (Signed)
Time: 1500       End 1530 Follow up DM, Obesity  Assessment:  Primary concerns today: Diabetes Type 2. Dx 3 years. Sees Jonah Blue,  Changes made: Trying to eat three meals per day. Avoiding unhealthy foods.  Eating more fresh fruits and vegetables. Not as tired as much. Walking 3 times per week.Mariah [name] a lot on her job. Says she had a few slips up during holiday. On Trulicity now also. Still on  Metformin 1000 mg BID.  FBS 150's  Bedtime 180-200's.  Glipizide 10 mg BID.   Last A1C 11.7%. Previous A1C 8.9%.   She feels she was eating poorly. Eating better now.  Elevated LDL. Strong family history of heart disease. She is at high risk for a cardiovascular event. She does smoke also. Stressed need for dietary changes, weight loss, stop smoking and getting back on insulin for needed blood sugar control.   Lab Results  Component Value Date   HGBA1C 11.7 (A) 05/19/2021   CMP Latest Ref Rng & Units 05/19/2021 08/29/2020 05/02/2020  Glucose 65 - 99 mg/dL 681(L) 572(I) 203(T)  BUN 6 - 24 mg/dL 8 10 15   Creatinine 0.57 - 1.00 mg/dL 5.97 4.16  Sodium 134 - 144 mmol/L 141 140 136  Potassium 3.5 - 5.2 mmol/L 4.7 4.2 4.3  Chloride 96 - 106 mmol/L 104 107 105  CO2 20 - 29 mmol/L 22 22 21   Calcium 8.7 - 10.2 mg/dL 3.84 9.5 9.9  Total Protein 6.0 - 8.5 g/dL 7.3 7.1 7.3  Total Bilirubin 0.0 - 1.2 mg/dL 0.4 0.6 0.5  Alkaline Phos 44 - 121 IU/L 111 - -  AST 0 - 40 IU/L 10 10 7(L)  ALT 0 - 32 IU/L 8 13 10    Lipid Panel     Component Value Date/Time   CHOL 146 08/29/2020 0840   TRIG 54 08/29/2020 0840   HDL 36 (L) 08/29/2020 0840   CHOLHDL 4.1 08/29/2020 0840   LDLCALC 96 08/29/2020 0840   Wt Readings from Last 3 Encounters:  08/10/21 250 lb 12.8 oz (113.8 kg)  06/29/21 255 lb (115.7 kg)  05/19/21 253 lb 6.4 oz (114.9 kg)   Ht Readings from Last 3 Encounters:  08/10/21 5\' 7"  (1.702 m)  06/29/21 5\' 7"  (1.702 m)  05/19/21 5\' 8"  (1.727 m)   Body mass index is 39.28  kg/m. @BMIFA @ Facility age limit for growth percentiles is 20 years. Facility age limit for growth percentiles is 20 years.   Preferred Learning Style:  Visual and Read   Learning Readiness:   Ready Change in progress   MEDICATIONS:    DIETARY INTAKE:  24-hr recall:  B ( AM):  Chicken, mushroom, onions and salad, water OR Eggs, sausage, cheese crissant.  Snk ( AM):  L ( PM): Same as lunch Snk ( PM):  D ( PM):  CHicken, mushrooms, green onions and salad, water Snk ( PM): Beverages: water  Usual physical activity:  Walks some.  Estimated energy needs: 1200  calories 135 g carbohydrates 90 g protein 33 g fat  Progress Towards Goal(s):  In progress.   Nutritional Diagnosis:  NB-1.1 Food and nutrition-related knowledge deficit As related to Diabetes Type 2.  As evidenced by A1C >   1 4%.    Intervention:  Nutrition and Diabetes education provided on My Plate, CHO counting, meal planning, portion sizes, timing of meals, avoiding snacks between meals unless having a low blood sugar, target ranges for A1C and blood sugars,  signs/symptoms and treatment of hyper/hypoglycemia, monitoring blood sugars, taking medications as prescribed, benefits of exercising 30 minutes per day and prevention of complications of DM.  Lifestyle Medicine - Whole Food, Plant Predominant Nutrition is highly recommended: Eat Plenty of vegetables, Mushrooms, fruits, Legumes, Whole Grains, Nuts, seeds in lieu of processed meats, processed snacks/pastries red meat, poultry, eggs.    -It is better to avoid simple carbohydrates including: Cakes, Sweet Desserts, Ice Cream, Soda (diet and regular), Sweet Tea, Candies, Chips, Cookies, Store Bought Juices, Alcohol in Excess of  1-2 drinks a day, Lemonade,  Artificial Sweeteners, Doughnuts, Coffee Creamers, "Sugar-free" Products, etc, etc.  This is not a complete list.....  Exercise: If you are able: 30 -60 minutes a day ,4 days a week, or 150 minutes a week.   The longer the better.  Combine stretch, strength, and aerobic activities.  If you were told in the past that you have high risk for cardiovascular diseases, you may seek evaluation by your heart doctor prior to initiating moderate to intense exercise programs.  Goals  Work on meal planning. Being more mindful with the foods you choose to eat. Keep up to 100 oz per day.-6 bottles per day. Get back to aerobics and  more exercises to 1`50 minutes a week. Lose 1 lb per week Bedtime blood sugars less than 150-180 and less than 130 in am.    Teaching Method Utilized:  Visual Auditory   Handouts given during visit include: The Plate Method   Barriers to learning/adherence to lifestyle change: none  Demonstrated degree of understanding via:  Teach Back   Monitoring/Evaluation:  Dietary intake, exercise, , and body weight in 3 month(s). Recommend to start long acting insulin to help get blood sugars back dow if BS before meals are still over 180 mg/dl consistently. Marland Kitchen

## 2021-08-10 NOTE — Patient Instructions (Addendum)
Goals  Work on meal planning. Being more mindful with the foods you choose to eat. Keep up to 100 oz per day.-6 bottles per day. Get back to aerobics and  more exercises to 1`50 minutes a week. Lose 1 lb per week Bedtime blood sugars less than 150-180 and less than 130 in am.

## 2021-08-16 ENCOUNTER — Ambulatory Visit: Payer: BC Managed Care – PPO | Admitting: Pharmacist

## 2021-08-30 ENCOUNTER — Encounter: Payer: Self-pay | Admitting: Nutrition

## 2021-09-05 ENCOUNTER — Other Ambulatory Visit: Payer: Self-pay | Admitting: Internal Medicine

## 2021-09-05 NOTE — Telephone Encounter (Signed)
Copied from CRM [id number]. Topic: Quick Communication - Rx Refill/Question >> Sep 05, 2021 11:56 AM Gaetana Michaelis A wrote: Medication: Dulaglutide (TRULICITY) 1.5 MG/0.5ML SOPN [[id number]]   Has the patient contacted their pharmacy? Yes.  The patient has been directed to contact their PCP (Agent: If no, request that the patient contact the pharmacy for the refill. If patient does not wish to contact the pharmacy document the reason why and proceed with request.) (Agent: If yes, when and what did the pharmacy advise?)  Preferred Pharmacy (with phone number or street name): [facility] [place] - [address] AT [facility] mail services [address] [facility] [place] [id number] Phone: [phone] Fax: [phone]   Has the patient been seen for an appointment in the last year OR does the patient have an upcoming appointment? Yes.    Agent: Please be advised that RX refills may take up to 3 business days. We ask that you follow-up with your pharmacy.

## 2021-09-06 MED ORDER — TRULICITY 1.5 MG/0.5ML ~~LOC~~ SOAJ: 1.5000 mg | SUBCUTANEOUS | 2 refills | Status: DC

## 2021-09-06 NOTE — Telephone Encounter (Signed)
Requested Prescriptions  Pending Prescriptions Disp Refills   Dulaglutide (TRULICITY) 1.5 MG/0.5ML SOPN 2 mL 2    Sig: Inject 1.5 mg into the skin once a week.     Endocrinology:  Diabetes - GLP-1 Receptor Agonists Failed - 09/05/2021  6:40 PM      Failed - HBA1C is between 0 and 7.9 and within 180 days    Hgb A1C (fingerstick)  Date Value Ref Range Status  01/12/2019 >14.0 (H) <6.0 % OF TOTAL HGB Final   HbA1c, POC (controlled diabetic range)  Date Value Ref Range Status  05/19/2021 11.7 (A) 0.0 - 7.0 % Final         Passed - Valid encounter within last 6 months    Recent Outpatient Visits          1 month ago Type 2 diabetes mellitus with obesity [facility])   [place] [facility] [place], Cornelius Moras, RPH-CPP   2 months ago Type 2 diabetes mellitus with obesity [facility])   [facility] [place], Cornelius Moras, RPH-CPP   3 months ago Establishing care with new doctor, encounter for   [facility] Marcine Matar, MD      Future Appointments            In 2 weeks Marcine Matar, MD [facility]

## 2021-09-22 ENCOUNTER — Encounter: Payer: Self-pay | Admitting: Internal Medicine

## 2021-09-22 ENCOUNTER — Other Ambulatory Visit: Payer: Self-pay

## 2021-09-22 ENCOUNTER — Ambulatory Visit: Payer: BC Managed Care – PPO | Attending: Internal Medicine | Admitting: Internal Medicine

## 2021-09-22 VITALS — BP 121/82 | HR 98 | Resp 16 | Ht 67.0 in | Wt 248.6 lb

## 2021-09-22 DIAGNOSIS — F17200 Nicotine dependence, unspecified, uncomplicated: Secondary | ICD-10-CM

## 2021-09-22 DIAGNOSIS — E785 Hyperlipidemia, unspecified: Secondary | ICD-10-CM

## 2021-09-22 DIAGNOSIS — E1169 Type 2 diabetes mellitus with other specified complication: Secondary | ICD-10-CM

## 2021-09-22 DIAGNOSIS — E669 Obesity, unspecified: Secondary | ICD-10-CM

## 2021-09-22 LAB — GLUCOSE, POCT (MANUAL RESULT ENTRY): POC Glucose: 123 mg/dl — AB (ref 70–99)

## 2021-09-22 LAB — POCT GLYCOSYLATED HEMOGLOBIN (HGB A1C): HbA1c, POC (controlled diabetic range): 9.4 % — AB (ref 0.0–7.0)

## 2021-09-22 NOTE — Progress Notes (Signed)
Patient ID: Mariah ChimeSusan A [name], female    DOB: 12/26/1975  MRN: [id number]  CC: Diabetes   Subjective: Mariah [name] is a 46 y.o. female who presents for chronic ds management Her concerns today include:  Patient with history of DM type II, HL, obesity, tobacco dependence, endometrial hyperplasia   DIABETES TYPE 2/Obesity Last A1C:   Results for orders placed or performed in visit on 09/22/21  POCT glucose (manual entry)  Result Value Ref Range   POC Glucose 123 (A) 70 - 99 mg/dl  POCT glycosylated hemoglobin (Hb A1C)  Result Value Ref Range   Hemoglobin A1C     HbA1c POC (<> result, manual entry)     HbA1c, POC (prediabetic range)     HbA1c, POC (controlled diabetic range) 9.4 (A) 0.0 - 7.0 %    Med Adherence:  [x]  Yes   Medication side effects:  [x]  Yes    []  No Home Monitoring?  [x]  Yes once a day before BF  Home glucose results range:  recent readings starting from yesterday were:  118, 104, 144, 217, 231, 143, 243, 138 Diet Adherence: Doing better; less sugar and making better choices.  Saw nutritionist 2x since last visit with me and found it helpful. Exercise: []  Yes    [x]  No plans to do better Hypoglycemic episodes?: []  Yes    [x]  No Numbness of the feet? []  Yes    [x]  No Retinopathy hx? []  Yes    []  No Last eye exam: last eye exam at [facility] 06/27/2020.  She plans to call and schedule new appointment. Comments:   HL:  taking and tolerating Lipitor.  Tob dep:  indicated desire to quit on last visit.  Given nicotine patches.  Did get them but never used them. She has cut down from 1 pk to 1/2 pk/day but feels she is not totally ready to quit mentally.   Patient Active Problem List   Diagnosis Date Noted   Hyperlipidemia 06/03/2017   DM (diabetes mellitus), type 2, uncontrolled 06/03/2017   Class 3 obesity (HCC) 06/03/2017   Tobacco use disorder 06/03/2017   History of simple endometrial hyperplasia without atypia 03/15/2011     Current Outpatient  Medications on File Prior to Visit  Medication Sig Dispense Refill   atorvastatin (LIPITOR) 80 MG tablet Take 1 tablet (80 mg total) by mouth daily. 90 tablet 0   Dulaglutide (TRULICITY) 1.5 MG/0.5ML SOPN Inject 1.5 mg into the skin once a week. 2 mL 2   glipiZIDE (GLUCOTROL) 10 MG tablet Take 1 tablet (10 mg total) by mouth 2 (two) times daily before a meal. 180 tablet 0   lisinopril (ZESTRIL) 2.5 MG tablet Take 1 tablet by mouth once daily 90 tablet 0   megestrol (MEGACE) 40 MG tablet TAKE 1 TABLET BY MOUTH ONCE DAILY. CAN INCREASE TO 2 TABLETS EVERY DAY IN THE EVENT OF HEAVY BLEEDING 90 tablet 4   metFORMIN (GLUCOPHAGE) 1000 MG tablet Take 1 tablet (1,000 mg total) by mouth 2 (two) times daily with a meal. 180 tablet 0   Multiple Vitamin (MULTIVITAMIN WITH MINERALS) TABS tablet Take 1 tablet by mouth daily.     Multiple Vitamins-Minerals (AIRBORNE PO) Take by mouth.     nicotine (NICODERM CQ - DOSED IN MG/24 HOURS) 21 mg/24hr patch Place 1 patch (21 mg total) onto the skin daily. (Patient not taking: Reported on 06/29/2021) 28 patch 1   promethazine-dextromethorphan (PROMETHAZINE-DM) 6.25-15 MG/5ML syrup Take 5 mLs by mouth  4 (four) times daily as needed for cough. (Patient not taking: Reported on 06/29/2021) 140 mL 0   No current facility-administered medications on file prior to visit.    Allergies  Allergen Reactions   Farxiga [Dapagliflozin] Itching    Social History   Socioeconomic History   Marital status: Single    Spouse name: Not on file   Number of children: Not on file   Years of education: Not on file   Highest education level: Not on file  Occupational History   Not on file  Tobacco Use   Smoking status: Every Day    Packs/day: 1.00    Years: 14.00    Pack years: 14.00    Types: Cigarettes   Smokeless tobacco: Never  Vaping Use   Vaping Use: Never used  Substance and Sexual Activity   Alcohol use: Yes    Comment: occassonially   Drug use: No   Sexual  activity: Not Currently  Other Topics Concern   Not on file  Social History Narrative   Not on file   Social Determinants of Health   Financial Resource Strain: Not on file  Food Insecurity: Not on file  Transportation Needs: Not on file  Physical Activity: Not on file  Stress: Not on file  Social Connections: Not on file  Intimate Partner Violence: Not on file    Family History  Problem Relation Age of Onset   Diabetes Mother    Heart disease Mother    Hyperlipidemia Mother    Diabetes Maternal Grandmother    Diabetes Maternal Grandfather    Cancer Paternal Grandmother    Breast cancer Paternal Grandmother     Past Surgical History:  Procedure Laterality Date   APPENDECTOMY     CYSTECTOMY  1989   ENDOMETRIAL BIOPSY  02/13/11   AUB   HYSTEROSCOPY WITH D & C  04/16/2011   Procedure: DILATATION AND CURETTAGE (D&C) /HYSTEROSCOPY;  Surgeon: Tereso Newcomer, MD;  Location: [facility];  Service: Gynecology;  Laterality: N/A;  Diagnostic   polyp removal  2010    ROS: Review of Systems Negative except as stated above  PHYSICAL EXAM: BP 121/82    Pulse 98    Resp 16    Ht 5\' 7"  (1.702 m)    Wt 248 lb 9.6 oz (112.8 kg)    SpO2 100%    BMI 38.94 kg/m   Wt Readings from Last 3 Encounters:  09/22/21 248 lb 9.6 oz (112.8 kg)  08/10/21 250 lb 12.8 oz (113.8 kg)  06/29/21 255 lb (115.7 kg)    Physical Exam  General appearance - alert, well appearing, middle-aged African-American female and in no distress Mental status - normal mood, behavior, speech, dress, motor activity, and thought processes Neck - supple, no significant adenopathy Chest - clear to auscultation, no wheezes, rales or rhonchi, symmetric air entry Heart - normal rate, regular rhythm, normal S1, S2, no murmurs, rubs, clicks or gallops Extremities - peripheral pulses normal, no pedal edema, no clubbing or cyanosis   CMP Latest Ref Rng & Units 05/19/2021 08/29/2020 05/02/2020  Glucose 65 - 99 mg/dL 05/04/2020) 767(M)  094(B)  BUN 6 - 24 mg/dL 8 10 15   Creatinine 0.57 - 1.00 mg/dL 096(G 8.36  Sodium 134 - 144 mmol/L 141 140 136  Potassium 3.5 - 5.2 mmol/L 4.7 4.2 4.3  Chloride 96 - 106 mmol/L 104 107 105  CO2 20 - 29 mmol/L 22 22 21   Calcium 8.7 - 10.2 mg/dL  10.2 9.5 9.9  Total Protein 6.0 - 8.5 g/dL 7.3 7.1 7.3  Total Bilirubin 0.0 - 1.2 mg/dL 0.4 0.6 0.5  Alkaline Phos 44 - 121 IU/L 111 - -  AST 0 - 40 IU/L 10 10 7(L)  ALT 0 - 32 IU/L 8 13 10    Lipid Panel     Component Value Date/Time   CHOL 146 08/29/2020 0840   TRIG 54 08/29/2020 0840   HDL 36 (L) 08/29/2020 0840   CHOLHDL 4.1 08/29/2020 0840   LDLCALC 96 08/29/2020 0840    CBC    Component Value Date/Time   WBC 9.7 08/29/2020 0840   RBC 5.13 (H) 08/29/2020 0840   HGB 13.8 08/29/2020 0840   HCT 41.8 08/29/2020 0840   PLT 360 08/29/2020 0840   MCV 81.5 08/29/2020 0840   MCH 26.9 (L) 08/29/2020 0840   MCHC 33.0 08/29/2020 0840   RDW 12.7 08/29/2020 0840   LYMPHSABS 3,288 08/29/2020 0840   EOSABS 378 08/29/2020 0840   BASOSABS 58 08/29/2020 0840    ASSESSMENT AND PLAN: 1. Type 2 diabetes mellitus with obesity (HCC) A1c has decreased but still not at goal.  Commended her on the changes that she has made in her eating habits so far and encouraged her to continue. Strongly encouraged her to try to get in some form of moderate intensity exercise several days a week.  Start low and go slow. -We discussed adding low-dose of Lantus insulin to her current medications to try to get her A1c closer to goal.  Patient would like to hold off on adding any medicine for now.  She will continue to work on her eating habits and try to move more.  By next visit if A1c still elevated, she would be agreeable to adding insulin.  For now she will continue Trulicity 1.5 mg/week, metformin and Glucotrol.  She is intolerant of SGLT inhibitors - POCT glucose (manual entry) - POCT glycosylated hemoglobin (Hb A1C) - Microalbumin / creatinine urine ratio -  CBC  2. Hyperlipidemia associated with type 2 diabetes mellitus (HCC) Continue atorvastatin. - Lipid panel  3. Tobacco dependence Continue to encourage her to quit.  When she is mentally ready she will use the nicotine patches that she already has.     Patient was given the opportunity to ask questions.  Patient verbalized understanding of the plan and was able to repeat key elements of the plan.   Orders Placed This Encounter  Procedures   Microalbumin / creatinine urine ratio   POCT glucose (manual entry)   POCT glycosylated hemoglobin (Hb A1C)     Requested Prescriptions    No prescriptions requested or ordered in this encounter    No follow-ups on file.  08/31/2020, MD, FACP

## 2021-09-22 NOTE — Patient Instructions (Signed)

## 2021-09-23 LAB — MICROALBUMIN / CREATININE URINE RATIO
Creatinine, Urine: 137.7 mg/dL
Microalb/Creat Ratio: 6 mg/g creat (ref 0–29)
Microalbumin, Urine: 7.6 ug/mL

## 2021-09-23 LAB — LIPID PANEL
Chol/HDL Ratio: 4.4 ratio (ref 0.0–4.4)
Cholesterol, Total: 129 mg/dL (ref 100–199)
HDL: 29 mg/dL — ABNORMAL LOW (ref >39)
LDL Chol Calc (NIH): 87 mg/dL (ref 0–99)
Triglycerides: 58 mg/dL (ref 0–149)
VLDL Cholesterol Cal: 13 mg/dL (ref 5–40)

## 2021-09-23 LAB — CBC
Hematocrit: 39.9 % (ref 34.0–46.6)
Hemoglobin: 13.2 g/dL (ref 11.1–15.9)
MCH: 26.2 pg — ABNORMAL LOW (ref 26.6–33.0)
MCHC: 33.1 g/dL (ref 31.5–35.7)
MCV: 79 fL (ref 79–97)
Platelets: 366 10*3/uL (ref 150–450)
RBC: 5.03 x10E6/uL (ref 3.77–5.28)
RDW: 13.5 % (ref 11.7–15.4)
WBC: 9.4 10*3/uL (ref 3.4–10.8)

## 2021-10-12 ENCOUNTER — Ambulatory Visit: Payer: BC Managed Care – PPO | Admitting: Nutrition

## 2021-10-18 ENCOUNTER — Other Ambulatory Visit: Payer: Self-pay | Admitting: Internal Medicine

## 2021-10-18 MED ORDER — METFORMIN HCL 1000 MG PO TABS: 1000.0000 mg | ORAL_TABLET | Freq: Two times a day (BID) | ORAL | 1 refills | Status: DC

## 2021-10-18 MED ORDER — LISINOPRIL 2.5 MG PO TABS: ORAL_TABLET | ORAL | 1 refills | Status: DC

## 2021-10-18 MED ORDER — GLIPIZIDE 10 MG PO TABS: 10.0000 mg | ORAL_TABLET | Freq: Two times a day (BID) | ORAL | 1 refills | Status: DC

## 2021-11-02 ENCOUNTER — Encounter: Payer: BC Managed Care – PPO | Attending: Internal Medicine | Admitting: Nutrition

## 2021-11-02 ENCOUNTER — Encounter: Payer: Self-pay | Admitting: Nutrition

## 2021-11-02 VITALS — Ht 67.0 in | Wt 249.6 lb

## 2021-11-02 DIAGNOSIS — E782 Mixed hyperlipidemia: Secondary | ICD-10-CM

## 2021-11-02 DIAGNOSIS — E1165 Type 2 diabetes mellitus with hyperglycemia: Secondary | ICD-10-CM

## 2021-11-02 DIAGNOSIS — I10 Essential (primary) hypertension: Secondary | ICD-10-CM

## 2021-11-02 DIAGNOSIS — F17200 Nicotine dependence, unspecified, uncomplicated: Secondary | ICD-10-CM

## 2021-11-02 NOTE — Progress Notes (Unsigned)
Time: 1500 End 1530 Follow up DM, Obesity  Assessment:  Primary concerns today: Diabetes Type 2. Dx 3 years. Sees Jonah Blue,  Forgot her meter FBS 130-244 mg/dl.  Works FT in [employer]. A1C 9.4%.Truliciity;, Glipidize 10 mg BID,, Metformin 1000 mg BID. Has been walking with her mom 3-4 times per week. Walks about 20-30 minutes.     Changes made: Trying to eat three meals per day. Avoiding unhealthy foods.  Eating more fresh fruits and vegetables. Not as tired as much. Walking 3 times per week.Meredeth Ide a lot on her job. Says she had a few slips up during holiday. On Trulicity now also. Still on  Metformin 1000 mg BID.  FBS 150's  Bedtime 180-200's.  Glipizide 10 mg BID.   Last A1C 11.7%. Previous A1C 8.9%.   She feels she was eating poorly. Eating better now.  Elevated LDL. Strong family history of heart disease. She is at high risk for a cardiovascular event. She does smoke also. Stressed need for dietary changes, weight loss, stop smoking and getting back on insulin for needed blood sugar control.   Lab Results  Component Value Date   HGBA1C 9.4 (A) 09/22/2021   CMP Latest Ref Rng & Units 05/19/2021 08/29/2020 05/02/2020  Glucose 65 - 99 mg/dL 144(Y) 185(U) 314(H)  BUN 6 - 24 mg/dL 8 10 15   Creatinine 0.57 - 1.00 mg/dL 7.02 6.37  Sodium 134 - 144 mmol/L 141 140 136  Potassium 3.5 - 5.2 mmol/L 4.7 4.2 4.3  Chloride 96 - 106 mmol/L 104 107 105  CO2 20 - 29 mmol/L 22 22 21   Calcium 8.7 - 10.2 mg/dL 8.58 9.5 9.9  Total Protein 6.0 - 8.5 g/dL 7.3 7.1 7.3  Total Bilirubin 0.0 - 1.2 mg/dL 0.4 0.6 0.5  Alkaline Phos 44 - 121 IU/L 111 - -  AST 0 - 40 IU/L 10 10 7(L)  ALT 0 - 32 IU/L 8 13 10    Lipid Panel     Component Value Date/Time   CHOL 129 09/22/2021 1009   TRIG 58 09/22/2021 1009   HDL 29 (L) 09/22/2021 1009   CHOLHDL 4.4 09/22/2021 1009   CHOLHDL 4.1 08/29/2020 0840   LDLCALC 87 09/22/2021 1009   LDLCALC 96 08/29/2020 0840   Wt Readings from Last 3  Encounters:  09/22/21 248 lb 9.6 oz (112.8 kg)  08/10/21 250 lb 12.8 oz (113.8 kg)  06/29/21 255 lb (115.7 kg)   Ht Readings from Last 3 Encounters:  09/22/21 5\' 7"  (1.702 m)  08/10/21 5\' 7"  (1.702 m)  06/29/21 5\' 7"  (1.702 m)   There is no height or weight on file to calculate BMI. @BMIFA @ Facility age limit for growth percentiles is 20 years. Facility age limit for growth percentiles is 20 years.   Preferred Learning Style:  Visual and Read   Learning Readiness:   Ready Change in progress   MEDICATIONS:    DIETARY INTAKE:  24-hr recall:  B ( AM): FBS 166 mg/dl. Pb sandwich, water L ( PM): Spaghetti, 1 cup, water Snk ( PM):  D ( PM):  Chicken, broccoli, tomatoes,  Water. Snk ( PM):  Beverages: water  Usual physical activity:  Walks some.  Estimated energy needs: 1200  calories 135 g carbohydrates 90 g protein 33 g fat  Progress Towards Goal(s):  In progress.   Nutritional Diagnosis:  NB-1.1 Food and nutrition-related knowledge deficit As related to Diabetes Type 2.  As evidenced by A1C >   1  4%.    Intervention:  Nutrition and Diabetes education provided on My Plate, CHO counting, meal planning, portion sizes, timing of meals, avoiding snacks between meals unless having a low blood sugar, target ranges for A1C and blood sugars, signs/symptoms and treatment of hyper/hypoglycemia, monitoring blood sugars, taking medications as prescribed, benefits of exercising 30 minutes per day and prevention of complications of DM.  Lifestyle Medicine - Whole Food, Plant Predominant Nutrition is highly recommended: Eat Plenty of vegetables, Mushrooms, fruits, Legumes, Whole Grains, Nuts, seeds in lieu of processed meats, processed snacks/pastries red meat, poultry, eggs.    -It is better to avoid simple carbohydrates including: Cakes, Sweet Desserts, Ice Cream, Soda (diet and regular), Sweet Tea, Candies, Chips, Cookies, Store Bought Juices, Alcohol in Excess of  1-2 drinks a  day, Lemonade,  Artificial Sweeteners, Doughnuts, Coffee Creamers, "Sugar-free" Products, etc, etc.  This is not a complete list.....  Exercise: If you are able: 30 -60 minutes a day ,4 days a week, or 150 minutes a week.  The longer the better.  Combine stretch, strength, and aerobic activities.  If you were told in the past that you have high risk for cardiovascular diseases, you may seek evaluation by your heart doctor prior to initiating moderate to intense exercise programs.  Goals  Work on meal planning. Being more mindful with the foods you choose to eat. Keep up to 100 oz per day.-6 bottles per day. Get back to aerobics and  more exercises to 1`50 minutes a week. Lose 1 lb per week Bedtime blood sugars less than 150-180 and less than 130 in am.    Teaching Method Utilized:  Visual Auditory   Handouts given during visit include: The Plate Method   Barriers to learning/adherence to lifestyle change: none  Demonstrated degree of understanding via:  Teach Back   Monitoring/Evaluation:  Dietary intake, exercise, , and body weight in 3 month(s). Recommend to start long acting insulin to help get blood sugars back dow if BS before meals are still over 180 mg/dl consistently. Marland Kitchen

## 2021-11-02 NOTE — Patient Instructions (Signed)
GOals ? ?Increase exercise to 45 minutes 3-4 times per week ?Increae low carb vegetables to 2 svg with lunch and dinner. ?Lose 1 lb per week ?Limit carbs to 30 g per meal ?Increase protein in plant based foods ?Get A1C down to 7% ? ?

## 2021-11-14 ENCOUNTER — Encounter: Payer: Self-pay | Admitting: Nutrition

## 2021-11-18 ENCOUNTER — Encounter: Payer: Self-pay | Admitting: Radiology

## 2021-11-24 ENCOUNTER — Other Ambulatory Visit: Payer: Self-pay | Admitting: Internal Medicine

## 2021-11-24 MED ORDER — ATORVASTATIN CALCIUM 80 MG PO TABS: 80.0000 mg | ORAL_TABLET | Freq: Every day | ORAL | 1 refills | Status: DC

## 2021-12-13 LAB — HM DIABETES EYE EXAM

## 2022-01-23 ENCOUNTER — Ambulatory Visit: Payer: BC Managed Care – PPO | Attending: Internal Medicine | Admitting: Internal Medicine

## 2022-01-23 VITALS — BP 127/82 | HR 111 | Ht 67.0 in | Wt 246.0 lb

## 2022-01-23 DIAGNOSIS — E785 Hyperlipidemia, unspecified: Secondary | ICD-10-CM | POA: Diagnosis not present

## 2022-01-23 DIAGNOSIS — R1030 Lower abdominal pain, unspecified: Secondary | ICD-10-CM | POA: Diagnosis not present

## 2022-01-23 DIAGNOSIS — Z6838 Body mass index (BMI) 38.0-38.9, adult: Secondary | ICD-10-CM

## 2022-01-23 DIAGNOSIS — E1169 Type 2 diabetes mellitus with other specified complication: Secondary | ICD-10-CM

## 2022-01-23 DIAGNOSIS — F17210 Nicotine dependence, cigarettes, uncomplicated: Secondary | ICD-10-CM | POA: Diagnosis not present

## 2022-01-23 DIAGNOSIS — Z1211 Encounter for screening for malignant neoplasm of colon: Secondary | ICD-10-CM

## 2022-01-23 DIAGNOSIS — E669 Obesity, unspecified: Secondary | ICD-10-CM

## 2022-01-23 DIAGNOSIS — E119 Type 2 diabetes mellitus without complications: Secondary | ICD-10-CM

## 2022-01-23 DIAGNOSIS — F17200 Nicotine dependence, unspecified, uncomplicated: Secondary | ICD-10-CM

## 2022-01-23 LAB — POCT URINALYSIS DIP (CLINITEK)
Bilirubin, UA: NEGATIVE
Blood, UA: NEGATIVE
Glucose, UA: NEGATIVE mg/dL
Ketones, POC UA: NEGATIVE mg/dL
Nitrite, UA: NEGATIVE
POC PROTEIN,UA: NEGATIVE
Spec Grav, UA: >=1.03 — AB (ref 1.010–1.025)
Urobilinogen, UA: 1 E.U./dL
pH, UA: 6 (ref 5.0–8.0)

## 2022-01-23 LAB — POCT GLYCOSYLATED HEMOGLOBIN (HGB A1C): HbA1c, POC (controlled diabetic range): 8.7 % — AB (ref 0.0–7.0)

## 2022-01-23 LAB — GLUCOSE, POCT (MANUAL RESULT ENTRY): POC Glucose: 150 mg/dl — AB (ref 70–99)

## 2022-01-23 MED ORDER — TRULICITY 3 MG/0.5ML ~~LOC~~ SOAJ: 3.0000 mg | SUBCUTANEOUS | 6 refills | Status: DC

## 2022-01-23 NOTE — Progress Notes (Signed)
Patient ID: Mariah [name], female    DOB: 1976-06-26  MRN: [id number]  CC: Diabetes   Subjective: Mariah [name] is a 46 y.o. female who presents for chronic ds management Her concerns today include:  Patient with history of DM type II, HL, obesity, tobacco dependence, endometrial hyperplasia   C/o intermittent suprapubic pain that started about 1 mth ago and has happen about 1-2 x.  Feels like cramps and can last a few hrs. No dysuria or abnormal vagina dischg.  She has not been sexually active in a while. No N/V or fever On Megestrol 40 mg daily by GYN x a few yrs.  Does not get cycles with it.   Was moving bowels okay.  Usually gets a little constipation 2-3 days after Trulicity inj    DM:  checking BS before BF every morning.  Gives range 130-150 Taking meds consistently: Doing better with eating habits:  cooking more at home and staying off sugary drinks Exercising more.  Walks 3 days a wk for 30 mins.  She has loss about 15 pounds in the last year.  HL:  taking and tolerating Lipitor  Tob dep:  has increase from 1/2 pk.  Feels she needs to quit.  She has the nicotine patches at home.  She states she will use them when she decides to give a trial of quitting.  HM: Due for colon CA screening.  No fhx history of colon CA.   Patient Active Problem List   Diagnosis Date Noted   Hyperlipidemia 06/03/2017   DM (diabetes mellitus), type 2, uncontrolled 06/03/2017   Class 3 obesity (HCC) 06/03/2017   Tobacco use disorder 06/03/2017   History of simple endometrial hyperplasia without atypia 03/15/2011     Current Outpatient Medications on File Prior to Visit  Medication Sig Dispense Refill   atorvastatin (LIPITOR) 80 MG tablet Take 1 tablet (80 mg total) by mouth daily. 90 tablet 1   Dulaglutide (TRULICITY) 1.5 MG/0.5ML SOPN Inject 1.5 mg into the skin once a week. 2 mL 2   glipiZIDE (GLUCOTROL) 10 MG tablet Take 1 tablet (10 mg total) by mouth 2 (two) times daily before  a meal. 180 tablet 1   lisinopril (ZESTRIL) 2.5 MG tablet Take 1 tablet by mouth once daily 90 tablet 1   megestrol (MEGACE) 40 MG tablet TAKE 1 TABLET BY MOUTH ONCE DAILY. CAN INCREASE TO 2 TABLETS EVERY DAY IN THE EVENT OF HEAVY BLEEDING 90 tablet 4   metFORMIN (GLUCOPHAGE) 1000 MG tablet Take 1 tablet (1,000 mg total) by mouth 2 (two) times daily with a meal. 180 tablet 1   Multiple Vitamin (MULTIVITAMIN WITH MINERALS) TABS tablet Take 1 tablet by mouth daily.     Multiple Vitamins-Minerals (AIRBORNE PO) Take by mouth.     nicotine (NICODERM CQ - DOSED IN MG/24 HOURS) 21 mg/24hr patch Place 1 patch (21 mg total) onto the skin daily. (Patient not taking: Reported on 06/29/2021) 28 patch 1   promethazine-dextromethorphan (PROMETHAZINE-DM) 6.25-15 MG/5ML syrup Take 5 mLs by mouth 4 (four) times daily as needed for cough. (Patient not taking: Reported on 06/29/2021) 140 mL 0   No current facility-administered medications on file prior to visit.    Allergies  Allergen Reactions   Farxiga [Dapagliflozin] Itching    Social History   Socioeconomic History   Marital status: Single    Spouse name: Not on file   Number of children: Not on file   Years of education: Not on  file   Highest education level: Not on file  Occupational History   Not on file  Tobacco Use   Smoking status: Every Day    Packs/day: 1.00    Years: 14.00    Pack years: 14.00    Types: Cigarettes   Smokeless tobacco: Never  Vaping Use   Vaping Use: Never used  Substance and Sexual Activity   Alcohol use: Yes    Comment: occassonially   Drug use: No   Sexual activity: Not Currently  Other Topics Concern   Not on file  Social History Narrative   Not on file   Social Determinants of Health   Financial Resource Strain: Not on file  Food Insecurity: Not on file  Transportation Needs: Not on file  Physical Activity: Not on file  Stress: Not on file  Social Connections: Not on file  Intimate Partner  Violence: Not on file    Family History  Problem Relation Age of Onset   Diabetes Mother    Heart disease Mother    Hyperlipidemia Mother    Diabetes Maternal Grandmother    Diabetes Maternal Grandfather    Cancer Paternal Grandmother    Breast cancer Paternal Grandmother     Past Surgical History:  Procedure Laterality Date   APPENDECTOMY     CYSTECTOMY  1989   ENDOMETRIAL BIOPSY  02/13/11   AUB   HYSTEROSCOPY WITH D & C  04/16/2011   Procedure: DILATATION AND CURETTAGE (D&C) /HYSTEROSCOPY;  Surgeon: Tereso Newcomer, MD;  Location: [facility];  Service: Gynecology;  Laterality: N/A;  Diagnostic   polyp removal  2010    ROS: Review of Systems Negative except as stated above  PHYSICAL EXAM: BP 127/82   Pulse (!) 111   Ht 5\' 7"  (1.702 m)   Wt 246 lb (111.6 kg)   SpO2 98%   BMI 38.53 kg/m   Wt Readings from Last 3 Encounters:  01/23/22 246 lb (111.6 kg)  11/02/21 249 lb 9.6 oz (113.2 kg)  09/22/21 248 lb 9.6 oz (112.8 kg)    Physical Exam  General appearance - alert, well appearing, and in no distress Mental status - normal mood, behavior, speech, dress, motor activity, and thought processes Chest - clear to auscultation, no wheezes, rales or rhonchi, symmetric air entry Heart - normal rate, regular rhythm, normal S1, S2, no murmurs, rubs, clicks or gallops Abdomen -normal bowel sounds, nondistended, soft, slight suprapubic tenderness to the left.  This does not persist.  No guarding or rebound. Extremities - peripheral pulses normal, no pedal edema, no clubbing or cyanosis Diabetic Foot Exam - Simple   Simple Foot Form Diabetic Foot exam was performed with the following findings: Yes 01/23/2022  3:51 PM  Visual Inspection No deformities, no ulcerations, no other skin breakdown bilaterally: Yes Sensation Testing Intact to touch and monofilament testing bilaterally: Yes Pulse Check Posterior Tibialis and Dorsalis pulse intact bilaterally: Yes Comments     Results  for orders placed or performed in visit on 01/23/22  POCT glucose (manual entry)  Result Value Ref Range   POC Glucose 150 (A) 70 - 99 mg/dl  POCT glycosylated hemoglobin (Hb A1C)  Result Value Ref Range   Hemoglobin A1C     HbA1c POC (<> result, manual entry)     HbA1c, POC (prediabetic range)     HbA1c, POC (controlled diabetic range) 8.7 (A) 0.0 - 7.0 %  POCT URINALYSIS DIP (CLINITEK)  Result Value Ref Range   Color, UA yellow  yellow   Clarity, UA cloudy (A) clear   Glucose, UA negative negative mg/dL   Bilirubin, UA negative negative   Ketones, POC UA negative negative mg/dL   Spec Grav, UA >=0.272>=1.030 (A) 1.010 - 1.025   Blood, UA negative negative   pH, UA 6.0 5.0 - 8.0   POC PROTEIN,UA negative negative, trace   Urobilinogen, UA 1.0 0.2 or 1.0 E.U./dL   Nitrite, UA Negative Negative   Leukocytes, UA Trace (A) Negative       Latest Ref Rng & Units 05/19/2021    9:32 AM 08/29/2020    8:40 AM 05/02/2020    8:50 AM  CMP  Glucose 65 - 99 mg/dL 536199   644256   034151    BUN 6 - 24 mg/dL 8   10   15     Creatinine 0.57 - 1.00 mg/dL 7.420.80   5.950.78   6.380.69    Sodium 134 - 144 mmol/L 141   140   136    Potassium 3.5 - 5.2 mmol/L 4.7   4.2   4.3    Chloride 96 - 106 mmol/L 104   107   105    CO2 20 - 29 mmol/L 22   22   21     Calcium 8.7 - 10.2 mg/dL 75.610.2   9.5   9.9    Total Protein 6.0 - 8.5 g/dL 7.3   7.1   7.3    Total Bilirubin 0.0 - 1.2 mg/dL 0.4   0.6   0.5    Alkaline Phos 44 - 121 IU/L 111      AST 0 - 40 IU/L 10   10   7     ALT 0 - 32 IU/L 8   13   10      Lipid Panel     Component Value Date/Time   CHOL 129 09/22/2021 1009   TRIG 58 09/22/2021 1009   HDL 29 (L) 09/22/2021 1009   CHOLHDL 4.4 09/22/2021 1009   CHOLHDL 4.1 08/29/2020 0840   LDLCALC 87 09/22/2021 1009   LDLCALC 96 08/29/2020 0840    CBC    Component Value Date/Time   WBC 9.4 09/22/2021 1009   WBC 9.7 08/29/2020 0840   RBC 5.03 09/22/2021 1009   RBC 5.13 (H) 08/29/2020 0840   HGB 13.2 09/22/2021 1009    HCT 39.9 09/22/2021 1009   PLT 366 09/22/2021 1009   MCV 79 09/22/2021 1009   MCH 26.2 (L) 09/22/2021 1009   MCH 26.9 (L) 08/29/2020 0840   MCHC 33.1 09/22/2021 1009   MCHC 33.0 08/29/2020 0840   RDW 13.5 09/22/2021 1009   LYMPHSABS 3,288 08/29/2020 0840   EOSABS 378 08/29/2020 0840   BASOSABS 58 08/29/2020 0840    ASSESSMENT AND PLAN: 1. Type 2 diabetes mellitus with obesity (HCC) A1c has improved but is not at goal.  We discussed several options which included increasing the Trulicity to 3 mg once a week versus adding daily dose of long-acting insulin.  We agreed to increase the Trulicity.  Commended her on changes that she has made in her eating habits so far.  Encouraged her to continue trying to eat healthy and continue regular exercise. Patient asked about taking [place] supplement to help with diabetes and blood pressure.  I tried looking this up on up-to-date and found no information about it so I could not advise her.  However I told her that a lot of the supplements are not regulated by  the FDA so advised against it - POCT glucose (manual entry) - POCT glycosylated hemoglobin (Hb A1C) - Dulaglutide (TRULICITY) 3 MG/0.5ML SOPN; Inject 3 mg as directed once a week.  Dispense: 2 mL; Refill: 6  2. Lower abdominal pain Patient's UA is negative for UTI.  We discussed getting a pelvic ultrasound but patient wanted to hold off.  She states that if it persists or gets worse she will call back for me to schedule the ultrasound. Declines self swab for STI screening - POCT URINALYSIS DIP (CLINITEK)  3. Hyperlipidemia associated with type 2 diabetes mellitus (HCC) Continue atorvastatin.  Last LDL was 87 which is close to goal.  4. Tobacco dependence Strongly advised to quit.  Patient in the contemplative phase.  She is aware of health risks associated with smoking.  5. Screening for colon cancer Patient decided to have colonoscopy.  Referral submitted - Ambulatory referral to  Gastroenterology     Patient was given the opportunity to ask questions.  Patient verbalized understanding of the plan and was able to repeat key elements of the plan.   This documentation was completed using Paediatric nurse.  Any transcriptional errors are unintentional.  Orders Placed This Encounter  Procedures   POCT glucose (manual entry)   POCT glycosylated hemoglobin (Hb A1C)     Requested Prescriptions    No prescriptions requested or ordered in this encounter    No follow-ups on file.  Jonah Blue, MD, FACP

## 2022-01-23 NOTE — Patient Instructions (Signed)
Increase Trulicity to 3 mg once a week.

## 2022-01-25 ENCOUNTER — Ambulatory Visit: Payer: BC Managed Care – PPO | Admitting: Internal Medicine

## 2022-01-31 ENCOUNTER — Encounter: Payer: Self-pay | Admitting: *Deleted

## 2022-02-05 ENCOUNTER — Ambulatory Visit: Payer: BC Managed Care – PPO | Admitting: Nutrition

## 2022-04-19 ENCOUNTER — Other Ambulatory Visit: Payer: Self-pay | Admitting: Internal Medicine

## 2022-04-30 ENCOUNTER — Ambulatory Visit
Admission: RE | Admit: 2022-04-30 | Discharge: 2022-04-30 | Disposition: A | Discharge status: Home / Self Care | Payer: BC Managed Care – PPO | Source: Ambulatory Visit | Attending: Nurse Practitioner | Admitting: Nurse Practitioner

## 2022-04-30 ENCOUNTER — Ambulatory Visit: Payer: Self-pay

## 2022-04-30 VITALS — BP 121/82 | HR 100 | Temp 99.1°F | Resp 16

## 2022-04-30 DIAGNOSIS — R1030 Lower abdominal pain, unspecified: Secondary | ICD-10-CM | POA: Insufficient documentation

## 2022-04-30 DIAGNOSIS — R350 Frequency of micturition: Secondary | ICD-10-CM | POA: Diagnosis not present

## 2022-04-30 DIAGNOSIS — N760 Acute vaginitis: Secondary | ICD-10-CM | POA: Diagnosis not present

## 2022-04-30 DIAGNOSIS — N3000 Acute cystitis without hematuria: Secondary | ICD-10-CM | POA: Insufficient documentation

## 2022-04-30 LAB — POCT URINALYSIS DIP (MANUAL ENTRY)
Bilirubin, UA: NEGATIVE
Glucose, UA: NEGATIVE mg/dL
Ketones, POC UA: NEGATIVE mg/dL
Nitrite, UA: NEGATIVE
Protein Ur, POC: NEGATIVE mg/dL
Spec Grav, UA: 1.01 (ref 1.010–1.025)
Urobilinogen, UA: 0.2 E.U./dL
pH, UA: 5.5 (ref 5.0–8.0)

## 2022-04-30 MED ORDER — NITROFURANTOIN MONOHYD MACRO 100 MG PO CAPS: 100.0000 mg | ORAL_CAPSULE | Freq: Two times a day (BID) | ORAL | 0 refills | Status: AC

## 2022-04-30 MED ORDER — PHENAZOPYRIDINE HCL 100 MG PO TABS: 100.0000 mg | ORAL_TABLET | Freq: Three times a day (TID) | ORAL | 0 refills | Status: DC | PRN

## 2022-04-30 NOTE — Telephone Encounter (Signed)
Patient was seen at [facility] today.

## 2022-04-30 NOTE — Discharge Instructions (Addendum)
-   The urinalysis today is consistent with a urinary tract infection.  Please on the Macrobid and take it twice daily for 5 days.  We will call you in a couple of days if the urine culture shows we need to change antibiotics. -To help with your symptoms in the meantime, you can start on the Pyridium up to every 8 hours as needed for bladder pain.  This will diarrhea urine bright orange, so do not be alone. -We will call you with any positive results from the vaginal swab that you did today and will prescribe treatment when the results come back.

## 2022-04-30 NOTE — ED Provider Notes (Signed)
[facility]    CSN: [id number] Arrival date & time: 04/30/22  1435      History   Chief Complaint Chief Complaint  Patient presents with   Appointment    1500 UTI sx for 10 days. - Entered by patient   Urinary Frequency    HPI Mariah [name] is a 46 y.o. female.   Patient presents with roughly 10 days of burning with urination, increased urinary frequency and urgency, voiding smaller amounts, suprapubic pressure, and vaginal discharge that has started the past couple of days.  She reports the vaginal discharge is thin and smells a little bit fishy.  No recent sexual intercourse, and she is not concerned about STI today.  No vaginal sores or lesions.  She denies new urinary incontinence, foul odor of her urine, hematuria, abdominal pain, back pain, flank pain, fever, and nausea/vomiting.  She has tried drinking more water without much relief of symptoms.  Patient denies antibiotic use in the past 90 days.    Past Medical History:  Diagnosis Date   Abnormal Pap smear 09/26/10   ASCUS   Abnormal uterine bleeding    Asthma    Albuterol Inhaler last used 7/12   Diabetes (HCC)    Diabetes mellitus without complication (HCC)    Hypercholesteremia    Morbid obesity (HCC)    Simple endometrial hyperplasia without atypia     Patient Active Problem List   Diagnosis Date Noted   Hyperlipidemia 06/03/2017   DM (diabetes mellitus), type 2, uncontrolled 06/03/2017   Class 3 obesity (HCC) 06/03/2017   Tobacco use disorder 06/03/2017   History of simple endometrial hyperplasia without atypia 03/15/2011    Past Surgical History:  Procedure Laterality Date   APPENDECTOMY     CYSTECTOMY  1989   ENDOMETRIAL BIOPSY  02/13/11   AUB   HYSTEROSCOPY WITH D & C  04/16/2011   Procedure: DILATATION AND CURETTAGE (D&C) /HYSTEROSCOPY;  Surgeon: Tereso Newcomer, MD;  Location: [facility];  Service: Gynecology;  Laterality: N/A;  Diagnostic   polyp removal  2010    OB History      Gravida  0   Para  0   Term  0   Preterm  0   AB  0   Living  0      SAB  0   IAB  0   Ectopic  0   Multiple  0   Live Births  0            Home Medications    Prior to Admission medications   Medication Sig Start Date End Date Taking? Authorizing Provider  nitrofurantoin, macrocrystal-monohydrate, (MACROBID) 100 MG capsule Take 1 capsule (100 mg total) by mouth 2 (two) times daily for 5 days. 04/30/22 05/05/22 Yes Valentino Nose, NP  phenazopyridine (PYRIDIUM) 100 MG tablet Take 1 tablet (100 mg total) by mouth 3 (three) times daily as needed for pain (bladder pain). 04/30/22  Yes Valentino Nose, NP  atorvastatin (LIPITOR) 80 MG tablet Take 1 tablet (80 mg total) by mouth daily. 11/24/21   Marcine Matar, MD  Dulaglutide (TRULICITY) 3 MG/0.5ML SOPN Inject 3 mg as directed once a week. 01/23/22   Marcine Matar, MD  glipiZIDE (GLUCOTROL) 10 MG tablet TAKE 1 TABLET BY MOUTH TWICE DAILY BEFORE A MEAL 04/19/22   Marcine Matar, MD  lisinopril (ZESTRIL) 2.5 MG tablet Take 1 tablet by mouth once daily 04/19/22   Marcine Matar, MD  megestrol (MEGACE) 40 MG tablet TAKE 1 TABLET BY MOUTH ONCE DAILY. CAN INCREASE TO 2 TABLETS EVERY DAY IN THE EVENT OF HEAVY BLEEDING 01/10/21   Anyanwu, Jethro Bastos, MD  metFORMIN (GLUCOPHAGE) 1000 MG tablet TAKE 1 TABLET BY MOUTH TWICE DAILY WITH MEALS 04/19/22   Marcine Matar, MD  Multiple Vitamin (MULTIVITAMIN WITH MINERALS) TABS tablet Take 1 tablet by mouth daily.    [provider]  Multiple Vitamins-Minerals (AIRBORNE PO) Take by mouth.    [provider]  nicotine (NICODERM CQ - DOSED IN MG/24 HOURS) 21 mg/24hr patch Place 1 patch (21 mg total) onto the skin daily. Patient not taking: Reported on 06/29/2021 05/19/21   Marcine Matar, MD  promethazine-dextromethorphan (PROMETHAZINE-DM) 6.25-15 MG/5ML syrup Take 5 mLs by mouth 4 (four) times daily as needed for cough. Patient not taking: Reported  on 06/29/2021 04/16/21   Bing Neighbors, FNP    Family History Family History  Problem Relation Age of Onset   Diabetes Mother    Heart disease Mother    Hyperlipidemia Mother    Diabetes Maternal Grandmother    Diabetes Maternal Grandfather    Cancer Paternal Grandmother    Breast cancer Paternal Grandmother     Social History Social History   Tobacco Use   Smoking status: Every Day    Packs/day: 1.00    Years: 14.00    Total pack years: 14.00    Types: Cigarettes   Smokeless tobacco: Never  Vaping Use   Vaping Use: Never used  Substance Use Topics   Alcohol use: Yes    Comment: occassonially   Drug use: No     Allergies   Farxiga [dapagliflozin]   Review of Systems Review of Systems Per HPI  Physical Exam Triage Vital Signs ED Triage Vitals  Enc Vitals Group     BP 04/30/22 1442 121/82     Pulse Rate 04/30/22 1442 100     Resp 04/30/22 1442 16     Temp 04/30/22 1442 99.1 F (37.3 C)     Temp Source 04/30/22 1442 Oral     SpO2 04/30/22 1442 96 %     Weight --      Height --      Head Circumference --      Peak Flow --      Pain Score 04/30/22 1444 5     Pain Loc --      Pain Edu? --      Excl. in GC? --    No data found.  Updated Vital Signs BP 121/82 (BP Location: Right Arm)   Pulse 100   Temp 99.1 F (37.3 C) (Oral)   Resp 16   SpO2 96%   Visual Acuity Right Eye Distance:   Left Eye Distance:   Bilateral Distance:    Right Eye Near:   Left Eye Near:    Bilateral Near:     Physical Exam Vitals and nursing note reviewed.  Constitutional:      General: She is not in acute distress.    Appearance: Normal appearance. She is not toxic-appearing.  HENT:     Mouth/Throat:     Mouth: Mucous membranes are moist.     Pharynx: Oropharynx is clear.  Pulmonary:     Effort: Pulmonary effort is normal. No respiratory distress.  Abdominal:     General: Abdomen is flat. Bowel sounds are normal. There is no distension.     Palpations:  Abdomen is soft. There  is no mass.     Tenderness: There is no abdominal tenderness. There is no right CVA tenderness, left CVA tenderness or guarding.  Genitourinary:    Comments: Deferred-self swab obtained Skin:    General: Skin is warm and dry.     Coloration: Skin is not jaundiced or pale.     Findings: No erythema.  Neurological:     Mental Status: She is alert and oriented to person, place, and time.  Psychiatric:        Behavior: Behavior is cooperative.      UC Treatments / Results  Labs (all labs ordered are listed, but only abnormal results are displayed) Labs Reviewed  POCT URINALYSIS DIP (MANUAL ENTRY) - Abnormal; Notable for the following components:      Result Value   Clarity, UA hazy (*)    Blood, UA trace-intact (*)    Leukocytes, UA Moderate (2+) (*)    All other components within normal limits  URINE CULTURE  CERVICOVAGINAL ANCILLARY ONLY    EKG   Radiology No results found.  Procedures Procedures (including critical care time)  Medications Ordered in UC Medications - No data to display  Initial Impression / Assessment and Plan / UC Course  I have reviewed the triage vital signs and the nursing notes.  Pertinent labs & imaging results that were available during my care of the patient were reviewed by me and considered in my medical decision making (see chart for details).    Patient is a very pleasant, well-appearing 46 year old female.  In triage, she is normotensive, not tachycardic, afebrile, not tachypneic, and oxygenating well on room air.  Urinalysis today is hazy, shows trace blood, 2+ leukocytes.  Given dysuria, will treat empirically with Macrobid while urine culture is pending.  Also start Pyridium 100 mg every 8 hours as needed for vaginal pain.  Vaginal self swab obtained to check for yeast vaginitis and bacterial vaginosis.  She is not concerned about STI today and therefore declines HIV and syphilis as well as gonorrhea, chlamydia,  trichomonas testing.  ER precautions discussed.  The patient was given the opportunity to ask questions.  All questions answered to their satisfaction.  The patient is in agreement to this plan.  Final Clinical Impressions(s) / UC Diagnoses   Final diagnoses:  Urinary frequency  Lower abdominal pain  Acute vaginitis  Acute cystitis without hematuria     Discharge Instructions      - The urinalysis today is consistent with a urinary tract infection.  Please on the Macrobid and take it twice daily for 5 days.  We will call you in a couple of days if the urine culture shows we need to change antibiotics. -To help with your symptoms in the meantime, you can start on the Pyridium up to every 8 hours as needed for bladder pain.  This will diarrhea urine bright orange, so do not be alone. -We will call you with any positive results from the vaginal swab that you did today and will prescribe treatment when the results come back.   ED Prescriptions     Medication Sig Dispense Auth. Provider   nitrofurantoin, macrocrystal-monohydrate, (MACROBID) 100 MG capsule Take 1 capsule (100 mg total) by mouth 2 (two) times daily for 5 days. 10 capsule Cathlean Marseilles A, NP   phenazopyridine (PYRIDIUM) 100 MG tablet Take 1 tablet (100 mg total) by mouth 3 (three) times daily as needed for pain (bladder pain). 10 tablet Valentino Nose, NP  PDMP not reviewed this encounter.   Valentino Nose, NP 04/30/22 1524

## 2022-04-30 NOTE — ED Triage Notes (Signed)
Pt reports urinary frequency and lower abdominal discomfort x 10 days.

## 2022-04-30 NOTE — Telephone Encounter (Signed)
  Chief Complaint: UTI Symptoms: urinary frequency, urgency, dysuria, lower abdominal pain, cloudy urine Frequency: 10 days Pertinent Negatives: NA Disposition: [] ED /[x] Urgent Care (no appt availability in office) / [] Appointment(In office/virtual)/ []  [place] [facility]/ [] Home Care/ [] Refused Recommended Disposition /[] [place] [facility]/ []  Follow-up with PCP Additional Notes: advised pt of no appts available at practice until 05/15/22. Offered UC, pt agreed and wanted to be seen as soon as possible. Scheduled UC appt for today at 1500.   Summary: urinary discomfort / rx req   The patient has experienced urinary discomfort for roughly 10 days   The patient shares that they have tried to hydrate with water but it is ineffective   The patient has experienced cloudy urine and slight discharge and discomfort after urination   The patient would like to speak with a member of clinical staff   The patient would like to be prescribed something for their discomfort      Reason for Disposition  Urinating more frequently than usual (i.e., frequency)  Answer Assessment - Initial Assessment Questions 1. SYMPTOM: "What's the main symptom you're concerned about?" (e.g., frequency, incontinence)     Dysuria 2. ONSET: "When did the  sx  start?"     10 days  3. PAIN: "Is there any pain?" If Yes, ask: "How bad is it?" (Scale: 1-10; mild, moderate, severe)     Lower abdominal pain  4. CAUSE: "What do you think is causing the symptoms?"     Possible UTI  5. OTHER SYMPTOMS: "Do you have any other symptoms?" (e.g., blood in urine, fever, flank pain, pain with urination)     Cloudy urine, dysuria, frequency, urgency.  Protocols used: Urinary Symptoms-A-AH

## 2022-05-01 LAB — CERVICOVAGINAL ANCILLARY ONLY
Bacterial Vaginitis (gardnerella): NEGATIVE
Candida Glabrata: NEGATIVE
Candida Vaginitis: NEGATIVE
Comment: NEGATIVE
Comment: NEGATIVE
Comment: NEGATIVE

## 2022-05-02 LAB — URINE CULTURE: Culture: >=100000 — AB

## 2022-05-23 ENCOUNTER — Other Ambulatory Visit: Payer: Self-pay | Admitting: Internal Medicine

## 2022-05-23 MED ORDER — GLIPIZIDE 10 MG PO TABS: 10.0000 mg | ORAL_TABLET | Freq: Two times a day (BID) | ORAL | 1 refills | Status: DC

## 2022-05-23 MED ORDER — METFORMIN HCL 1000 MG PO TABS: 1000.0000 mg | ORAL_TABLET | Freq: Two times a day (BID) | ORAL | 1 refills | Status: DC

## 2022-05-23 MED ORDER — LISINOPRIL 2.5 MG PO TABS: 2.5000 mg | ORAL_TABLET | Freq: Every day | ORAL | 1 refills | Status: DC

## 2022-05-23 MED ORDER — ATORVASTATIN CALCIUM 80 MG PO TABS: 80.0000 mg | ORAL_TABLET | Freq: Every day | ORAL | 1 refills | Status: DC

## 2022-05-23 NOTE — Telephone Encounter (Signed)
Patient called  and advised the reason for the 30 day supply sent on 04/19/22 was that she needed an OV for additional refills. She had one on 05/29/22 with Dr. Wynetta Emery, [place] so the earliest was on 07/04/22 with Freeman Caldron. Patient is scheduled on 07/04/22 and would like medication refilled. Advised they can be refilled until her appointment and she must come in for that appointment for additional refills, she verbalized understanding.

## 2022-05-23 NOTE — Telephone Encounter (Signed)
Requested medication (s) are due for refill today:Yes  Requested medication (s) are on the active medication list: Yes  Last refill:  04/19/22  Future visit scheduled: Yes  Notes to clinic:  Unable to refill per protocol due to failed labs, no updated results for refill of Lisinopril. Patient would like a refill until her upcoming appt 07/04/22.     Requested Prescriptions  Pending Prescriptions Disp Refills   lisinopril (ZESTRIL) 2.5 MG tablet 30 tablet 0    Sig: Take 1 tablet (2.5 mg total) by mouth daily.     Cardiovascular:  ACE Inhibitors Failed - 05/23/2022  3:49 PM      Failed - Cr in normal range and within 180 days    Creat  Date Value Ref Range Status  08/29/2020 0.78 0.50 - 1.10 mg/dL Final   Creatinine, Ser  Date Value Ref Range Status  05/19/2021 0.80 0.57 - 1.00 mg/dL Final   Creatinine, Urine  Date Value Ref Range Status  08/29/2020 391 (H) 20 - 275 mg/dL Final    Comment:    Verified by repeat analysis. .          Failed - K in normal range and within 180 days    Potassium  Date Value Ref Range Status  05/19/2021 4.7 3.5 - 5.2 mmol/L Final         Passed - Patient is not pregnant      Passed - Last BP in normal range    BP Readings from Last 1 Encounters:  04/30/22 121/82         Passed - Valid encounter within last 6 months    Recent Outpatient Visits           4 months ago Type 2 diabetes mellitus with obesity ([place])   [place] Karle Plumber B, MD   8 months ago Type 2 diabetes mellitus with obesity [facility])   [place] Karle Plumber B, MD   10 months ago Type 2 diabetes mellitus with obesity [facility])   [place], Annie Main L, RPH-CPP   11 months ago Type 2 diabetes mellitus with obesity [facility])   [place], RPH-CPP   1 year ago Establishing care with new doctor, encounter for    [place], MD       Future Appointments             In 1 month [place], Dionne Bucy, PA-C [place]             atorvastatin (LIPITOR) 80 MG tablet 90 tablet 1    Sig: Take 1 tablet (80 mg total) by mouth daily.     Cardiovascular:  Antilipid - Statins Failed - 05/23/2022  3:49 PM      Failed - Lipid Panel in normal range within the last 12 months    Cholesterol, Total  Date Value Ref Range Status  09/22/2021 129 100 - 199 mg/dL Final   LDL Cholesterol (Calc)  Date Value Ref Range Status  08/29/2020 96 mg/dL (calc) Final    Comment:    Reference range: <100 . Desirable range <100 mg/dL for primary prevention;   <70 mg/dL for patients with CHD or diabetic patients  with > or = 2 CHD risk factors. Marland Kitchen LDL-C is now calculated using the Martin-Hopkins  calculation,  which is a validated novel method providing  better accuracy than the Friedewald equation in the  estimation of LDL-C.  Cresenciano Genre et al. Annamaria Helling. 2119;417(40): 2061-2068  (http://education.[employer].com/faq/FAQ164)    LDL Chol Calc (NIH)  Date Value Ref Range Status  09/22/2021 87 0 - 99 mg/dL Final   HDL  Date Value Ref Range Status  09/22/2021 29 (L) >39 mg/dL Final   Triglycerides  Date Value Ref Range Status  09/22/2021 58 0 - 149 mg/dL Final         Passed - Patient is not pregnant      Passed - Valid encounter within last 12 months    Recent Outpatient Visits           4 months ago Type 2 diabetes mellitus with obesity ([place])   [place] Kened[place], Neoma Laming B, MD   8 months ago Type 2 diabetes mellitus with obesity ([place])   [place] Ladell Pier, MD   10 months ago Type 2 diabetes mellitus with obesity [facility])   [place], Jarome Matin, RPH-CPP   11 months ago Type 2 diabetes mellitus with obesity [facility])    [place], [place], RPH-CPP   1 year ago Establishing care with new doctor, encounter for   [place], MD       Future Appointments             In 1 month [place], Dionne Bucy, [place]             glipiZIDE (GLUCOTROL) 10 MG tablet 60 tablet 0    Sig: Take 1 tablet (10 mg total) by mouth 2 (two) times daily before a meal.     Endocrinology:  Diabetes - Sulfonylureas Failed - 05/23/2022  3:49 PM      Failed - HBA1C is between 0 and 7.9 and within 180 days    Hgb A1C (fingerstick)  Date Value Ref Range Status  01/12/2019 >14.0 (H) <6.0 % OF TOTAL HGB Final   HbA1c, POC (controlled diabetic range)  Date Value Ref Range Status  01/23/2022 8.7 (A) 0.0 - 7.0 % Final         Failed - Cr in normal range and within 360 days    Creat  Date Value Ref Range Status  08/29/2020 0.78 0.50 - 1.10 mg/dL Final   Creatinine, Ser  Date Value Ref Range Status  05/19/2021 0.80 0.57 - 1.00 mg/dL Final   Creatinine, Urine  Date Value Ref Range Status  08/29/2020 391 (H) 20 - 275 mg/dL Final    Comment:    Verified by repeat analysis. Renella Cunas - Valid encounter within last 6 months    Recent Outpatient Visits           4 months ago Type 2 diabetes mellitus with obesity [facility])   [place] Karle Plumber B, MD   8 months ago Type 2 diabetes mellitus with obesity [facility])   [place] Karle Plumber B, MD   10 months ago Type 2 diabetes mellitus with obesity [facility])   [place], Annie Main L, RPH-CPP   11 months ago Type 2 diabetes mellitus with obesity ([place])   [facility]  [place], RPH-CPP   1 year ago Establishing care with new doctor, encounter for   [place], MD       Future Appointments             In 1 month [place], Dionne Bucy, [place]             metFORMIN (GLUCOPHAGE) 1000 MG tablet 60 tablet 0    Sig: Take 1 tablet (1,000 mg total) by mouth 2 (two) times daily with a meal.     Endocrinology:  Diabetes - Biguanides Failed - 05/23/2022  3:49 PM      Failed - Cr in normal range and within 360 days    Creat  Date Value Ref Range Status  08/29/2020 0.78 0.50 - 1.10 mg/dL Final   Creatinine, Ser  Date Value Ref Range Status  05/19/2021 0.80 0.57 - 1.00 mg/dL Final   Creatinine, Urine  Date Value Ref Range Status  08/29/2020 391 (H) 20 - 275 mg/dL Final    Comment:    Verified by repeat analysis. .          Failed - HBA1C is between 0 and 7.9 and within 180 days    Hgb A1C (fingerstick)  Date Value Ref Range Status  01/12/2019 >14.0 (H) <6.0 % OF TOTAL HGB Final   HbA1c, POC (controlled diabetic range)  Date Value Ref Range Status  01/23/2022 8.7 (A) 0.0 - 7.0 % Final         Failed - eGFR in normal range and within 360 days    eGFR  Date Value Ref Range Status  05/19/2021 93 >59 mL/min/1.73 Final         Failed - B12 Level in normal range and within 720 days    No results found for: "VITAMINB12"       Failed - CBC within normal limits and completed in the last 12 months    WBC  Date Value Ref Range Status  09/22/2021 9.4 3.4 - 10.8 x10E3/uL Final  08/29/2020 9.7 3.8 - 10.8 Thousand/uL Final   RBC  Date Value Ref Range Status  09/22/2021 5.03 3.77 - 5.28 x10E6/uL Final  08/29/2020 5.13 (H) 3.80 - 5.10 Million/uL Final   Hemoglobin  Date Value Ref Range Status  09/22/2021 13.2 11.1 - 15.9 g/dL Final   Hematocrit  Date Value Ref Range Status  09/22/2021 39.9 34.0 - 46.6 % Final   MCHC  Date Value Ref Range Status  09/22/2021 33.1 31.5 - 35.7 g/dL Final  08/29/2020 33.0 32.0 - 36.0 g/dL Final   [facility]  Date Value Ref Range Status  09/22/2021  26.2 (L) 26.6 - 33.0 pg Final  08/29/2020 26.9 (L) 27.0 - 33.0 pg Final   MCV  Date Value Ref Range Status  09/22/2021 79 79 - 97 fL Final   No results found for: "PLTCOUNTKUC", "LABPLAT", "POCPLA" RDW  Date Value Ref Range Status  09/22/2021 13.5 11.7 - 15.4 % Final         Passed - Valid encounter within last 6 months    Recent Outpatient Visits           4 months ago Type 2 diabetes mellitus with obesity ([place])   [place] Karle Plumber B, MD   8 months ago Type 2 diabetes mellitus with obesity [facility])   [place]  [place], MD   10 months ago Type 2 diabetes mellitus with obesity [facility])   [place], Jarome Matin, RPH-CPP   11 months ago Type 2 diabetes mellitus with obesity [facility])   [place], RPH-CPP   1 year ago Establishing care with new doctor, encounter for   [place], MD       Future Appointments             In 1 month [place], Dionne Bucy, PA-C [place]               c

## 2022-05-23 NOTE — Telephone Encounter (Signed)
Copied from [place] [phone]. Topic: General - Other >> May 23, 2022  3:09 PM Mariah [name] wrote: Reason for CRM: The patient would like to discuss the quantities of their recently refilled medications atorvastatin (LIPITOR) 80 MG tablet [[id number]] metFORMIN (GLUCOPHAGE) 1000 MG tablet [[id number]] glipiZIDE (GLUCOTROL) 10 MG tablet [[id number]] lisinopril (ZESTRIL) 2.5 MG tablet [[id number]]   The patient shares that they have recently received refills in a 30 day quantity rather than their usual 90 day   The patient would like to discuss this further when possible

## 2022-05-29 ENCOUNTER — Ambulatory Visit: Payer: BC Managed Care – PPO | Admitting: Internal Medicine

## 2022-07-04 ENCOUNTER — Ambulatory Visit: Payer: BC Managed Care – PPO | Attending: Internal Medicine | Admitting: Physician Assistant

## 2022-07-04 ENCOUNTER — Encounter: Payer: Self-pay | Admitting: Physician Assistant

## 2022-07-04 VITALS — BP 116/78 | HR 97 | Ht 68.0 in | Wt 238.0 lb

## 2022-07-04 DIAGNOSIS — E669 Obesity, unspecified: Secondary | ICD-10-CM

## 2022-07-04 DIAGNOSIS — E785 Hyperlipidemia, unspecified: Secondary | ICD-10-CM

## 2022-07-04 DIAGNOSIS — E1169 Type 2 diabetes mellitus with other specified complication: Secondary | ICD-10-CM | POA: Diagnosis not present

## 2022-07-04 LAB — POCT GLYCOSYLATED HEMOGLOBIN (HGB A1C): HbA1c, POC (controlled diabetic range): 7.5 % — AB (ref 0.0–7.0)

## 2022-07-04 MED ORDER — ATORVASTATIN CALCIUM 80 MG PO TABS: 80.0000 mg | ORAL_TABLET | Freq: Every day | ORAL | 1 refills | Status: DC

## 2022-07-04 MED ORDER — LISINOPRIL 2.5 MG PO TABS: 2.5000 mg | ORAL_TABLET | Freq: Every day | ORAL | 1 refills | Status: DC

## 2022-07-04 MED ORDER — METFORMIN HCL 1000 MG PO TABS: 1000.0000 mg | ORAL_TABLET | Freq: Two times a day (BID) | ORAL | 1 refills | Status: DC

## 2022-07-04 MED ORDER — TRULICITY 3 MG/0.5ML ~~LOC~~ SOAJ: 3.0000 mg | SUBCUTANEOUS | 6 refills | Status: DC

## 2022-07-04 MED ORDER — GLIPIZIDE 10 MG PO TABS: 10.0000 mg | ORAL_TABLET | Freq: Two times a day (BID) | ORAL | 1 refills | Status: DC

## 2022-07-04 NOTE — Progress Notes (Signed)
Patient ID: Mariah [name], female   DOB: 1976/05/19, 46 y.o.   MRN: [id number]     Mariah [name], is a 46 y.o. female  [id number]  [id number]  DOB - [birth year]  Chief Complaint  Patient presents with   Diabetes   Medication Refill       Subjective:   Mariah [name] is a 46 y.o. female here today for med RF.  No new issues or concerns.  Compliant with meds.  Only checks blood sugars fasting and usu gets 80-140.  No dizziness/polydipsia/polyuria  No problems updated.  ALLERGIES: Allergies  Allergen Reactions   Farxiga [Dapagliflozin] Itching    PAST MEDICAL HISTORY: Past Medical History:  Diagnosis Date   Abnormal Pap smear 09/26/10   ASCUS   Abnormal uterine bleeding    Asthma    Albuterol Inhaler last used 7/12   Diabetes (HCC)    Diabetes mellitus without complication (HCC)    Hypercholesteremia    Morbid obesity (HCC)    Simple endometrial hyperplasia without atypia     MEDICATIONS AT HOME: Prior to Admission medications   Medication Sig Start Date End Date Taking? Authorizing Provider  megestrol (MEGACE) 40 MG tablet TAKE 1 TABLET BY MOUTH ONCE DAILY. CAN INCREASE TO 2 TABLETS EVERY DAY IN THE EVENT OF HEAVY BLEEDING 01/10/21  Yes Anyanwu, Jethro Bastos, MD  Multiple Vitamin (MULTIVITAMIN WITH MINERALS) TABS tablet Take 1 tablet by mouth daily.   Yes [provider]  Multiple Vitamins-Minerals (AIRBORNE PO) Take by mouth.   Yes [provider]  nicotine (NICODERM CQ - DOSED IN MG/24 HOURS) 21 mg/24hr patch Place 1 patch (21 mg total) onto the skin daily. 05/19/21  Yes Marcine Matar, MD  atorvastatin (LIPITOR) 80 MG tablet Take 1 tablet (80 mg total) by mouth daily. 07/04/22   Anders Simmonds, PA-C  Dulaglutide (TRULICITY) 3 MG/0.5ML SOPN Inject 3 mg as directed once a week. 07/04/22   Anders Simmonds, PA-C  glipiZIDE (GLUCOTROL) 10 MG tablet Take 1 tablet (10 mg total) by mouth 2 (two) times daily before a meal. 07/04/22   Mieke Brinley,  Marzella Schlein, PA-C  lisinopril (ZESTRIL) 2.5 MG tablet Take 1 tablet (2.5 mg total) by mouth daily. 07/04/22   Anders Simmonds, PA-C  metFORMIN (GLUCOPHAGE) 1000 MG tablet Take 1 tablet (1,000 mg total) by mouth 2 (two) times daily with a meal. 07/04/22   Tamanika Heiney, Marzella Schlein, PA-C    ROS: Neg HEENT Neg resp Neg cardiac Neg GI Neg GU Neg MS Neg psych Neg neuro  Objective:   Vitals:   07/04/22 1535  BP: 116/78  Pulse: 97  SpO2: 100%  Weight: 238 lb (108 kg)  Height: 5\' 8"  (1.727 m)   Exam General appearance : Awake, alert, not in any distress. Speech Clear. Not toxic looking HEENT: Atraumatic and Normocephalic Neck: Supple, no JVD. No cervical lymphadenopathy.  Chest: Good air entry bilaterally, CTAB.  No rales/rhonchi/wheezing CVS: S1 S2 regular, no murmurs.  Extremities: B/L Lower Ext shows no edema, both legs are warm to touch Neurology: Awake alert, and oriented X 3, CN II-XII intact, Non focal Skin: No Rash  Data Review Lab Results  Component Value Date   HGBA1C 7.5 (A) 07/04/2022   HGBA1C 8.7 (A) 01/23/2022   HGBA1C 9.4 (A) 09/22/2021    Assessment & Plan   1. Type 2 diabetes mellitus with obesity (HCC) Improved. She would like to proceed on current meds and work on diabetic diet - HgB A1c -  Comprehensive metabolic panel - Dulaglutide (TRULICITY) 3 WV/3.7TG SOPN; Inject 3 mg as directed once a week.  Dispense: 2 mL; Refill: 6 - metFORMIN (GLUCOPHAGE) 1000 MG tablet; Take 1 tablet (1,000 mg total) by mouth 2 (two) times daily with a meal.  Dispense: 180 tablet; Refill: 1 - lisinopril (ZESTRIL) 2.5 MG tablet; Take 1 tablet (2.5 mg total) by mouth daily.  Dispense: 90 tablet; Refill: 1 - glipiZIDE (GLUCOTROL) 10 MG tablet; Take 1 tablet (10 mg total) by mouth 2 (two) times daily before a meal.  Dispense: 180 tablet; Refill: 1 - Lipid panel  2. Hyperlipidemia associated with type 2 diabetes mellitus (HCC) - Comprehensive metabolic panel - atorvastatin (LIPITOR) 80  MG tablet; Take 1 tablet (80 mg total) by mouth daily.  Dispense: 90 tablet; Refill: 1 - Lipid panel    Return in about 3 months (around 10/04/2022) for PCP for chronic conditions.  The patient was given clear instructions to go to ER or return to medical center if symptoms don't improve, worsen or new problems develop. The patient verbalized understanding. The patient was told to call to get lab results if they haven't heard anything in the next week.      Freeman Caldron, PA-C [facility] [place], [place]   07/04/2022, 3:48 PM

## 2022-07-04 NOTE — Patient Instructions (Signed)
work at a goal of eliminating sugary drinks, candy, desserts, sweets, refined sugars, processed foods, and white carbohydrates.    Drink 80-100 ounces water daily

## 2022-07-05 ENCOUNTER — Other Ambulatory Visit: Payer: Self-pay | Admitting: Physician Assistant

## 2022-07-05 DIAGNOSIS — E1169 Type 2 diabetes mellitus with other specified complication: Secondary | ICD-10-CM

## 2022-07-05 LAB — LIPID PANEL
Chol/HDL Ratio: 3.6 ratio (ref 0.0–4.4)
Cholesterol, Total: 126 mg/dL (ref 100–199)
HDL: 35 mg/dL — ABNORMAL LOW (ref >39)
LDL Chol Calc (NIH): 78 mg/dL (ref 0–99)
Triglycerides: 60 mg/dL (ref 0–149)
VLDL Cholesterol Cal: 13 mg/dL (ref 5–40)

## 2022-07-05 LAB — COMPREHENSIVE METABOLIC PANEL
ALT: 8 IU/L (ref 0–32)
AST: 12 IU/L (ref 0–40)
Albumin/Globulin Ratio: 1.5 (ref 1.2–2.2)
Albumin: 4.6 g/dL (ref 3.9–4.9)
Alkaline Phosphatase: 95 IU/L (ref 44–121)
BUN/Creatinine Ratio: 13 (ref 9–23)
BUN: 13 mg/dL (ref 6–24)
Bilirubin Total: 0.2 mg/dL (ref 0.0–1.2)
CO2: 20 mmol/L (ref 20–29)
Calcium: 10.2 mg/dL (ref 8.7–10.2)
Chloride: 104 mmol/L (ref 96–106)
Creatinine, Ser: 1.03 mg/dL — ABNORMAL HIGH (ref 0.57–1.00)
Globulin, Total: 3 g/dL (ref 1.5–4.5)
Glucose: 67 mg/dL — ABNORMAL LOW (ref 70–99)
Potassium: 4.5 mmol/L (ref 3.5–5.2)
Sodium: 141 mmol/L (ref 134–144)
Total Protein: 7.6 g/dL (ref 6.0–8.5)
eGFR: 68 mL/min/{1.73_m2} (ref >59)

## 2022-07-05 MED ORDER — METFORMIN HCL 1000 MG PO TABS: 1000.0000 mg | ORAL_TABLET | Freq: Two times a day (BID) | ORAL | 1 refills | Status: DC

## 2022-07-05 MED ORDER — LISINOPRIL 2.5 MG PO TABS: 2.5000 mg | ORAL_TABLET | Freq: Every day | ORAL | 1 refills | Status: DC

## 2022-07-05 MED ORDER — TRULICITY 3 MG/0.5ML ~~LOC~~ SOAJ: 3.0000 mg | SUBCUTANEOUS | 6 refills | Status: DC

## 2022-07-05 MED ORDER — ATORVASTATIN CALCIUM 80 MG PO TABS: 80.0000 mg | ORAL_TABLET | Freq: Every day | ORAL | 1 refills | Status: DC

## 2022-07-05 MED ORDER — GLIPIZIDE 10 MG PO TABS: 10.0000 mg | ORAL_TABLET | Freq: Two times a day (BID) | ORAL | 1 refills | Status: DC

## 2022-07-05 NOTE — Telephone Encounter (Signed)
Pt seen yesterday and her prescriptions were sent to wrong pharmacy  atorvastatin (LIPITOR) 80 MG tablet  Dulaglutide (TRULICITY) 3 JH/4.1DE SOPN  glipiZIDE (GLUCOTROL) 10 MG tablet  metFORMIN (GLUCOPHAGE) 1000 MG tablet  lisinopril (ZESTRIL) 2.5 MG tablet   [place] mail services

## 2022-07-05 NOTE — Telephone Encounter (Signed)
Requested medications are due for refill today.  no  Requested medications are on the active medications list.  yes  Last refill. 07/05/2022 #180 1 rf  Future visit scheduled.   yes  Notes to clinic.  Pharmacy comment: Per Delman Kitten, please include collaborating doctor's information. Thank you!     Requested Prescriptions  Pending Prescriptions Disp Refills   glipiZIDE (GLUCOTROL) 10 MG tablet [Pharmacy Med Name: GLIPIZIDE 10MG       TAB] 180 tablet 1    Sig: TAKE 1 TABLET BY MOUTH TWICE DAILY BEFORE A MEAL     Endocrinology:  Diabetes - Sulfonylureas Failed - 07/05/2022  2:32 PM      Failed - Cr in normal range and within 360 days    Creat  Date Value Ref Range Status  08/29/2020 0.78 0.50 - 1.10 mg/dL Final   Creatinine, Ser  Date Value Ref Range Status  07/04/2022 1.03 (H) 0.57 - 1.00 mg/dL Final   Creatinine, Urine  Date Value Ref Range Status  08/29/2020 391 (H) 20 - 275 mg/dL Final    Comment:    Verified by repeat analysis. .          Passed - HBA1C is between 0 and 7.9 and within 180 days    Hgb A1C (fingerstick)  Date Value Ref Range Status  01/12/2019 >14.0 (H) <6.0 % OF TOTAL HGB Final   HbA1c, POC (controlled diabetic range)  Date Value Ref Range Status  07/04/2022 7.5 (A) 0.0 - 7.0 % Final         Passed - Valid encounter within last 6 months    Recent Outpatient Visits           Yesterday Type 2 diabetes mellitus with obesity [facility])   [place], [place], [place]   5 months ago Type 2 diabetes mellitus with obesity [facility])   [place] Karle Plumber B, MD   9 months ago Type 2 diabetes mellitus with obesity [facility])   [place] Karle Plumber B, MD   11 months ago Type 2 diabetes mellitus with obesity [facility])   [place], Jarome Matin, RPH-CPP   1 year ago Type 2 diabetes mellitus with obesity [facility])   [place], RPH-CPP       Future Appointments             In 3 months Wynetta Emery, Dalbert Batman, MD [place]

## 2022-07-05 NOTE — Telephone Encounter (Signed)
Sending to a new pharmacy as provider ordered on 07/04/22.  Requested Prescriptions  Pending Prescriptions Disp Refills   atorvastatin (LIPITOR) 80 MG tablet 90 tablet 1    Sig: Take 1 tablet (80 mg total) by mouth daily.     Cardiovascular:  Antilipid - Statins Failed - 07/05/2022  1:41 PM      Failed - Lipid Panel in normal range within the last 12 months    Cholesterol, Total  Date Value Ref Range Status  07/04/2022 126 100 - 199 mg/dL Final   LDL Cholesterol (Calc)  Date Value Ref Range Status  08/29/2020 96 mg/dL (calc) Final    Comment:    Reference range: <100 . Desirable range <100 mg/dL for primary prevention;   <70 mg/dL for patients with CHD or diabetic patients  with > or = 2 CHD risk factors. Marland Kitchen LDL-C is now calculated using the Martin-Hopkins  calculation, which is a validated novel method providing  better accuracy than the Friedewald equation in the  estimation of LDL-C.  Cresenciano Genre et al. Annamaria Helling. 4193;790(24): 2061-2068  (http://education.[employer].com/faq/FAQ164)    LDL Chol Calc (NIH)  Date Value Ref Range Status  07/04/2022 78 0 - 99 mg/dL Final   HDL  Date Value Ref Range Status  07/04/2022 35 (L) >39 mg/dL Final   Triglycerides  Date Value Ref Range Status  07/04/2022 60 0 - 149 mg/dL Final         Passed - Patient is not pregnant      Passed - Valid encounter within last 12 months    Recent Outpatient Visits           Yesterday Type 2 diabetes mellitus with [facility])   [place], [place], [place]   5 months ago Type 2 diabetes mellitus with obesity [facility])   [place] Karle Plumber B, MD   9 months ago Type 2 diabetes mellitus with obesity [facility])   [place] Karle Plumber B, MD   11 months ago Type 2 diabetes mellitus with obesity [facility])   [place], Jarome Matin, RPH-CPP   1 year ago  Type 2 diabetes mellitus with obesity [facility])   [place], RPH-CPP       Future Appointments             In 3 months Ladell Pier, MD [place]             Dulaglutide (TRULICITY) 3 OX/7.3ZH SOPN 2 mL 6    Sig: Inject 3 mg as directed once a week.     Endocrinology:  Diabetes - GLP-1 Receptor Agonists Passed - 07/05/2022  1:41 PM      Passed - HBA1C is between 0 and 7.9 and within 180 days    Hgb A1C (fingerstick)  Date Value Ref Range Status  01/12/2019 >14.0 (H) <6.0 % OF TOTAL HGB Final   HbA1c, POC (controlled diabetic range)  Date Value Ref Range Status  07/04/2022 7.5 (A) 0.0 - 7.0 % Final         Passed - Valid encounter within last 6 months    Recent Outpatient Visits           Yesterday Type 2 diabetes mellitus with obesity [facility])   [place], [place], [place]  5 months ago Type 2 diabetes mellitus with obesity ([place])   [place] Karle Plumber B, MD   9 months ago Type 2 diabetes mellitus with obesity [facility])   [place] Karle Plumber B, MD   11 months ago Type 2 diabetes mellitus with obesity [facility])   [place], Stephen L, RPH-CPP   1 year ago Type 2 diabetes mellitus with obesity [facility])   [place], Stephen L, RPH-CPP       Future Appointments             In 3 months Ladell Pier, MD [place]             glipiZIDE (GLUCOTROL) 10 MG tablet 180 tablet 1    Sig: Take 1 tablet (10 mg total) by mouth 2 (two) times daily before a meal.     Endocrinology:  Diabetes - Sulfonylureas Failed - 07/05/2022  1:41 PM      Failed - Cr in normal range and within 360 days    Creat  Date Value Ref Range Status  08/29/2020 0.78 0.50 - 1.10 mg/dL  Final   Creatinine, Ser  Date Value Ref Range Status  07/04/2022 1.03 (H) 0.57 - 1.00 mg/dL Final   Creatinine, Urine  Date Value Ref Range Status  08/29/2020 391 (H) 20 - 275 mg/dL Final    Comment:    Verified by repeat analysis. .          Passed - HBA1C is between 0 and 7.9 and within 180 days    Hgb A1C (fingerstick)  Date Value Ref Range Status  01/12/2019 >14.0 (H) <6.0 % OF TOTAL HGB Final   HbA1c, POC (controlled diabetic range)  Date Value Ref Range Status  07/04/2022 7.5 (A) 0.0 - 7.0 % Final         Passed - Valid encounter within last 6 months    Recent Outpatient Visits           Yesterday Type 2 diabetes mellitus with obesity [facility])   [place], [place], [place]   5 months ago Type 2 diabetes mellitus with obesity [facility])   [place] Karle Plumber B, MD   9 months ago Type 2 diabetes mellitus with obesity [facility])   [employer] Karle Plumber B, MD   11 months ago Type 2 diabetes mellitus with obesity [facility])   [place], Jarome Matin, RPH-CPP   1 year ago Type 2 diabetes mellitus with obesity [facility])   [place], RPH-CPP       Future Appointments             In 3 months Ladell Pier, MD [place]             lisinopril (ZESTRIL) 2.5 MG tablet 90 tablet 1    Sig: Take 1 tablet (2.5 mg total) by mouth daily.     Cardiovascular:  ACE Inhibitors Failed - 07/05/2022  1:41 PM      Failed - Cr in normal range and within 180 days    Creat  Date Value Ref Range Status  08/29/2020 0.78 0.50 - 1.10 mg/dL Final   Creatinine, Ser  Date Value Ref Range Status  07/04/2022 1.03 (H) 0.57 - 1.00 mg/dL Final   Creatinine, Urine  Date Value Ref Range Status  08/29/2020 391 (H) 20 - 275 mg/dL Final    Comment:    Verified by  repeat analysis. .          Passed - K in normal range and within 180 days    Potassium  Date Value Ref Range Status  07/04/2022 4.5 3.5 - 5.2 mmol/L Final         Passed - Patient is not pregnant      Passed - Last BP in normal range    BP Readings from Last 1 Encounters:  07/04/22 116/78         Passed - Valid encounter within last 6 months    Recent Outpatient Visits           Yesterday Type 2 diabetes mellitus with obesity [facility])   [place], [place], [place]   5 months ago Type 2 diabetes mellitus with obesity ([place])   [place] Karle Plumber B, MD   9 months ago Type 2 diabetes mellitus with obesity [facility])   [place] Karle Plumber B, MD   11 months ago Type 2 diabetes mellitus with obesity [facility])   [place], Jarome Matin, RPH-CPP   1 year ago Type 2 diabetes mellitus with obesity [facility])   [place], RPH-CPP       Future Appointments             In 3 months Ladell Pier, MD [place]             metFORMIN (GLUCOPHAGE) 1000 MG tablet 180 tablet 1    Sig: Take 1 tablet (1,000 mg total) by mouth 2 (two) times daily with a meal.     Endocrinology:  Diabetes - Biguanides Failed - 07/05/2022  1:41 PM      Failed - Cr in normal range and within 360 days    Creat  Date Value Ref Range Status  08/29/2020 0.78 0.50 - 1.10 mg/dL Final   Creatinine, Ser  Date Value Ref Range Status  07/04/2022 1.03 (H) 0.57 - 1.00 mg/dL Final   Creatinine, Urine  Date Value Ref Range Status  08/29/2020 391 (H) 20 - 275 mg/dL Final    Comment:    Verified by repeat analysis. .          Failed - B12 Level in normal range and within 720 days    No results found for: "VITAMINB12"       Failed - CBC within normal limits and completed in the  last 12 months    WBC  Date Value Ref Range Status  09/22/2021 9.4 3.4 - 10.8 x10E3/uL Final  08/29/2020 9.7 3.8 - 10.8 Thousand/uL Final   RBC  Date Value Ref Range Status  09/22/2021 5.03 3.77 - 5.28 x10E6/uL Final  08/29/2020 5.13 (H) 3.80 - 5.10 Million/uL Final   Hemoglobin  Date Value Ref Range Status  09/22/2021 13.2 11.1 - 15.9 g/dL Final   Hematocrit  Date Value Ref Range Status  09/22/2021 39.9 34.0 - 46.6 % Final   MCHC  Date Value Ref Range Status  09/22/2021 33.1 31.5 - 35.7 g/dL Final  08/29/2020 33.0 32.0 - 36.0 g/dL Final  [facility]  Date Value Ref Range Status  09/22/2021 26.2 (L) 26.6 - 33.0 pg Final  08/29/2020 26.9 (L) 27.0 - 33.0 pg Final   MCV  Date Value Ref Range Status  09/22/2021 79 79 - 97 fL Final   No results found for: "PLTCOUNTKUC", "LABPLAT", "POCPLA" RDW  Date Value Ref Range Status  09/22/2021 13.5 11.7 - 15.4 % Final         Passed - HBA1C is between 0 and 7.9 and within 180 days    Hgb A1C (fingerstick)  Date Value Ref Range Status  01/12/2019 >14.0 (H) <6.0 % OF TOTAL HGB Final   HbA1c, POC (controlled diabetic range)  Date Value Ref Range Status  07/04/2022 7.5 (A) 0.0 - 7.0 % Final         Passed - eGFR in normal range and within 360 days    eGFR  Date Value Ref Range Status  07/04/2022 68 >59 mL/min/1.73 Final         Passed - Valid encounter within last 6 months    Recent Outpatient Visits           Yesterday Type 2 diabetes mellitus with obesity [facility])   [place], [place], [place]   5 months ago Type 2 diabetes mellitus with obesity [facility])   [place] Karle Plumber B, MD   9 months ago Type 2 diabetes mellitus with obesity [facility])   [place] Karle Plumber B, MD   11 months ago Type 2 diabetes mellitus with obesity [facility])   [place], Jarome Matin, RPH-CPP   1 year ago  Type 2 diabetes mellitus with obesity [facility])   [place], RPH-CPP       Future Appointments             In 3 months Wynetta Emery, Dalbert Batman, MD [place]

## 2022-07-19 ENCOUNTER — Other Ambulatory Visit: Payer: Self-pay | Admitting: Obstetrics & Gynecology

## 2022-07-19 DIAGNOSIS — Z8742 Personal history of other diseases of the female genital tract: Secondary | ICD-10-CM

## 2022-07-25 ENCOUNTER — Encounter: Payer: Self-pay | Admitting: Family Medicine

## 2022-07-25 ENCOUNTER — Ambulatory Visit (INDEPENDENT_AMBULATORY_CARE_PROVIDER_SITE_OTHER): Payer: BC Managed Care – PPO | Admitting: Family Medicine

## 2022-07-25 VITALS — BP 122/70 | HR 87 | Ht 68.0 in | Wt 237.8 lb

## 2022-07-25 DIAGNOSIS — Z0000 Encounter for general adult medical examination without abnormal findings: Secondary | ICD-10-CM | POA: Insufficient documentation

## 2022-07-25 DIAGNOSIS — E119 Type 2 diabetes mellitus without complications: Secondary | ICD-10-CM | POA: Diagnosis not present

## 2022-07-25 DIAGNOSIS — F17200 Nicotine dependence, unspecified, uncomplicated: Secondary | ICD-10-CM | POA: Diagnosis not present

## 2022-07-25 DIAGNOSIS — Z716 Tobacco abuse counseling: Secondary | ICD-10-CM

## 2022-07-25 DIAGNOSIS — R011 Cardiac murmur, unspecified: Secondary | ICD-10-CM

## 2022-07-25 HISTORY — DX: Encounter for general adult medical examination without abnormal findings: Z00.00

## 2022-07-25 MED ORDER — NICOTINE 21 MG/24HR TD PT24: 21.0000 mg | MEDICATED_PATCH | Freq: Every day | TRANSDERMAL | 1 refills | Status: AC

## 2022-07-25 NOTE — Assessment & Plan Note (Signed)
New murmur noted on exam today. She denies chest pain, palpitations, shortness of breath, lightheadedness. Will obtain ECHO.

## 2022-07-25 NOTE — Assessment & Plan Note (Signed)
Discussed risks of smoking and importance of quitting. She is ready to quit. Previously ordered nicotine patches but never started them, would like to start them today. Disucssed use and will follow-up in February at her next visit.

## 2022-07-25 NOTE — Progress Notes (Signed)
New Patient Office Visit  Subjective    Patient ID: Mariah [name], female    DOB: Dec 16, 1975  Age: 46 y.o. MRN: [id number]  CC:  Chief Complaint  Patient presents with   Establish Care    HPI Mariah [name] presents to establish care. Oriented to practice routines and expectations. Has been seeing Dr Aundra Dubin at [facility], she had an OV last month for her Diabetes and had labs done. No changes since that visit.  DM: last A1c this month 7.5, she is taking Glipizide, Metformin, and Trulicity. We applied a CGM today so she can more closely monitor her BG levels throughout the day. She checks in the AM and does not have her readings available.  Tobacco Dependence: smokes 1ppd since her 73s, she is interested in quitting.  GYN: mammogram and PAP UTD    Outpatient Encounter Medications as of 07/25/2022  Medication Sig   atorvastatin (LIPITOR) 80 MG tablet Take 1 tablet (80 mg total) by mouth daily.   Dulaglutide (TRULICITY) 3 0000000 SOPN Inject 3 mg as directed once a week.   glipiZIDE (GLUCOTROL) 10 MG tablet Take 1 tablet (10 mg total) by mouth 2 (two) times daily before a meal.   lisinopril (ZESTRIL) 2.5 MG tablet Take 1 tablet (2.5 mg total) by mouth daily.   megestrol (MEGACE) 40 MG tablet TAKE 1 TABLET BY MOUTH ONCE DAILY. CAN INCREASE TO 2 TABLETS EVERY DAY IN THE EVENT OF HEAVY BLEEDING   metFORMIN (GLUCOPHAGE) 1000 MG tablet Take 1 tablet (1,000 mg total) by mouth 2 (two) times daily with a meal.   Multiple Vitamin (MULTIVITAMIN WITH MINERALS) TABS tablet Take 1 tablet by mouth daily.   Multiple Vitamins-Minerals (AIRBORNE PO) Take by mouth.   [DISCONTINUED] nicotine (NICODERM CQ - DOSED IN MG/24 HOURS) 21 mg/24hr patch Place 1 patch (21 mg total) onto the skin daily.   nicotine (NICODERM CQ - DOSED IN MG/24 HOURS) 21 mg/24hr patch Place 1 patch (21 mg total) onto the skin daily.   No facility-administered encounter medications on file as of 07/25/2022.     Past Medical History:  Diagnosis Date   Abnormal Pap smear 09/26/10   ASCUS   Abnormal uterine bleeding    Asthma    Albuterol Inhaler last used 7/12   Diabetes ([place])    Diabetes mellitus without complication ([place])    Hypercholesteremia    Morbid obesity ([place])    Routine adult health maintenance 07/25/2022   Simple endometrial hyperplasia without atypia     Past Surgical History:  Procedure Laterality Date   APPENDECTOMY     CYSTECTOMY  1989   ENDOMETRIAL BIOPSY  02/13/11   AUB   HYSTEROSCOPY WITH D & C  04/16/2011   Procedure: DILATATION AND CURETTAGE (D&C) /HYSTEROSCOPY;  Surgeon: Osborne Oman, MD;  Location: [facility];  Service: Gynecology;  Laterality: N/A;  Diagnostic   polyp removal  2010    Family History  Problem Relation Age of Onset   Diabetes Mother    Heart disease Mother    Hyperlipidemia Mother    Diabetes Maternal Grandmother    Diabetes Maternal Grandfather    Cancer Paternal Grandmother    Breast cancer Paternal Grandmother     Social History   Socioeconomic History   Marital status: Single    Spouse name: Not on file   Number of children: Not on file   Years of education: Not on file   Highest education level: Not on file  Occupational History   Not on file  Tobacco Use   Smoking status: Every Day    Packs/day: 1.00    Years: 14.00    Total pack years: 14.00    Types: Cigarettes   Smokeless tobacco: Never  Vaping Use   Vaping Use: Never used  Substance and Sexual Activity   Alcohol use: Yes    Comment: occassonially   Drug use: No   Sexual activity: Not Currently  Other Topics Concern   Not on file  Social History Narrative   Not on file   Social Determinants of Health   Financial Resource Strain: Not on file  Food Insecurity: Not on file  Transportation Needs: Not on file  Physical Activity: Not on file  Stress: Not on file  Social Connections: Not on file  Intimate Partner Violence: Not on file    Review of Systems   Constitutional: Negative.   HENT: Negative.    Eyes: Negative.   Respiratory: Negative.    Cardiovascular: Negative.   Gastrointestinal: Negative.   Genitourinary: Negative.   Musculoskeletal: Negative.   Skin: Negative.   Neurological: Negative.   Endo/Heme/Allergies: Negative.   Psychiatric/Behavioral: Negative.    All other systems reviewed and are negative.         Objective    BP 122/70   Pulse 87   Ht 5\' 8"  (1.727 m)   Wt 237 lb 12.8 oz (107.9 kg)   SpO2 99%   BMI 36.16 kg/m   Physical Exam Vitals and nursing note reviewed.  Constitutional:      Appearance: Normal appearance. She is obese.  HENT:     Head: Normocephalic and atraumatic.  Cardiovascular:     Rate and Rhythm: Normal rate and regular rhythm.     Pulses: Normal pulses.     Heart sounds: Murmur heard.  Pulmonary:     Effort: Pulmonary effort is normal.     Breath sounds: Normal breath sounds.  Skin:    General: Skin is warm and dry.  Neurological:     General: No focal deficit present.     Mental Status: She is alert and oriented to person, place, and time. Mental status is at baseline.  Psychiatric:        Mood and Affect: Mood normal.        Behavior: Behavior normal.        Thought Content: Thought content normal.        Judgment: Judgment normal.        Assessment & Plan:   Problem List Items Addressed This Visit       Endocrine   Diabetes mellitus without complication ([place])    Will recheck A1c in February. Continue medications as prescribed. Discussed importance of low carb diet. She would like dietary referral. Placed CGM today and educated on monitoring levels.      Relevant Orders   COMPLETE METABOLIC PANEL WITH GFR   Hemoglobin A1c   Amb ref to Medical Nutrition Therapy-MNT     Other   Class 3 obesity (HCC)    Encouraged calorie restricted diet, discussed heathy eating options and the importance of 150 minute of exercise weekly. Referral placed to dietician.       Relevant Orders   CBC with Differential/Platelet   Lipid panel   Amb ref to Medical Nutrition Therapy-MNT   Tobacco dependence    Discussed risks of smoking and importance of quitting. She is ready to quit. Previously ordered nicotine patches but never  started them, would like to start them today. Disucssed use and will follow-up in February at her next visit.      Relevant Medications   nicotine (NICODERM CQ - DOSED IN MG/24 HOURS) 21 mg/24hr patch   Murmur    New murmur noted on exam today. She denies chest pain, palpitations, shortness of breath, lightheadedness. Will obtain ECHO.      Relevant Orders   ECHOCARDIOGRAM COMPLETE   Routine adult health maintenance - Primary   Other Visit Diagnoses     Encounter for smoking cessation counseling           Return in about 2 months (around 10/04/2022) for labs.   Park Meo, FNP

## 2022-07-25 NOTE — Assessment & Plan Note (Signed)
Encouraged calorie restricted diet, discussed heathy eating options and the importance of 150 minute of exercise weekly. Referral placed to dietician.

## 2022-07-25 NOTE — Assessment & Plan Note (Signed)
Will recheck A1c in February. Continue medications as prescribed. Discussed importance of low carb diet. She would like dietary referral. Placed CGM today and educated on monitoring levels.

## 2022-08-17 ENCOUNTER — Other Ambulatory Visit: Payer: BC Managed Care – PPO

## 2022-08-22 ENCOUNTER — Encounter: Payer: Self-pay | Admitting: Family Medicine

## 2022-08-22 ENCOUNTER — Ambulatory Visit: Payer: BC Managed Care – PPO | Admitting: Family Medicine

## 2022-08-22 VITALS — BP 114/78 | HR 94 | Temp 98.3°F | Ht 68.0 in | Wt 233.0 lb

## 2022-08-22 DIAGNOSIS — Z1211 Encounter for screening for malignant neoplasm of colon: Secondary | ICD-10-CM

## 2022-08-22 DIAGNOSIS — J069 Acute upper respiratory infection, unspecified: Secondary | ICD-10-CM | POA: Diagnosis not present

## 2022-08-22 DIAGNOSIS — E119 Type 2 diabetes mellitus without complications: Secondary | ICD-10-CM

## 2022-08-22 NOTE — Assessment & Plan Note (Addendum)

## 2022-08-22 NOTE — Patient Instructions (Signed)

## 2022-08-22 NOTE — Progress Notes (Signed)
Acute Office Visit  Subjective:     Patient ID: Mariah [name], female    DOB: December 22, 1975, 46 y.o.   MRN: [id number]  Chief Complaint  Patient presents with   Acute Visit    MASK--Covid test negative throat pain and cough a few days now/slj    HPI Patient is in today for dry cough and sore throat in the AM since last week. Congestion is clearing up. Denies fever, shortness of breath, wheezing, runny nose and congestion. Negative home covid test No sick exposures. Has tried Dayquil and Nyquil.  Review of Systems  All other systems reviewed and are negative.  Past Medical History:  Diagnosis Date   Abnormal Pap smear 09/26/10   ASCUS   Abnormal uterine bleeding    Asthma    Albuterol Inhaler last used 7/12   Diabetes (HCC)    Diabetes mellitus without complication (HCC)    Hypercholesteremia    Morbid obesity (HCC)    Routine adult health maintenance 07/25/2022   Simple endometrial hyperplasia without atypia    Past Surgical History:  Procedure Laterality Date   APPENDECTOMY     CYSTECTOMY  1989   ENDOMETRIAL BIOPSY  02/13/11   AUB   HYSTEROSCOPY WITH D & C  04/16/2011   Procedure: DILATATION AND CURETTAGE (D&C) /HYSTEROSCOPY;  Surgeon: Tereso Newcomer, MD;  Location: [facility];  Service: Gynecology;  Laterality: N/A;  Diagnostic   polyp removal  2010   Current Outpatient Medications on File Prior to Visit  Medication Sig Dispense Refill   atorvastatin (LIPITOR) 80 MG tablet Take 1 tablet (80 mg total) by mouth daily. 90 tablet 1   Dulaglutide (TRULICITY) 3 MG/0.5ML SOPN Inject 3 mg as directed once a week. 2 mL 6   glipiZIDE (GLUCOTROL) 10 MG tablet Take 1 tablet (10 mg total) by mouth 2 (two) times daily before a meal. 180 tablet 1   lisinopril (ZESTRIL) 2.5 MG tablet Take 1 tablet (2.5 mg total) by mouth daily. 90 tablet 1   megestrol (MEGACE) 40 MG tablet TAKE 1 TABLET BY MOUTH ONCE DAILY. CAN INCREASE TO 2 TABLETS EVERY DAY IN THE EVENT OF HEAVY BLEEDING 90  tablet 0   metFORMIN (GLUCOPHAGE) 1000 MG tablet Take 1 tablet (1,000 mg total) by mouth 2 (two) times daily with a meal. 180 tablet 1   Multiple Vitamin (MULTIVITAMIN WITH MINERALS) TABS tablet Take 1 tablet by mouth daily.     Multiple Vitamins-Minerals (AIRBORNE PO) Take by mouth.     nicotine (NICODERM CQ - DOSED IN MG/24 HOURS) 21 mg/24hr patch Place 1 patch (21 mg total) onto the skin daily. 28 patch 1   No current facility-administered medications on file prior to visit.   Allergies  Allergen Reactions   Farxiga [Dapagliflozin] Itching        Objective:    BP 114/78   Pulse 94   Temp 98.3 F (36.8 C) (Oral)   Ht 5\' 8"  (1.727 m)   Wt 233 lb (105.7 kg)   SpO2 98%   BMI 35.43 kg/m    Physical Exam Vitals and nursing note reviewed.  Constitutional:      Appearance: Normal appearance. She is normal weight.  HENT:     Head: Normocephalic and atraumatic.     Right Ear: Tympanic membrane, ear canal and external ear normal.     Left Ear: Tympanic membrane, ear canal and external ear normal.     Nose: Nose normal.     Mouth/Throat:  Mouth: Mucous membranes are moist.     Pharynx: Posterior oropharyngeal erythema present.  Eyes:     Conjunctiva/sclera: Conjunctivae normal.  Cardiovascular:     Rate and Rhythm: Normal rate and regular rhythm.     Pulses: Normal pulses.     Heart sounds: Normal heart sounds.  Pulmonary:     Effort: Pulmonary effort is normal.     Breath sounds: Normal breath sounds.  Musculoskeletal:     Cervical back: Neck supple. No tenderness.  Lymphadenopathy:     Cervical: No cervical adenopathy.  Skin:    General: Skin is warm and dry.  Neurological:     General: No focal deficit present.     Mental Status: She is alert and oriented to person, place, and time. Mental status is at baseline.  Psychiatric:        Mood and Affect: Mood normal.        Behavior: Behavior normal.        Thought Content: Thought content normal.         Judgment: Judgment normal.     No results found for any visits on 08/22/22.      Assessment & Plan:   Problem List Items Addressed This Visit       Respiratory   Viral URI with cough    Reassured patient that symptoms and exam findings are most consistent with a viral upper respiratory infection and explained lack of efficacy of antibiotics against viruses.  Discussed expected course and features suggestive of secondary bacterial infection.  Continue supportive care. Increase fluid intake with water or electrolyte solution like pedialyte. Encouraged acetaminophen as needed for fever/pain. Encouraged salt water gargling, chloraseptic spray and throat lozenges. Encouraged OTC guaifenesin. Encouraged saline sinus flushes and/or neti with humidified air.       Relevant Orders   SARS-CoV-2 RNA, Influenza A/B, and RSV RNA, Qualitative NAAT     Endocrine   Diabetes mellitus without complication (HCC) - Primary   Relevant Orders   Microalbumin / creatinine urine ratio   Other Visit Diagnoses     Colon cancer screening       Relevant Orders   Cologuard       No orders of the defined types were placed in this encounter.   Return if symptoms worsen or fail to improve.  Park Meo, FNP

## 2022-08-23 LAB — MICROALBUMIN / CREATININE URINE RATIO
Creatinine, Urine: 233 mg/dL (ref 20–275)
Microalb Creat Ratio: 9 mcg/mg creat (ref <30)
Microalb, Ur: 2 mg/dL

## 2022-08-23 LAB — SARS-COV-2 RNA, INFLUENZA A/B, AND RSV RNA, QUALITATIVE NAAT
INFLUENZA A RNA: NOT DETECTED
INFLUENZA B RNA: NOT DETECTED
RSV RNA: NOT DETECTED
SARS COV2 RNA: NOT DETECTED

## 2022-08-28 ENCOUNTER — Ambulatory Visit: Payer: BC Managed Care – PPO | Attending: Family Medicine

## 2022-08-28 DIAGNOSIS — R011 Cardiac murmur, unspecified: Secondary | ICD-10-CM

## 2022-08-28 LAB — ECHOCARDIOGRAM COMPLETE
AR max vel: 1.98 cm2
AV Area VTI: 2.18 cm2
AV Area mean vel: 2.03 cm2
AV Mean grad: 8 mmHg
AV Peak grad: 14.1 mmHg
Ao pk vel: 1.88 m/s
Area-P 1/2: 3.68 cm2
S' Lateral: 2.7 cm

## 2022-09-01 ENCOUNTER — Other Ambulatory Visit: Payer: Self-pay | Admitting: Obstetrics & Gynecology

## 2022-09-01 DIAGNOSIS — Z8742 Personal history of other diseases of the female genital tract: Secondary | ICD-10-CM

## 2022-09-10 DIAGNOSIS — Z1211 Encounter for screening for malignant neoplasm of colon: Secondary | ICD-10-CM | POA: Diagnosis not present

## 2022-09-19 ENCOUNTER — Encounter: Payer: BC Managed Care – PPO | Attending: Family Medicine | Admitting: Nutrition

## 2022-09-19 DIAGNOSIS — Z713 Dietary counseling and surveillance: Secondary | ICD-10-CM | POA: Diagnosis not present

## 2022-09-19 DIAGNOSIS — E669 Obesity, unspecified: Secondary | ICD-10-CM | POA: Diagnosis not present

## 2022-09-19 DIAGNOSIS — I10 Essential (primary) hypertension: Secondary | ICD-10-CM

## 2022-09-19 DIAGNOSIS — E782 Mixed hyperlipidemia: Secondary | ICD-10-CM

## 2022-09-19 DIAGNOSIS — E1165 Type 2 diabetes mellitus with hyperglycemia: Secondary | ICD-10-CM

## 2022-09-19 DIAGNOSIS — E119 Type 2 diabetes mellitus without complications: Secondary | ICD-10-CM | POA: Diagnosis not present

## 2022-09-19 NOTE — Patient Instructions (Signed)
Goals  Exercise 30 minutes 3-4 times per week. Focus on more whole food plant based meals. Work on meal planning and meal prepping. Test blood sugars twice a day Get A1C down to  6.5%  Lose 1 lb per week Keep food journal and BS log What "We are what we eat."

## 2022-09-19 NOTE — Progress Notes (Signed)
Medical Nutrition Therapy  Appointment Start time:  1600  Appointment End time:  19  Primary concerns today: Dm Type 2, Obesity  Referral diagnosis: E11.8, E66.01 Preferred learning style: No preference  Learning readiness: Ready    NUTRITION ASSESSMENT Dm Type 2, Obesity Follow up  FBS:137-149 mg/dl. Hasn't been exercising.A1C is better is 7.5% , down from 8.5%. Glipizide, Metformin 123XX123 mg BID, Trulicity.   Still struggles with eating meals on time and preparing meals ahead of time.  Diet is still low in fiber rich foods, plant/whole based foods of low carb vetables, fruits and whole grains.  Elevated LDL. Strong family history of heart disease. She is at high risk for a cardiovascular event. She does smoke also. Stressed need for dietary changes, weight loss, stop smoking and getting back on insulin for needed blood sugar control.  Lab Results  Component Value Date   HGBA1C 7.5 (A) 07/04/2022      Latest Ref Rng & Units 07/04/2022    3:52 PM 05/19/2021    9:32 AM 08/29/2020    8:40 AM  CMP  Glucose 70 - 99 mg/dL 67  199  256   BUN 6 - 24 mg/dL 13  8  10   $ Creatinine 0.57 - 1.00 mg/dL 1.03  0.80  0.78   Sodium 134 - 144 mmol/L 141  141  140   Potassium 3.5 - 5.2 mmol/L 4.5  4.7  4.2   Chloride 96 - 106 mmol/L 104  104  107   CO2 20 - 29 mmol/L 20  22  22   $ Calcium 8.7 - 10.2 mg/dL 10.2  10.2  9.5   Total Protein 6.0 - 8.5 g/dL 7.6  7.3  7.1   Total Bilirubin 0.0 - 1.2 mg/dL 0.2  0.4  0.6   Alkaline Phos 44 - 121 IU/L 95  111    AST 0 - 40 IU/L 12  10  10   $ ALT 0 - 32 IU/L 8  8  13   $ Lipid Panel     Component Value Date/Time   CHOL 126 07/04/2022 1552   TRIG 60 07/04/2022 1552   HDL 35 (L) 07/04/2022 1552   CHOLHDL 3.6 07/04/2022 1552   CHOLHDL 4.1 08/29/2020 0840   LDLCALC 78 07/04/2022 1552   LDLCALC 96 08/29/2020 0840   LABVLDL 13 07/04/2022 1552        Wt Readings from Last 3 Encounters:  08/22/22 233 lb (105.7 kg)  07/25/22 237 lb 12.8 oz (107.9 kg)   07/04/22 238 lb (108 kg)   Ht Readings from Last 3 Encounters:  08/22/22 5' 8"$  (1.727 m)  07/25/22 5' 8"$  (1.727 m)  07/04/22 5' 8"$  (1.727 m)   There is no height or weight on file to calculate BMI. @BMIFA$ @ Facility age limit for growth %iles is 20 years. Facility age limit for growth %iles is 20 years.   Preferred Learning Style:  Visual and Read   Learning Readiness:   Ready Change in progress   MEDICATIONS:    DIETARY INTAKE:  24-hr recall:  B ( AM): FBS 166 mg/dl. Pb sandwich, water L ( PM): Spaghetti, 1 cup, water Snk ( PM):  D ( PM):  Chicken, broccoli, tomatoes,  Water. Snk ( PM):  Beverages: water  Usual physical activity:  Walks some.  Estimated energy needs: 1200  calories 135 g carbohydrates 90 g protein 33 g fat  Progress Towards Goal(s):  In progress.   Nutritional Diagnosis:  NB-1.1 Food and  nutrition-related knowledge deficit As related to Diabetes Type 2.  As evidenced by A1C >   7. 4%.    Intervention:  Nutrition and Diabetes education provided on My Plate, CHO counting, meal planning, portion sizes, timing of meals, avoiding snacks between meals unless having a low blood sugar, target ranges for A1C and blood sugars, signs/symptoms and treatment of hyper/hypoglycemia, monitoring blood sugars, taking medications as prescribed, benefits of exercising 30 minutes per day and prevention of complications of DM.  Lifestyle Medicine - Whole Food, Plant Predominant Nutrition is highly recommended: Eat Plenty of vegetables, Mushrooms, fruits, Legumes, Whole Grains, Nuts, seeds in lieu of processed meats, processed snacks/pastries red meat, poultry, eggs.    -It is better to avoid simple carbohydrates including: Cakes, Sweet Desserts, Ice Cream, Soda (diet and regular), Sweet Tea, Candies, Chips, Cookies, Store Bought Juices, Alcohol in Excess of  1-2 drinks a day, Lemonade,  Artificial Sweeteners, Doughnuts, Coffee Creamers, "Sugar-free" Products, etc,  etc.  This is not a complete list.....  Exercise: If you are able: 30 -60 minutes a day ,4 days a week, or 150 minutes a week.  The longer the better.  Combine stretch, strength, and aerobic activities.  If you were told in the past that you have high risk for cardiovascular diseases, you may seek evaluation by your heart doctor prior to initiating moderate to intense exercise programs.  Goals  Exercise 30 minutes 3-4 times per week. Focus on more whole food plant based meals. Work on meal planning and meal prepping. Test blood sugars twice a day Get A1C down to  6.5%  Lose 1 lb per week Keep food journal and BS log What "We are what we eat."   Teaching Method Utilized:  Visual Auditory   Handouts given during visit include: The Plate Method   Barriers to learning/adherence to lifestyle change: none  Demonstrated degree of understanding via:  Teach Back   Monitoring/Evaluation:  Dietary intake, exercise, , and body weight in 3 month(s). Recommend to start long acting insulin to help get blood sugars back down if BS before meals are still over 180 mg/dl consistently. Marland Kitchen

## 2022-09-20 LAB — COLOGUARD: COLOGUARD: NEGATIVE

## 2022-10-02 ENCOUNTER — Other Ambulatory Visit: Payer: BC Managed Care – PPO

## 2022-10-04 ENCOUNTER — Ambulatory Visit: Payer: BC Managed Care – PPO | Admitting: Internal Medicine

## 2022-10-05 ENCOUNTER — Other Ambulatory Visit: Payer: BC Managed Care – PPO

## 2022-10-05 DIAGNOSIS — E119 Type 2 diabetes mellitus without complications: Secondary | ICD-10-CM | POA: Diagnosis not present

## 2022-10-06 LAB — COMPLETE METABOLIC PANEL WITH GFR
AG Ratio: 1.2 (calc) (ref 1.0–2.5)
ALT: 9 U/L (ref 6–29)
AST: 8 U/L — ABNORMAL LOW (ref 10–35)
Albumin: 4.1 g/dL (ref 3.6–5.1)
Alkaline phosphatase (APISO): 76 U/L (ref 31–125)
BUN: 17 mg/dL (ref 7–25)
CO2: 22 mmol/L (ref 20–32)
Calcium: 9.4 mg/dL (ref 8.6–10.2)
Chloride: 106 mmol/L (ref 98–110)
Creat: 0.81 mg/dL (ref 0.50–0.99)
Globulin: 3.3 g/dL (calc) (ref 1.9–3.7)
Glucose, Bld: 116 mg/dL — ABNORMAL HIGH (ref 65–99)
Potassium: 4.4 mmol/L (ref 3.5–5.3)
Sodium: 138 mmol/L (ref 135–146)
Total Bilirubin: 0.3 mg/dL (ref 0.2–1.2)
Total Protein: 7.4 g/dL (ref 6.1–8.1)
eGFR: 91 mL/min/{1.73_m2} (ref >=60)

## 2022-10-06 LAB — CBC WITH DIFFERENTIAL/PLATELET
Absolute Monocytes: 442 cells/uL (ref 200–950)
Basophils Absolute: 64 cells/uL (ref 0–200)
Basophils Relative: 0.7 %
Eosinophils Absolute: 386 cells/uL (ref 15–500)
Eosinophils Relative: 4.2 %
HCT: 37.9 % (ref 35.0–45.0)
Hemoglobin: 12.5 g/dL (ref 11.7–15.5)
Lymphs Abs: 3220 cells/uL (ref 850–3900)
MCH: 26.7 pg — ABNORMAL LOW (ref 27.0–33.0)
MCHC: 33 g/dL (ref 32.0–36.0)
MCV: 81 fL (ref 80.0–100.0)
MPV: 9.7 fL (ref 7.5–12.5)
Monocytes Relative: 4.8 %
Neutro Abs: 5088 cells/uL (ref 1500–7800)
Neutrophils Relative %: 55.3 %
Platelets: 327 10*3/uL (ref 140–400)
RBC: 4.68 10*6/uL (ref 3.80–5.10)
RDW: 13 % (ref 11.0–15.0)
Total Lymphocyte: 35 %
WBC: 9.2 10*3/uL (ref 3.8–10.8)

## 2022-10-06 LAB — LIPID PANEL
Cholesterol: 133 mg/dL (ref <200)
HDL: 40 mg/dL — ABNORMAL LOW (ref >=50)
LDL Cholesterol (Calc): 84 mg/dL (calc)
Non-HDL Cholesterol (Calc): 93 mg/dL (calc) (ref <130)
Total CHOL/HDL Ratio: 3.3 (calc) (ref <5.0)
Triglycerides: 32 mg/dL (ref <150)

## 2022-10-06 LAB — HEMOGLOBIN A1C
Hgb A1c MFr Bld: 7.8 % of total Hgb — ABNORMAL HIGH (ref <5.7)
Mean Plasma Glucose: 177 mg/dL
eAG (mmol/L): 9.8 mmol/L

## 2022-10-08 ENCOUNTER — Other Ambulatory Visit: Payer: Self-pay | Admitting: Family Medicine

## 2022-10-08 DIAGNOSIS — E1169 Type 2 diabetes mellitus with other specified complication: Secondary | ICD-10-CM

## 2022-10-08 MED ORDER — TRULICITY 4.5 MG/0.5ML ~~LOC~~ SOAJ: 4.5000 mg | SUBCUTANEOUS | 0 refills | Status: DC

## 2022-10-09 ENCOUNTER — Telehealth: Payer: Self-pay | Admitting: Family Medicine

## 2022-10-09 NOTE — Telephone Encounter (Signed)
Patient called to follow up on provider's new script of [place]; stated it hasn't been called into the pharmacy.   Pharmacy confirmed as:  [facility] [place] [facility] [address] Mail Order pharmacy, [place] [address] Phone: [phone]  Fax: [phone]   Please advise patient at [phone]

## 2022-10-22 ENCOUNTER — Encounter: Payer: Self-pay | Admitting: Nutrition

## 2022-10-30 ENCOUNTER — Ambulatory Visit: Payer: BC Managed Care – PPO | Admitting: Nutrition

## 2022-11-26 ENCOUNTER — Ambulatory Visit: Payer: BC Managed Care – PPO | Admitting: Family Medicine

## 2022-11-26 ENCOUNTER — Encounter: Payer: Self-pay | Admitting: Family Medicine

## 2022-11-26 VITALS — BP 115/70 | HR 110 | Temp 97.6°F | Ht 68.0 in | Wt 242.0 lb

## 2022-11-26 DIAGNOSIS — H538 Other visual disturbances: Secondary | ICD-10-CM | POA: Diagnosis not present

## 2022-11-26 NOTE — Progress Notes (Signed)
   Acute Office Visit  Subjective:     Patient ID: Mariah [name], female    DOB: 1976/08/08, 47 y.o.   MRN: [id number]  Chief Complaint  Patient presents with   Follow-up    Redness to corner of R eye since 11/05/2022.    HPI Patient is in today for redness to outer eye, mild itching to right eye for 3 weeks. Endorses watery eyes bilaterally, some crusting in the corner of her eyes. Denies pain. Endorses blurred vision to right eye that is getting worse. Denies redness to eyelid or under eye. Has tried visine. Reports having worn glitter and fake eyelashes with residual glue just prior to onset.  Review of Systems  All other systems reviewed and are negative.       Objective:    BP 115/70   Pulse (!) 110   Temp 97.6 F (36.4 C) (Oral)   Ht 5\' 8"  (1.727 m)   Wt 242 lb (109.8 kg)   SpO2 98%   BMI 36.80 kg/m    Physical Exam Vitals and nursing note reviewed.  Constitutional:      Appearance: Normal appearance. She is normal weight.  HENT:     Head: Normocephalic and atraumatic.  Eyes:     General: Lids are normal. Lids are everted, no foreign bodies appreciated. Gaze aligned appropriately.        Right eye: No discharge.        Left eye: No discharge.     Extraocular Movements: Extraocular movements intact.     Conjunctiva/sclera: Conjunctivae normal.  Skin:    General: Skin is warm and dry.  Neurological:     General: No focal deficit present.     Mental Status: She is alert and oriented to person, place, and time. Mental status is at baseline.  Psychiatric:        Mood and Affect: Mood normal.        Behavior: Behavior normal.        Thought Content: Thought content normal.        Judgment: Judgment normal.     No results found for any visits on 11/26/22.      Assessment & Plan:   Problem List Items Addressed This Visit       Other   Blurred vision, right eye - Primary    Symptoms have resolved but she does complain of ongoing blurred vision.  Exam unremarkable. Will refer to ophthalmology for vision changes. Instructed to return to office if symptoms return.      Relevant Orders   Ambulatory referral to Ophthalmology    No orders of the defined types were placed in this encounter.   Return if symptoms worsen or fail to improve.  Rubie Maid, FNP

## 2022-11-26 NOTE — Assessment & Plan Note (Signed)
Symptoms have resolved but she does complain of ongoing blurred vision. Exam unremarkable. Will refer to ophthalmology for vision changes. Instructed to return to office if symptoms return.

## 2022-12-07 ENCOUNTER — Other Ambulatory Visit: Payer: BC Managed Care – PPO

## 2022-12-17 LAB — HM DIABETES EYE EXAM

## 2023-01-14 ENCOUNTER — Encounter: Payer: Self-pay | Admitting: Family Medicine

## 2023-01-14 ENCOUNTER — Ambulatory Visit: Payer: BC Managed Care – PPO | Admitting: Family Medicine

## 2023-01-14 VITALS — BP 102/68 | HR 105 | Temp 99.2°F | Ht 68.0 in | Wt 246.0 lb

## 2023-01-14 DIAGNOSIS — E1169 Type 2 diabetes mellitus with other specified complication: Secondary | ICD-10-CM | POA: Insufficient documentation

## 2023-01-14 DIAGNOSIS — E669 Obesity, unspecified: Secondary | ICD-10-CM

## 2023-01-14 DIAGNOSIS — Z7984 Long term (current) use of oral hypoglycemic drugs: Secondary | ICD-10-CM

## 2023-01-14 MED ORDER — FREESTYLE LIBRE 3 SENSOR MISC: 1.0000 | 3 refills | Status: AC

## 2023-01-14 NOTE — Assessment & Plan Note (Signed)
A1c today. Continue Metformin, Glipizide, and Trulicity. Continue to work on diet, exercise, and weight management. Follow up in 6 months.

## 2023-01-14 NOTE — Progress Notes (Signed)
Acute Office Visit  Subjective:     Patient ID: Mariah [name], female    DOB: Nov 17, 1975, 47 y.o.   MRN: [id number]  Chief Complaint  Patient presents with   Follow-up    Med f/u A1c check    HPI Patient is in today for diabetes follow-up. Last A1c was 7.8% and she was started on Trulicity and is now taking 4.5mg  weekly. She is also taking Glipizide 10mg  daily and Metformin 1000mg  twice daily.  DIABETES Hypoglycemic episodes:no Polydipsia/polyuria: no Visual disturbance: no Chest pain: no Paresthesias: no Glucose Monitoring: yes  Accucheck frequency: Daily  Fasting glucose: 130-140  Post prandial:  Evening:  Before meals: Taking Insulin?: no  Long acting insulin:  Short acting insulin: Blood Pressure Monitoring: not checking Retinal Examination: Up to Date Foot Exam: Up to Date Diabetic Education: Completed Pneumovax: Not up to Date, declined Influenza: Up to Date Aspirin: no   Review of Systems  All other systems reviewed and are negative.   Past Medical History:  Diagnosis Date   Abnormal Pap smear 09/26/10   ASCUS   Abnormal uterine bleeding    Asthma    Albuterol Inhaler last used 7/12   Diabetes (HCC)    Diabetes mellitus without complication (HCC)    Hypercholesteremia    Morbid obesity (HCC)    Routine adult health maintenance 07/25/2022   Simple endometrial hyperplasia without atypia    Past Surgical History:  Procedure Laterality Date   APPENDECTOMY     CYSTECTOMY  1989   ENDOMETRIAL BIOPSY  02/13/11   AUB   HYSTEROSCOPY WITH D & C  04/16/2011   Procedure: DILATATION AND CURETTAGE (D&C) /HYSTEROSCOPY;  Surgeon: Tereso Newcomer, MD;  Location: [facility];  Service: Gynecology;  Laterality: N/A;  Diagnostic   polyp removal  2010   Current Outpatient Medications on File Prior to Visit  Medication Sig Dispense Refill   atorvastatin (LIPITOR) 80 MG tablet Take 1 tablet (80 mg total) by mouth daily. 90 tablet 1   Dulaglutide (TRULICITY) 4.5  MG/0.5ML SOPN Inject 4.5 mg as directed once a week. 6 mL 0   glipiZIDE (GLUCOTROL) 10 MG tablet Take 1 tablet (10 mg total) by mouth 2 (two) times daily before a meal. 180 tablet 1   lisinopril (ZESTRIL) 2.5 MG tablet Take 1 tablet (2.5 mg total) by mouth daily. 90 tablet 1   megestrol (MEGACE) 40 MG tablet TAKE 1 TABLET BY MOUTH ONCE DAILY -  CAN  INCREASE  TO  2  TABLETS  EVERY  DAY  IN  THE  EVENT  OF  HEAVY  BLEEDING 90 tablet 3   metFORMIN (GLUCOPHAGE) 1000 MG tablet Take 1 tablet (1,000 mg total) by mouth 2 (two) times daily with a meal. 180 tablet 1   Multiple Vitamin (MULTIVITAMIN WITH MINERALS) TABS tablet Take 1 tablet by mouth daily.     Multiple Vitamins-Minerals (AIRBORNE PO) Take by mouth.     nicotine (NICODERM CQ - DOSED IN MG/24 HOURS) 21 mg/24hr patch Place 1 patch (21 mg total) onto the skin daily. 28 patch 1   No current facility-administered medications on file prior to visit.   Allergies  Allergen Reactions   Farxiga [Dapagliflozin] Itching       Objective:    BP 102/68   Pulse (!) 105   Temp 99.2 F (37.3 C) (Oral)   Ht 5\' 8"  (1.727 m)   Wt 246 lb (111.6 kg)   LMP  (LMP Unknown)  SpO2 99%   BMI 37.40 kg/m  Wt Readings from Last 3 Encounters:  01/14/23 246 lb (111.6 kg)  11/26/22 242 lb (109.8 kg)  08/22/22 233 lb (105.7 kg)      Physical Exam Vitals and nursing note reviewed.  Constitutional:      Appearance: Normal appearance. She is normal weight.  HENT:     Head: Normocephalic and atraumatic.  Cardiovascular:     Rate and Rhythm: Normal rate and regular rhythm.     Pulses: Normal pulses.     Heart sounds: Murmur heard.  Pulmonary:     Effort: Pulmonary effort is normal.     Breath sounds: Normal breath sounds.  Skin:    General: Skin is warm and dry.  Neurological:     General: No focal deficit present.     Mental Status: She is alert and oriented to person, place, and time. Mental status is at baseline.  Psychiatric:        Mood and  Affect: Mood normal.        Behavior: Behavior normal.        Thought Content: Thought content normal.        Judgment: Judgment normal.     No results found for any visits on 01/14/23.      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 Continue Metformin, Glipizide, and Trulicity. Continue to work on diet, exercise, and weight management. Follow up in 6 months.      Relevant Orders   Hemoglobin A1c   COMPLETE METABOLIC PANEL WITH GFR    Meds ordered this encounter  Medications   Continuous Glucose Sensor (FREESTYLE LIBRE 3 SENSOR) MISC    Sig: 1 Application by Does not apply route every 14 (fourteen) days. Place 1 sensor on the skin every 14 days. Use to check glucose continuously    Dispense:  1 each    Refill:  3    Order Specific Question:   Supervising Provider    Answer:   Lynnea Ferrier T [[id number]]    Return in about 6 months (around 07/17/2023).  Park Meo, FNP

## 2023-01-15 LAB — COMPLETE METABOLIC PANEL WITH GFR
AG Ratio: 1.6 (calc) (ref 1.0–2.5)
ALT: 11 U/L (ref 6–29)
AST: 10 U/L (ref 10–35)
Albumin: 4.2 g/dL (ref 3.6–5.1)
Alkaline phosphatase (APISO): 82 U/L (ref 31–125)
BUN: 16 mg/dL (ref 7–25)
CO2: 22 mmol/L (ref 20–32)
Calcium: 10 mg/dL (ref 8.6–10.2)
Chloride: 106 mmol/L (ref 98–110)
Creat: 0.8 mg/dL (ref 0.50–0.99)
Globulin: 2.7 g/dL (calc) (ref 1.9–3.7)
Glucose, Bld: 140 mg/dL — ABNORMAL HIGH (ref 65–99)
Potassium: 4.3 mmol/L (ref 3.5–5.3)
Sodium: 137 mmol/L (ref 135–146)
Total Bilirubin: 0.4 mg/dL (ref 0.2–1.2)
Total Protein: 6.9 g/dL (ref 6.1–8.1)
eGFR: 92 mL/min/{1.73_m2} (ref >=60)

## 2023-01-15 LAB — HEMOGLOBIN A1C
Hgb A1c MFr Bld: 8.7 % of total Hgb — ABNORMAL HIGH (ref <5.7)
Mean Plasma Glucose: 203 mg/dL
eAG (mmol/L): 11.2 mmol/L

## 2023-01-22 ENCOUNTER — Other Ambulatory Visit: Payer: Self-pay | Admitting: Physician Assistant

## 2023-01-22 ENCOUNTER — Telehealth: Payer: Self-pay

## 2023-01-22 DIAGNOSIS — E669 Obesity, unspecified: Secondary | ICD-10-CM

## 2023-01-22 DIAGNOSIS — E1169 Type 2 diabetes mellitus with other specified complication: Secondary | ICD-10-CM

## 2023-01-22 NOTE — Telephone Encounter (Signed)
Pt called in concerned about getting this med Dulaglutide (TRULICITY) 4.5 refilled. Pt states that pharmacies are stating that they do not have this dosage in stock. Pt would like a cb as to what she may need to do about taking or getting this med please. Please advise.  Cb#: [phone]

## 2023-01-24 ENCOUNTER — Other Ambulatory Visit: Payer: Self-pay

## 2023-01-24 DIAGNOSIS — E1169 Type 2 diabetes mellitus with other specified complication: Secondary | ICD-10-CM

## 2023-01-24 NOTE — Telephone Encounter (Signed)
Prescription Request  01/24/2023  LOV: 01/14/23  What is the name of the medication or equipment? atorvastatin (LIPITOR) 80 MG tablet  Have you contacted your pharmacy to request a refill? Yes   Which pharmacy would you like this sent to?  [facility] [place] - [address] AT [facility] mail services [address] Mail Order pharmacy [place] [address] Phone: [phone] Fax: [phone]    Patient notified that their request is being sent to the clinical staff for review and that they should receive a response within 2 business days.   Please advise at [facility] [phone]

## 2023-01-24 NOTE — Telephone Encounter (Signed)
Prescription Request  01/24/2023  LOV: 01/14/23  What is the name of the medication or equipment? glipiZIDE (GLUCOTROL) 10 MG tablet [[id number]]  Have you contacted your pharmacy to request a refill? Yes   Which pharmacy would you like this sent to?  [facility] [place] - [address] AT [facility] mail services [address] Mail Order pharmacy [place] [address] Phone: [phone] Fax: [phone]    Patient notified that their request is being sent to the clinical staff for review and that they should receive a response within 2 business days.   Please advise at [facility] [phone]

## 2023-01-24 NOTE — Telephone Encounter (Signed)
Prescription Request  01/24/2023  LOV: 01/14/23  What is the name of the medication or equipment? lisinopril (ZESTRIL) 2.5 MG tablet [[id number]]  Have you contacted your pharmacy to request a refill? Yes   Which pharmacy would you like this sent to?  [facility] [place] - [address] AT [facility] mail services [address] Mail Order pharmacy [place] [address] Phone: [phone] Fax: [phone]    Patient notified that their request is being sent to the clinical staff for review and that they should receive a response within 2 business days.   Please advise at [facility] [phone]

## 2023-01-24 NOTE — Telephone Encounter (Signed)
Requested medication (s) are due for refill today: yes  Requested medication (s) are on the active medication list: yes  Last refill:  07/05/22 #90/1   Future visit scheduled: yes  Notes to clinic:  refill by last PCP, established care 08/2022. Please advise for refill      Requested Prescriptions  Pending Prescriptions Disp Refills   lisinopril (ZESTRIL) 2.5 MG tablet 90 tablet 1    Sig: Take 1 tablet (2.5 mg total) by mouth daily.     Cardiovascular:  ACE Inhibitors Failed - 01/24/2023 10:15 AM      Failed - Valid encounter within last 6 months    Recent Outpatient Visits           6 months ago Type 2 diabetes mellitus with obesity [facility])   [facility] [place], [place], [place]   1 year ago Type 2 diabetes mellitus with obesity [facility]   [facility] Jonah Blue B, [place]   1 year ago Type 2 diabetes mellitus with obesity [facility])   [facility] Jonah Blue B, MD   1 year ago Type 2 diabetes mellitus with obesity [facility])   [facility] [place], Cornelius Moras, RPH-CPP   1 year ago Type 2 diabetes mellitus with obesity [facility])   [facility] [place], Cornelius Moras, RPH-CPP       Future Appointments             In 5 months Park Meo, FNP [facility]            Passed - Cr in normal range and within 180 days    Creat  Date Value Ref Range Status  01/14/2023 0.80 0.50 - 0.99 mg/dL Final   Creatinine, Urine  Date Value Ref Range Status  08/22/2022 233 20 - 275 mg/dL Final         Passed - K in normal range and within 180 days    Potassium  Date Value Ref Range Status  01/14/2023 4.3 3.5 - 5.3 mmol/L Final         Passed - Patient is not pregnant      Passed - Last BP in normal range    BP Readings from Last 1 Encounters:  01/14/23 102/68

## 2023-01-25 MED ORDER — LISINOPRIL 2.5 MG PO TABS: 2.5000 mg | ORAL_TABLET | Freq: Every day | ORAL | 1 refills | Status: DC

## 2023-01-29 ENCOUNTER — Other Ambulatory Visit: Payer: Self-pay

## 2023-01-29 ENCOUNTER — Other Ambulatory Visit: Payer: Self-pay | Admitting: Family Medicine

## 2023-01-29 DIAGNOSIS — E1169 Type 2 diabetes mellitus with other specified complication: Secondary | ICD-10-CM

## 2023-01-29 MED ORDER — SEMAGLUTIDE(0.25 OR 0.5MG/DOS) 2 MG/1.5ML ~~LOC~~ SOPN: PEN_INJECTOR | SUBCUTANEOUS | 0 refills | Status: DC

## 2023-01-29 MED ORDER — METFORMIN HCL 1000 MG PO TABS
1000.0000 mg | ORAL_TABLET | Freq: Two times a day (BID) | ORAL | 1 refills | Status: DC
Start: 2023-01-29 — End: 2023-08-19

## 2023-01-29 MED ORDER — GLIPIZIDE 10 MG PO TABS
10.0000 mg | ORAL_TABLET | Freq: Two times a day (BID) | ORAL | 1 refills | Status: DC
Start: 2023-01-29 — End: 2023-08-19

## 2023-01-29 MED ORDER — ATORVASTATIN CALCIUM 80 MG PO TABS
80.0000 mg | ORAL_TABLET | Freq: Every day | ORAL | 1 refills | Status: DC
Start: 2023-01-29 — End: 2023-08-19

## 2023-01-29 NOTE — Progress Notes (Signed)
Trucility unavailable at pharmacies, will switch to Ozempic.

## 2023-02-01 ENCOUNTER — Encounter: Payer: Self-pay | Admitting: Family Medicine

## 2023-02-04 ENCOUNTER — Other Ambulatory Visit: Payer: Self-pay | Admitting: Family Medicine

## 2023-02-04 MED ORDER — RYBELSUS 7 MG PO TABS: 7.0000 mg | ORAL_TABLET | Freq: Every day | ORAL | 0 refills | Status: DC

## 2023-02-04 MED ORDER — RYBELSUS 3 MG PO TABS: 3.0000 mg | ORAL_TABLET | Freq: Every day | ORAL | 0 refills | Status: DC

## 2023-03-05 ENCOUNTER — Other Ambulatory Visit: Payer: Self-pay | Admitting: Family Medicine

## 2023-03-05 MED ORDER — TRULICITY 1.5 MG/0.5ML ~~LOC~~ SOAJ: SUBCUTANEOUS | 0 refills | Status: DC

## 2023-03-19 ENCOUNTER — Other Ambulatory Visit: Payer: Self-pay | Admitting: Internal Medicine

## 2023-03-19 ENCOUNTER — Other Ambulatory Visit: Payer: Self-pay | Admitting: Family Medicine

## 2023-03-19 DIAGNOSIS — E1169 Type 2 diabetes mellitus with other specified complication: Secondary | ICD-10-CM

## 2023-03-22 ENCOUNTER — Other Ambulatory Visit: Payer: Self-pay

## 2023-03-22 MED ORDER — TRULICITY 3 MG/0.5ML ~~LOC~~ SOAJ: 3.0000 mg | SUBCUTANEOUS | 1 refills | Status: DC

## 2023-04-02 ENCOUNTER — Other Ambulatory Visit (HOSPITAL_BASED_OUTPATIENT_CLINIC_OR_DEPARTMENT_OTHER): Payer: Self-pay

## 2023-04-02 ENCOUNTER — Telehealth: Payer: Self-pay | Admitting: Family Medicine

## 2023-04-02 ENCOUNTER — Other Ambulatory Visit: Payer: Self-pay

## 2023-04-02 DIAGNOSIS — E119 Type 2 diabetes mellitus without complications: Secondary | ICD-10-CM

## 2023-04-02 MED ORDER — TRULICITY 1.5 MG/0.5ML ~~LOC~~ SOAJ
1.5000 mg | SUBCUTANEOUS | 1 refills | Status: DC
Start: 2023-04-02 — End: 2023-07-18

## 2023-04-02 NOTE — Telephone Encounter (Signed)
Prescription Request  04/02/2023  LOV: 01/14/2023  What is the name of the medication or equipment?  Dulaglutide (TRULICITY) 1.5MG  (3MG  currently on back order) **Received call from Santina Evans to request new script**  Have you contacted your pharmacy to request a refill? Yes   Which pharmacy would you like this sent to?  [facility] [place] - [address] AT [facility] mail services [address] Mail Order pharmacy [place] [address] Phone: [phone] Fax: [phone]    Patient notified that their request is being sent to the clinical staff for review and that they should receive a response within 2 business days.   Please advise pharmacist.

## 2023-06-11 ENCOUNTER — Encounter: Payer: Self-pay | Admitting: Obstetrics & Gynecology

## 2023-06-11 ENCOUNTER — Ambulatory Visit: Payer: BC Managed Care – PPO | Admitting: Obstetrics & Gynecology

## 2023-06-11 ENCOUNTER — Other Ambulatory Visit (HOSPITAL_COMMUNITY)
Admission: RE | Admit: 2023-06-11 | Discharge: 2023-06-11 | Disposition: A | Discharge status: Home / Self Care | Payer: BC Managed Care – PPO | Source: Ambulatory Visit | Attending: Obstetrics & Gynecology | Admitting: Obstetrics & Gynecology

## 2023-06-11 VITALS — BP 129/84 | HR 98 | Ht 68.0 in | Wt 246.6 lb

## 2023-06-11 DIAGNOSIS — Z01419 Encounter for gynecological examination (general) (routine) without abnormal findings: Secondary | ICD-10-CM | POA: Insufficient documentation

## 2023-06-11 DIAGNOSIS — Z1231 Encounter for screening mammogram for malignant neoplasm of breast: Secondary | ICD-10-CM | POA: Diagnosis not present

## 2023-06-11 NOTE — Progress Notes (Signed)
GYNECOLOGY ANNUAL PREVENTATIVE CARE ENCOUNTER NOTE  History:     Mariah [name] is a 47 y.o. G0P0000 female here for a routine annual gynecologic exam.  Current complaints: none.  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   Denies abnormal vaginal bleeding, discharge, pelvic pain, problems with intercourse or other gynecologic concerns.    Gynecologic History No LMP recorded. (Menstrual status: Other). Contraception: condoms Last Pap: 01/10/2021. Result was normal with negative HPV Last Mammogram: 03/10/2021.  Result was normal Last Cologuard: 09/10/2022.  Result was negative  Obstetric History OB History  Gravida Para Term Preterm AB Living  0 0 0 0 0 0  SAB IAB Ectopic Multiple Live Births  0 0 0 0 0    Past Medical History:  Diagnosis Date   Abnormal Pap smear 09/26/10   ASCUS   Abnormal uterine bleeding    Asthma    Albuterol Inhaler last used 7/12   Diabetes (HCC)    Diabetes mellitus without complication (HCC)    Hypercholesteremia    Morbid obesity (HCC)    Routine adult health maintenance 07/25/2022   Simple endometrial hyperplasia without atypia     Past Surgical History:  Procedure Laterality Date   APPENDECTOMY     CYSTECTOMY  1989   ENDOMETRIAL BIOPSY  02/13/11   AUB   HYSTEROSCOPY WITH D & C  04/16/2011   Procedure: DILATATION AND CURETTAGE (D&C) /HYSTEROSCOPY;  Surgeon: Tereso Newcomer, MD;  Location: [facility];  Service: Gynecology;  Laterality: N/A;  Diagnostic   polyp removal  2010    Current Outpatient Medications on File Prior to Visit  Medication Sig Dispense Refill   atorvastatin (LIPITOR) 80 MG tablet Take 1 tablet (80 mg total) by mouth daily. 90 tablet 1   Continuous Glucose Sensor (FREESTYLE LIBRE 3 SENSOR) MISC 1 Application by Does not apply route every 14 (fourteen) days. Place 1 sensor on the skin every 14 days. Use to check glucose continuously 1 each 3   Dulaglutide (TRULICITY) 1.5 MG/0.5ML  SOPN Inject 1.5 mg into the skin once a week. 6 mL 1   Dulaglutide (TRULICITY) 3 MG/0.5ML SOPN Inject 3 mg as directed once a week. 2 mL 1   glipiZIDE (GLUCOTROL) 10 MG tablet Take 1 tablet (10 mg total) by mouth 2 (two) times daily before a meal. 180 tablet 1   lisinopril (ZESTRIL) 2.5 MG tablet Take 1 tablet (2.5 mg total) by mouth daily. 90 tablet 1   megestrol (MEGACE) 40 MG tablet TAKE 1 TABLET BY MOUTH ONCE DAILY -  CAN  INCREASE  TO  2  TABLETS  EVERY  DAY  IN  THE  EVENT  OF  HEAVY  BLEEDING 90 tablet 3   metFORMIN (GLUCOPHAGE) 1000 MG tablet Take 1 tablet (1,000 mg total) by mouth 2 (two) times daily with a meal. 180 tablet 1   Multiple Vitamin (MULTIVITAMIN WITH MINERALS) TABS tablet Take 1 tablet by mouth daily.     Multiple Vitamins-Minerals (AIRBORNE PO) Take by mouth.     nicotine (NICODERM CQ - DOSED IN MG/24 HOURS) 21 mg/24hr patch Place 1 patch (21 mg total) onto the skin daily. 28 patch 1   No current facility-administered medications on file prior to visit.    Allergies  Allergen Reactions   Farxiga [Dapagliflozin] Itching    Social History:  reports that she has been smoking cigarettes. She has a 14 pack-year smoking history. She has never used smokeless  tobacco. She reports current alcohol use. She reports that she does not use drugs.  Family History  Problem Relation Age of Onset   Diabetes Mother    Heart disease Mother    Hyperlipidemia Mother    Diabetes Maternal Grandmother    Diabetes Maternal Grandfather    Cancer Paternal Grandmother    Breast cancer Paternal Grandmother     The following portions of the patient's history were reviewed and updated as appropriate: allergies, current medications, past family history, past medical history, past social history, past surgical history and problem list.  Review of Systems Pertinent items noted in HPI and remainder of comprehensive ROS otherwise negative.  Physical Exam:  BP 129/84   Pulse 98   Ht 5\' 8"   (1.727 m)   Wt 246 lb 9.6 oz (111.9 kg)   BMI 37.50 kg/m  CONSTITUTIONAL: Well-developed, well-nourished female in no acute distress.  HENT:  Normocephalic, atraumatic, External right and left ear normal.  EYES: Conjunctivae and EOM are normal. Pupils are equal, round, and reactive to light. No scleral icterus.  NECK: Normal range of motion, supple, no masses.  Normal thyroid.  SKIN: Skin is warm and dry. No rash noted. Not diaphoretic. No erythema. No pallor. MUSCULOSKELETAL: Normal range of motion. No tenderness.  No cyanosis, clubbing, or edema. NEUROLOGIC: Alert and oriented to person, place, and time. Normal reflexes, muscle tone coordination.  PSYCHIATRIC: Normal mood and affect. Normal behavior. Normal judgment and thought content. CARDIOVASCULAR: Normal heart rate noted, regular rhythm RESPIRATORY: Clear to auscultation bilaterally. Effort and breath sounds normal, no problems with respiration noted. BREASTS: Symmetric in size. No masses, tenderness, skin changes, nipple drainage, or lymphadenopathy bilaterally. Performed in the presence of a chaperone. ABDOMEN: Soft, no distention noted.  No tenderness, rebound or guarding.  PELVIC: Normal appearing external genitalia and urethral meatus; normal appearing vaginal mucosa and cervix.  No abnormal vaginal discharge noted.  Pap smear obtained.  Normal uterine size, no other palpable masses, no uterine or adnexal tenderness.  Performed in the presence of a chaperone.   Assessment and Plan:    1. Breast cancer screening by mammogram Mammogram scheduled for breast cancer screening. - MM 3D SCREENING MAMMOGRAM BILATERAL BREAST; Future  2. Well woman exam with routine gynecological exam - Cytology - PAP Will follow up results of pap smear and manage accordingly. Colon cancer screening is up to date. Routine preventative health maintenance measures emphasized. Please refer to After Visit Summary for other counseling recommendations.       Jaynie Collins, MD, FACOG Obstetrician & Gynecologist, [facility] for [employer], [facility]

## 2023-06-13 ENCOUNTER — Encounter: Payer: Self-pay | Admitting: Obstetrics & Gynecology

## 2023-06-13 DIAGNOSIS — R87810 Cervical high risk human papillomavirus (HPV) DNA test positive: Secondary | ICD-10-CM | POA: Insufficient documentation

## 2023-06-13 LAB — CYTOLOGY - PAP
Comment: NEGATIVE
Comment: NEGATIVE
Comment: NEGATIVE
Diagnosis: NEGATIVE
HPV 16: NEGATIVE
HPV 18 / 45: NEGATIVE
High risk HPV: POSITIVE — AB

## 2023-07-01 ENCOUNTER — Encounter: Payer: Self-pay | Admitting: Family Medicine

## 2023-07-01 ENCOUNTER — Ambulatory Visit: Payer: BC Managed Care – PPO | Admitting: Family Medicine

## 2023-07-01 VITALS — BP 118/78 | HR 109 | Temp 99.2°F | Ht 68.0 in | Wt 247.0 lb

## 2023-07-01 DIAGNOSIS — J069 Acute upper respiratory infection, unspecified: Secondary | ICD-10-CM | POA: Diagnosis not present

## 2023-07-01 LAB — HM DIABETES EYE EXAM

## 2023-07-01 MED ORDER — BENZONATATE 200 MG PO CAPS: 200.0000 mg | ORAL_CAPSULE | Freq: Two times a day (BID) | ORAL | 0 refills | Status: AC | PRN

## 2023-07-01 NOTE — Patient Instructions (Signed)
Your symptoms and exam findings are most consistent with a viral upper respiratory infection. These usually run their course in 5-7 days. Unfortunately, antibiotics don't work against viruses and just increase your risk of other issues such as diarrhea, yeast infections, and resistant infections.  If your symptoms last longer than 10 days and/or you start feeling worse with facial pain, high fever, cough, shortness of breath or start feeling significantly worse, please call us right away to be further evaluated.  Some things that can make you feel better are: - Increased rest - Increasing fluid with water/sugar free electrolytes - Acetaminophen as needed for fever/pain.  - Salt water gargling, chloraseptic spray and throat lozenges - OTC guaifenesin (Mucinex).  - Saline sinus flushes or a neti pot.  - Humidifying the air.  

## 2023-07-01 NOTE — Progress Notes (Signed)
Subjective:  HPI: Mariah [name] is a 47 y.o. female presenting on 07/01/2023 for Follow-up (Coughing allergies and breathing/)   HPI Patient is in today for 6 days of cough, headache, runny nose, watery eyes, wheezing at night Denies shortness of breath, congestion, sore throat, ear pain, fever, chills, body aches No known sick exposures, no home covid test Has tried Tylenol   Review of Systems  All other systems reviewed and are negative.   Relevant past medical history reviewed and updated as indicated.   Past Medical History:  Diagnosis Date   Abnormal Pap smear 09/26/10   ASCUS   Abnormal uterine bleeding    Asthma    Albuterol Inhaler last used 7/12   Diabetes (HCC)    Diabetes mellitus without complication (HCC)    Hypercholesteremia    Morbid obesity (HCC)    Routine adult health maintenance 07/25/2022   Simple endometrial hyperplasia without atypia      Past Surgical History:  Procedure Laterality Date   APPENDECTOMY     CYSTECTOMY  1989   ENDOMETRIAL BIOPSY  02/13/11   AUB   HYSTEROSCOPY WITH D & C  04/16/2011   Procedure: DILATATION AND CURETTAGE (D&C) /HYSTEROSCOPY;  Surgeon: Tereso Newcomer, MD;  Location: [facility];  Service: Gynecology;  Laterality: N/A;  Diagnostic   polyp removal  2010    Allergies and medications reviewed and updated.   Current Outpatient Medications:    atorvastatin (LIPITOR) 80 MG tablet, Take 1 tablet (80 mg total) by mouth daily., Disp: 90 tablet, Rfl: 1   benzonatate (TESSALON) 200 MG capsule, Take 1 capsule (200 mg total) by mouth 2 (two) times daily as needed for cough., Disp: 20 capsule, Rfl: 0   Continuous Glucose Sensor (FREESTYLE LIBRE 3 SENSOR) MISC, 1 Application by Does not apply route every 14 (fourteen) days. Place 1 sensor on the skin every 14 days. Use to check glucose continuously, Disp: 1 each, Rfl: 3   Dulaglutide (TRULICITY) 1.5 MG/0.5ML SOPN, Inject 1.5 mg into the skin once a week., Disp: 6 mL, Rfl: 1    Dulaglutide (TRULICITY) 3 MG/0.5ML SOPN, Inject 3 mg as directed once a week., Disp: 2 mL, Rfl: 1   glipiZIDE (GLUCOTROL) 10 MG tablet, Take 1 tablet (10 mg total) by mouth 2 (two) times daily before a meal., Disp: 180 tablet, Rfl: 1   lisinopril (ZESTRIL) 2.5 MG tablet, Take 1 tablet (2.5 mg total) by mouth daily., Disp: 90 tablet, Rfl: 1   megestrol (MEGACE) 40 MG tablet, TAKE 1 TABLET BY MOUTH ONCE DAILY -  CAN  INCREASE  TO  2  TABLETS  EVERY  DAY  IN  THE  EVENT  OF  HEAVY  BLEEDING, Disp: 90 tablet, Rfl: 3   metFORMIN (GLUCOPHAGE) 1000 MG tablet, Take 1 tablet (1,000 mg total) by mouth 2 (two) times daily with a meal., Disp: 180 tablet, Rfl: 1   Multiple Vitamin (MULTIVITAMIN WITH MINERALS) TABS tablet, Take 1 tablet by mouth daily., Disp: , Rfl:    Multiple Vitamins-Minerals (AIRBORNE PO), Take by mouth., Disp: , Rfl:    nicotine (NICODERM CQ - DOSED IN MG/24 HOURS) 21 mg/24hr patch, Place 1 patch (21 mg total) onto the skin daily., Disp: 28 patch, Rfl: 1  Allergies  Allergen Reactions   Farxiga [Dapagliflozin] Itching    Objective:   BP 118/78   Pulse (!) 109   Temp 99.2 F (37.3 C) (Oral)   Ht 5\' 8"  (1.727 m)   Wt  247 lb (112 kg)   LMP  (Approximate)   SpO2 97%   BMI 37.56 kg/m      07/01/2023    2:50 PM 06/11/2023   10:16 AM 01/14/2023    3:52 PM  Vitals with BMI  Height 5\' 8"  5\' 8"  5\' 8"   Weight 247 lbs 246 lbs 10 oz 246 lbs  BMI 37.56 37.5 37.41  Systolic 118 129 409  Diastolic 78 84 68  Pulse 109 98 105     Physical Exam Vitals and nursing note reviewed.  Constitutional:      Appearance: Normal appearance. She is normal weight.  HENT:     Head: Normocephalic and atraumatic.     Right Ear: Tympanic membrane, ear canal and external ear normal.     Left Ear: Tympanic membrane, ear canal and external ear normal.     Nose: Nose normal.     Mouth/Throat:     Mouth: Mucous membranes are moist.     Pharynx: Oropharynx is clear.  Eyes:     Extraocular  Movements: Extraocular movements intact.     Conjunctiva/sclera: Conjunctivae normal.     Pupils: Pupils are equal, round, and reactive to light.  Cardiovascular:     Rate and Rhythm: Normal rate and regular rhythm.     Pulses: Normal pulses.     Heart sounds: Normal heart sounds.  Pulmonary:     Effort: Pulmonary effort is normal.     Breath sounds: Normal breath sounds.  Musculoskeletal:     Cervical back: No tenderness.  Lymphadenopathy:     Cervical: No cervical adenopathy.  Skin:    General: Skin is warm and dry.  Neurological:     General: No focal deficit present.     Mental Status: She is alert and oriented to person, place, and time. Mental status is at baseline.  Psychiatric:        Mood and Affect: Mood normal.        Behavior: Behavior normal.        Thought Content: Thought content normal.        Judgment: Judgment normal.     Assessment & Plan:  Viral URI with cough Assessment & Plan: Reassured patient that symptoms and exam findings are most consistent with a viral upper respiratory infection and explained lack of efficacy of antibiotics against viruses.  Discussed expected course and features suggestive of secondary bacterial infection.  Continue supportive care. Increase fluid intake with water or electrolyte solution like pedialyte. Encouraged acetaminophen as needed for fever/pain. Encouraged salt water gargling, chloraseptic spray and throat lozenges. Encouraged OTC guaifenesin. Encouraged saline sinus flushes and/or neti with humidified air.     Other orders -     Benzonatate; Take 1 capsule (200 mg total) by mouth 2 (two) times daily as needed for cough.  Dispense: 20 capsule; Refill: 0     Follow up plan: Return if symptoms worsen or fail to improve.  Park Meo, FNP

## 2023-07-01 NOTE — Assessment & Plan Note (Signed)

## 2023-07-03 ENCOUNTER — Ambulatory Visit: Payer: BC Managed Care – PPO | Admitting: Family Medicine

## 2023-07-08 ENCOUNTER — Other Ambulatory Visit: Payer: Self-pay | Admitting: Family Medicine

## 2023-07-08 DIAGNOSIS — E669 Obesity, unspecified: Secondary | ICD-10-CM

## 2023-07-10 ENCOUNTER — Ambulatory Visit: Payer: BC Managed Care – PPO

## 2023-07-10 ENCOUNTER — Other Ambulatory Visit (HOSPITAL_COMMUNITY)
Admission: RE | Admit: 2023-07-10 | Discharge: 2023-07-10 | Disposition: A | Discharge status: Home / Self Care | Payer: BC Managed Care – PPO | Source: Ambulatory Visit | Attending: Family Medicine | Admitting: Family Medicine

## 2023-07-10 VITALS — BP 132/84 | HR 80

## 2023-07-10 DIAGNOSIS — N898 Other specified noninflammatory disorders of vagina: Secondary | ICD-10-CM | POA: Insufficient documentation

## 2023-07-10 NOTE — Progress Notes (Signed)
SUBJECTIVE:  47 y.o. female who desires a STI screen. Does Note some brown vaginal discharge x couple of weeks    Denies bleeding or significant pelvic pain. No UTI symptoms. Denies history of known exposure to STD.  No LMP recorded. (Menstrual status: Other).  OBJECTIVE:  She appears well.   ASSESSMENT:  STI Screen   PLAN:   GC, chlamydia, and trichomonas probe sent to lab.  Treatment: To be determined once lab results are received.  Pt follow up as needed.

## 2023-07-12 ENCOUNTER — Encounter: Payer: Self-pay | Admitting: Family Medicine

## 2023-07-12 ENCOUNTER — Ambulatory Visit
Admission: RE | Admit: 2023-07-12 | Discharge: 2023-07-12 | Disposition: A | Discharge status: Home / Self Care | Payer: BC Managed Care – PPO | Source: Ambulatory Visit | Attending: Obstetrics & Gynecology | Admitting: Obstetrics & Gynecology

## 2023-07-12 DIAGNOSIS — Z1231 Encounter for screening mammogram for malignant neoplasm of breast: Secondary | ICD-10-CM

## 2023-07-12 LAB — CERVICOVAGINAL ANCILLARY ONLY
Bacterial Vaginitis (gardnerella): NEGATIVE
Candida Glabrata: NEGATIVE
Candida Vaginitis: POSITIVE — AB
Chlamydia: NEGATIVE
Comment: NEGATIVE
Comment: NEGATIVE
Comment: NEGATIVE
Comment: NEGATIVE
Comment: NEGATIVE
Comment: NORMAL
Neisseria Gonorrhea: NEGATIVE
Trichomonas: NEGATIVE

## 2023-07-15 MED ORDER — FLUCONAZOLE 150 MG PO TABS: 150.0000 mg | ORAL_TABLET | Freq: Every day | ORAL | 2 refills | Status: AC

## 2023-07-15 NOTE — Addendum Note (Signed)
Addended by: Reva Bores on: 07/15/2023 08:45 AM   Modules accepted: Orders

## 2023-07-17 ENCOUNTER — Encounter: Payer: Self-pay | Admitting: Family Medicine

## 2023-07-17 ENCOUNTER — Ambulatory Visit: Payer: BC Managed Care – PPO | Admitting: Family Medicine

## 2023-07-17 VITALS — BP 120/68 | HR 107 | Temp 98.3°F | Ht 68.0 in | Wt 247.0 lb

## 2023-07-17 DIAGNOSIS — Z6837 Body mass index (BMI) 37.0-37.9, adult: Secondary | ICD-10-CM | POA: Diagnosis not present

## 2023-07-17 DIAGNOSIS — E669 Obesity, unspecified: Secondary | ICD-10-CM | POA: Diagnosis not present

## 2023-07-17 DIAGNOSIS — E1169 Type 2 diabetes mellitus with other specified complication: Secondary | ICD-10-CM

## 2023-07-17 DIAGNOSIS — Z7985 Long-term (current) use of injectable non-insulin antidiabetic drugs: Secondary | ICD-10-CM | POA: Diagnosis not present

## 2023-07-17 NOTE — Assessment & Plan Note (Signed)
A1c and foot exam completed today. Will increase Trulicity to 4.5 mg weekly if needed, pt would like to stop Metformin. Pending lab results. Follow up in 3 months if medication changes are needed. Recommend heart healthy diet such as Mediterranean diet with whole grains, fruits, vegetable, fish, lean meats, nuts, and olive oil. Limit salt. Encouraged moderate walking, 3-5 times/week for 30-50 minutes each session. Aim for at least 150 minutes.week. Goal should be pace of 3 miles/hours, or walking 1.5 miles in 30 minutes. Seek medical care for urinary frequency, extreme thirst, vision changes, lightheadedness, dizziness.

## 2023-07-17 NOTE — Progress Notes (Signed)
Subjective:  HPI: Mariah [name] is a 47 y.o. female presenting on 07/17/2023 for Follow-up (6 months (around 07/17/2023) - J[facility]\\\/)   HPI Patient is in today for diabetes follow-up.  DIABETES Hypoglycemic episodes:no Polydipsia/polyuria: no Visual disturbance: no Chest pain: no Paresthesias: no Glucose Monitoring: yes  Accucheck frequency: Daily  Fasting glucose: 120-180  Post prandial:  Evening:  Before meals: Taking Insulin?: no  Long acting insulin:  Short acting insulin: Blood Pressure Monitoring: not checking Retinal Examination: Up to Date Foot Exam: Not up to Date Diabetic Education: Not Completed Pneumovax: unknown Influenza:  declines Aspirin: no   Review of Systems  All other systems reviewed and are negative.   Relevant past medical history reviewed and updated as indicated.   Past Medical History:  Diagnosis Date   Abnormal Pap smear 09/26/10   ASCUS   Abnormal uterine bleeding    Asthma    Albuterol Inhaler last used 7/12   Diabetes (HCC)    Diabetes mellitus without complication (HCC)    Hypercholesteremia    Morbid obesity (HCC)    Routine adult health maintenance 07/25/2022   Simple endometrial hyperplasia without atypia      Past Surgical History:  Procedure Laterality Date   APPENDECTOMY     CYSTECTOMY  1989   ENDOMETRIAL BIOPSY  02/13/11   AUB   HYSTEROSCOPY WITH D & C  04/16/2011   Procedure: DILATATION AND CURETTAGE (D&C) /HYSTEROSCOPY;  Surgeon: Tereso Newcomer, MD;  Location: [facility];  Service: Gynecology;  Laterality: N/A;  Diagnostic   polyp removal  2010    Allergies and medications reviewed and updated.   Current Outpatient Medications:    atorvastatin (LIPITOR) 80 MG tablet, Take 1 tablet (80 mg total) by mouth daily., Disp: 90 tablet, Rfl: 1   benzonatate (TESSALON) 200 MG capsule, Take 1 capsule (200 mg total) by mouth 2 (two) times daily as needed for cough., Disp: 20 capsule, Rfl: 0   Continuous Glucose Sensor  (FREESTYLE LIBRE 3 SENSOR) MISC, 1 Application by Does not apply route every 14 (fourteen) days. Place 1 sensor on the skin every 14 days. Use to check glucose continuously, Disp: 1 each, Rfl: 3   Dulaglutide (TRULICITY) 3 MG/0.5ML SOPN, Inject 3 mg as directed once a week., Disp: 2 mL, Rfl: 1   glipiZIDE (GLUCOTROL) 10 MG tablet, Take 1 tablet (10 mg total) by mouth 2 (two) times daily before a meal., Disp: 180 tablet, Rfl: 1   lisinopril (ZESTRIL) 2.5 MG tablet, Take 1 tablet by mouth once daily, Disp: 90 tablet, Rfl: 0   megestrol (MEGACE) 40 MG tablet, TAKE 1 TABLET BY MOUTH ONCE DAILY -  CAN  INCREASE  TO  2  TABLETS  EVERY  DAY  IN  THE  EVENT  OF  HEAVY  BLEEDING, Disp: 90 tablet, Rfl: 3   metFORMIN (GLUCOPHAGE) 1000 MG tablet, Take 1 tablet (1,000 mg total) by mouth 2 (two) times daily with a meal., Disp: 180 tablet, Rfl: 1   Multiple Vitamin (MULTIVITAMIN WITH MINERALS) TABS tablet, Take 1 tablet by mouth daily., Disp: , Rfl:    Multiple Vitamins-Minerals (AIRBORNE PO), Take by mouth., Disp: , Rfl:    nicotine (NICODERM CQ - DOSED IN MG/24 HOURS) 21 mg/24hr patch, Place 1 patch (21 mg total) onto the skin daily., Disp: 28 patch, Rfl: 1   Dulaglutide (TRULICITY) 1.5 MG/0.5ML SOPN, Inject 1.5 mg into the skin once a week. (Patient not taking: Reported on 07/17/2023), Disp: 6 mL,  Rfl: 1   fluconazole (DIFLUCAN) 150 MG tablet, Take 1 tablet (150 mg total) by mouth daily. Repeat in 24 hours if needed (Patient not taking: Reported on 07/17/2023), Disp: 2 tablet, Rfl: 2  Allergies  Allergen Reactions   Farxiga [Dapagliflozin] Itching    Objective:   BP 120/68   Pulse (!) 107   Temp 98.3 F (36.8 C) (Oral)   Ht 5\' 8"  (1.727 m)   Wt 247 lb (112 kg)   LMP  (Approximate) Comment: per pt don't have any periods.  SpO2 98%   BMI 37.56 kg/m      07/17/2023    3:13 PM 07/10/2023    3:45 PM 07/01/2023    2:50 PM  Vitals with BMI  Height 5\' 8"   5\' 8"   Weight 247 lbs  247 lbs  BMI 37.56   37.56  Systolic 120 132 595  Diastolic 68 84 78  Pulse 107 80 109     Physical Exam Vitals and nursing note reviewed.  Constitutional:      Appearance: Normal appearance. She is normal weight.  HENT:     Head: Normocephalic and atraumatic.  Cardiovascular:     Rate and Rhythm: Normal rate and regular rhythm.     Pulses: Normal pulses.     Heart sounds: Normal heart sounds.  Pulmonary:     Effort: Pulmonary effort is normal.     Breath sounds: Normal breath sounds.  Skin:    General: Skin is warm and dry.  Neurological:     General: No focal deficit present.     Mental Status: She is alert and oriented to person, place, and time. Mental status is at baseline.  Psychiatric:        Mood and Affect: Mood normal.        Behavior: Behavior normal.        Thought Content: Thought content normal.        Judgment: Judgment normal.     Assessment & Plan:  Type 2 diabetes mellitus with obesity (HCC) Assessment & Plan: A1c and foot exam completed today. Will increase Trulicity to 4.5 mg weekly if needed, pt would like to stop Metformin. Pending lab results. Follow up in 3 months if medication changes are needed. Recommend heart healthy diet such as Mediterranean diet with whole grains, fruits, vegetable, fish, lean meats, nuts, and olive oil. Limit salt. Encouraged moderate walking, 3-5 times/week for 30-50 minutes each session. Aim for at least 150 minutes.week. Goal should be pace of 3 miles/hours, or walking 1.5 miles in 30 minutes. Seek medical care for urinary frequency, extreme thirst, vision changes, lightheadedness, dizziness.    Orders: -     COMPLETE METABOLIC PANEL WITH GFR -     Hemoglobin A1c -     Microalbumin / creatinine urine ratio     Follow up plan: Return in about 3 months (around 10/17/2023) for chronic follow-up with labs 1 week prior.  Park Meo, FNP

## 2023-07-18 ENCOUNTER — Other Ambulatory Visit: Payer: Self-pay | Admitting: Family Medicine

## 2023-07-18 LAB — MICROALBUMIN / CREATININE URINE RATIO
Creatinine, Urine: 135 mg/dL (ref 20–275)
Microalb Creat Ratio: 4 mg/g{creat} (ref <30)
Microalb, Ur: 0.6 mg/dL

## 2023-07-18 LAB — COMPLETE METABOLIC PANEL WITH GFR
AG Ratio: 1.6 (calc) (ref 1.0–2.5)
ALT: 12 U/L (ref 6–29)
AST: 11 U/L (ref 10–35)
Albumin: 4.3 g/dL (ref 3.6–5.1)
Alkaline phosphatase (APISO): 79 U/L (ref 31–125)
BUN: 17 mg/dL (ref 7–25)
CO2: 22 mmol/L (ref 20–32)
Calcium: 10.2 mg/dL (ref 8.6–10.2)
Chloride: 107 mmol/L (ref 98–110)
Creat: 0.85 mg/dL (ref 0.50–0.99)
Globulin: 2.7 g/dL (ref 1.9–3.7)
Glucose, Bld: 82 mg/dL (ref 65–99)
Potassium: 4.5 mmol/L (ref 3.5–5.3)
Sodium: 139 mmol/L (ref 135–146)
Total Bilirubin: 0.3 mg/dL (ref 0.2–1.2)
Total Protein: 7 g/dL (ref 6.1–8.1)
eGFR: 86 mL/min/{1.73_m2} (ref >=60)

## 2023-07-18 LAB — HEMOGLOBIN A1C
Hgb A1c MFr Bld: 9.1 %{Hb} — ABNORMAL HIGH (ref <5.7)
Mean Plasma Glucose: 214 mg/dL
eAG (mmol/L): 11.9 mmol/L

## 2023-07-18 MED ORDER — TRULICITY 4.5 MG/0.5ML ~~LOC~~ SOAJ: 4.5000 mg | SUBCUTANEOUS | 3 refills | Status: DC

## 2023-08-03 ENCOUNTER — Other Ambulatory Visit: Payer: Self-pay | Admitting: Family Medicine

## 2023-08-16 ENCOUNTER — Other Ambulatory Visit: Payer: Self-pay | Admitting: Family Medicine

## 2023-08-16 DIAGNOSIS — E1169 Type 2 diabetes mellitus with other specified complication: Secondary | ICD-10-CM

## 2023-08-18 ENCOUNTER — Encounter: Payer: Self-pay | Admitting: Family Medicine

## 2023-08-18 DIAGNOSIS — E1169 Type 2 diabetes mellitus with other specified complication: Secondary | ICD-10-CM

## 2023-08-19 ENCOUNTER — Other Ambulatory Visit: Payer: Self-pay

## 2023-08-19 DIAGNOSIS — E1169 Type 2 diabetes mellitus with other specified complication: Secondary | ICD-10-CM

## 2023-08-19 MED ORDER — LISINOPRIL 2.5 MG PO TABS: 2.5000 mg | ORAL_TABLET | Freq: Every day | ORAL | 1 refills | Status: DC

## 2023-08-19 MED ORDER — TRULICITY 4.5 MG/0.5ML ~~LOC~~ SOAJ: 4.5000 mg | SUBCUTANEOUS | 3 refills | Status: AC

## 2023-08-19 MED ORDER — ATORVASTATIN CALCIUM 80 MG PO TABS: 80.0000 mg | ORAL_TABLET | Freq: Every day | ORAL | 1 refills | Status: DC

## 2023-08-19 MED ORDER — METFORMIN HCL 1000 MG PO TABS: 1000.0000 mg | ORAL_TABLET | Freq: Two times a day (BID) | ORAL | 1 refills | Status: DC

## 2023-08-19 MED ORDER — GLIPIZIDE 10 MG PO TABS: 10.0000 mg | ORAL_TABLET | Freq: Two times a day (BID) | ORAL | 1 refills | Status: AC

## 2023-08-19 MED ORDER — LISINOPRIL 2.5 MG PO TABS: 2.5000 mg | ORAL_TABLET | Freq: Every day | ORAL | 0 refills | Status: DC

## 2023-08-19 MED ORDER — GLIPIZIDE 10 MG PO TABS: 10.0000 mg | ORAL_TABLET | Freq: Two times a day (BID) | ORAL | 1 refills | Status: DC

## 2023-09-12 ENCOUNTER — Ambulatory Visit: Payer: BC Managed Care – PPO | Admitting: Family Medicine

## 2023-09-12 ENCOUNTER — Encounter: Payer: Self-pay | Admitting: Family Medicine

## 2023-09-12 VITALS — BP 122/70 | HR 122 | Temp 99.9°F | Ht 68.0 in | Wt 239.0 lb

## 2023-09-12 DIAGNOSIS — J069 Acute upper respiratory infection, unspecified: Secondary | ICD-10-CM

## 2023-09-12 LAB — INFLUENZA A AND B AG, IMMUNOASSAY
INFLUENZA A ANTIGEN: NOT DETECTED
INFLUENZA B ANTIGEN: NOT DETECTED

## 2023-09-12 MED ORDER — HYDROCODONE BIT-HOMATROP MBR 5-1.5 MG/5ML PO SOLN: 5.0000 mL | Freq: Three times a day (TID) | ORAL | 0 refills | Status: AC | PRN

## 2023-09-12 NOTE — Assessment & Plan Note (Signed)
 Flu A/B negative. Will take home covid test and report to office if positive. Reassured patient that symptoms and exam findings are most consistent with a viral upper respiratory infection and explained lack of efficacy of antibiotics against viruses.  Discussed expected course and features suggestive of secondary bacterial infection.  Continue supportive care. Increase fluid intake with water or electrolyte solution like pedialyte. Encouraged acetaminophen  as needed for fever/pain. Encouraged salt water gargling, chloraseptic spray and throat lozenges. Encouraged OTC guaifenesin . Encouraged saline sinus flushes and/or neti with humidified air.

## 2023-09-12 NOTE — Progress Notes (Signed)
 Subjective:  HPI: Mariah [name] is a 48 y.o. female presenting on 09/12/2023 for Acute Visit (feeling really bad yesterday coughing yesterday, aching in body, head hurts and nose is stuffy)   HPI Patient is in today for 1 day cough with headache, body aches, chills, watery rhinorrhea. Denies SOB, wheezing, pleurisy, N/V/D, sinus pressure or pain No home covid test, no known exposures.  Has tried Nyquil  Review of Systems  All other systems reviewed and are negative.   Relevant past medical history reviewed and updated as indicated.   Past Medical History:  Diagnosis Date   Abnormal Pap smear 09/26/10   ASCUS   Abnormal uterine bleeding    Asthma    Albuterol  Inhaler last used 7/12   Diabetes (HCC)    Diabetes mellitus without complication (HCC)    Hypercholesteremia    Morbid obesity (HCC)    Routine adult health maintenance 07/25/2022   Simple endometrial hyperplasia without atypia      Past Surgical History:  Procedure Laterality Date   APPENDECTOMY     CYSTECTOMY  1989   ENDOMETRIAL BIOPSY  02/13/11   AUB   HYSTEROSCOPY WITH D & C  04/16/2011   Procedure: DILATATION AND CURETTAGE (D&C) /HYSTEROSCOPY;  Surgeon: Gloris DELENA Hugger, MD;  Location: [facility];  Service: Gynecology;  Laterality: N/A;  Diagnostic   polyp removal  2010    Allergies and medications reviewed and updated.   Current Outpatient Medications:    atorvastatin  (LIPITOR) 80 MG tablet, Take 1 tablet (80 mg total) by mouth daily., Disp: 90 tablet, Rfl: 1   Continuous Glucose Sensor (FREESTYLE LIBRE 3 SENSOR) MISC, 1 Application by Does not apply route every 14 (fourteen) days. Place 1 sensor on the skin every 14 days. Use to check glucose continuously, Disp: 1 each, Rfl: 3   Dulaglutide  (TRULICITY ) 4.5 MG/0.5ML SOAJ, Inject 4.5 mg into the skin once a week., Disp: 2 mL, Rfl: 3   glipiZIDE  (GLUCOTROL ) 10 MG tablet, Take 1 tablet (10 mg total) by mouth 2 (two) times daily before a meal., Disp: 180  tablet, Rfl: 1   HYDROcodone  bit-homatropine (HYCODAN) 5-1.5 MG/5ML syrup, Take 5 mLs by mouth every 8 (eight) hours as needed for cough., Disp: 120 mL, Rfl: 0   lisinopril  (ZESTRIL ) 2.5 MG tablet, Take 1 tablet (2.5 mg total) by mouth daily., Disp: 90 tablet, Rfl: 1   megestrol  (MEGACE ) 40 MG tablet, TAKE 1 TABLET BY MOUTH ONCE DAILY -  CAN  INCREASE  TO  2  TABLETS  EVERY  DAY  IN  THE  EVENT  OF  HEAVY  BLEEDING, Disp: 90 tablet, Rfl: 3   metFORMIN  (GLUCOPHAGE ) 1000 MG tablet, Take 1 tablet (1,000 mg total) by mouth 2 (two) times daily with a meal., Disp: 180 tablet, Rfl: 1   Multiple Vitamin (MULTIVITAMIN WITH MINERALS) TABS tablet, Take 1 tablet by mouth daily., Disp: , Rfl:    Multiple Vitamins-Minerals (AIRBORNE PO), Take by mouth., Disp: , Rfl:    nicotine  (NICODERM CQ  - DOSED IN MG/24 HOURS) 21 mg/24hr patch, Place 1 patch (21 mg total) onto the skin daily., Disp: 28 patch, Rfl: 1   benzonatate  (TESSALON ) 200 MG capsule, Take 1 capsule (200 mg total) by mouth 2 (two) times daily as needed for cough. (Patient not taking: Reported on 09/12/2023), Disp: 20 capsule, Rfl: 0   fluconazole  (DIFLUCAN ) 150 MG tablet, Take 1 tablet (150 mg total) by mouth daily. Repeat in 24 hours if needed (Patient not  taking: Reported on 09/12/2023), Disp: 2 tablet, Rfl: 2  Allergies  Allergen Reactions   Farxiga  [Dapagliflozin ] Itching    Objective:   BP 122/70   Pulse (!) 122   Temp 99.9 F (37.7 C) (Oral)   Ht 5' 8 (1.727 m)   Wt 239 lb (108.4 kg)   LMP  (Approximate)   SpO2 97%   BMI 36.34 kg/m      09/12/2023   11:37 AM 07/17/2023    3:13 PM 07/10/2023    3:45 PM  Vitals with BMI  Height 5' 8 5' 8   Weight 239 lbs 247 lbs   BMI 36.35 37.56   Systolic 122 120 867  Diastolic 70 68 84  Pulse 122 107 80     Physical Exam Vitals and nursing note reviewed.  Constitutional:      Appearance: Normal appearance. She is normal weight.  HENT:     Head: Normocephalic and atraumatic.     Right  Ear: Tympanic membrane, ear canal and external ear normal.     Left Ear: Tympanic membrane, ear canal and external ear normal.     Nose: Congestion present.     Right Sinus: No maxillary sinus tenderness or frontal sinus tenderness.     Left Sinus: No maxillary sinus tenderness or frontal sinus tenderness.     Mouth/Throat:     Mouth: Mucous membranes are moist.     Pharynx: Oropharynx is clear.  Eyes:     Conjunctiva/sclera: Conjunctivae normal.  Cardiovascular:     Rate and Rhythm: Normal rate and regular rhythm.     Pulses: Normal pulses.     Heart sounds: Normal heart sounds.  Pulmonary:     Effort: Pulmonary effort is normal.     Breath sounds: Normal breath sounds.  Musculoskeletal:     Cervical back: No tenderness.  Lymphadenopathy:     Cervical: No cervical adenopathy.  Skin:    General: Skin is warm and dry.  Neurological:     General: No focal deficit present.     Mental Status: She is alert and oriented to person, place, and time. Mental status is at baseline.  Psychiatric:        Mood and Affect: Mood normal.        Behavior: Behavior normal.        Thought Content: Thought content normal.        Judgment: Judgment normal.     Assessment & Plan:  Viral URI with cough Assessment & Plan: Flu A/B negative. Will take home covid test and report to office if positive. Reassured patient that symptoms and exam findings are most consistent with a viral upper respiratory infection and explained lack of efficacy of antibiotics against viruses.  Discussed expected course and features suggestive of secondary bacterial infection.  Continue supportive care. Increase fluid intake with water or electrolyte solution like pedialyte. Encouraged acetaminophen  as needed for fever/pain. Encouraged salt water gargling, chloraseptic spray and throat lozenges. Encouraged OTC guaifenesin . Encouraged saline sinus flushes and/or neti with humidified air.     Other orders -     HYDROcodone   Bit-Homatrop MBr; Take 5 mLs by mouth every 8 (eight) hours as needed for cough.  Dispense: 120 mL; Refill: 0     Follow up plan: Return if symptoms worsen or fail to improve.  Jeoffrey GORMAN Barrio, FNP

## 2023-09-12 NOTE — Patient Instructions (Signed)
 Your symptoms and exam findings are most consistent with a viral upper respiratory infection. These usually run their course in 5-7 days. Unfortunately, antibiotics don't work against viruses and just increase your risk of other issues such as diarrhea, yeast infections, and resistant infections.  If your symptoms last longer than 10 days and/or you start feeling worse with facial pain, high fever, cough, shortness of breath or start feeling significantly worse, please call us right away to be further evaluated.  Some things that can make you feel better are: - Increased rest - Increasing fluid with water/sugar free electrolytes - Acetaminophen as needed for fever/pain.  - Salt water gargling, chloraseptic spray and throat lozenges - OTC guaifenesin (Mucinex).  - Saline sinus flushes or a neti pot.  - Humidifying the air.

## 2023-09-12 NOTE — Addendum Note (Signed)
 Addended by: Arta Silence on: 09/12/2023 12:22 PM   Modules accepted: Orders

## 2023-10-11 ENCOUNTER — Other Ambulatory Visit: Payer: BC Managed Care – PPO

## 2023-10-11 DIAGNOSIS — E1169 Type 2 diabetes mellitus with other specified complication: Secondary | ICD-10-CM

## 2023-10-11 DIAGNOSIS — E66813 Obesity, class 3: Secondary | ICD-10-CM

## 2023-10-11 DIAGNOSIS — E782 Mixed hyperlipidemia: Secondary | ICD-10-CM

## 2023-10-11 DIAGNOSIS — Z0000 Encounter for general adult medical examination without abnormal findings: Secondary | ICD-10-CM

## 2023-10-12 LAB — COMPLETE METABOLIC PANEL WITH GFR
AG Ratio: 1.7 (calc) (ref 1.0–2.5)
ALT: 10 U/L (ref 6–29)
AST: 10 U/L (ref 10–35)
Albumin: 4.3 g/dL (ref 3.6–5.1)
Alkaline phosphatase (APISO): 71 U/L (ref 31–125)
BUN: 13 mg/dL (ref 7–25)
CO2: 24 mmol/L (ref 20–32)
Calcium: 10 mg/dL (ref 8.6–10.2)
Chloride: 107 mmol/L (ref 98–110)
Creat: 0.82 mg/dL (ref 0.50–0.99)
Globulin: 2.6 g/dL (ref 1.9–3.7)
Glucose, Bld: 82 mg/dL (ref 65–99)
Potassium: 4.6 mmol/L (ref 3.5–5.3)
Sodium: 139 mmol/L (ref 135–146)
Total Bilirubin: 0.4 mg/dL (ref 0.2–1.2)
Total Protein: 6.9 g/dL (ref 6.1–8.1)
eGFR: 89 mL/min/{1.73_m2} (ref >=60)

## 2023-10-12 LAB — CBC WITH DIFFERENTIAL/PLATELET
Absolute Lymphocytes: 3430 {cells}/uL (ref 850–3900)
Absolute Monocytes: 474 {cells}/uL (ref 200–950)
Basophils Absolute: 41 {cells}/uL (ref 0–200)
Basophils Relative: 0.4 %
Eosinophils Absolute: 464 {cells}/uL (ref 15–500)
Eosinophils Relative: 4.5 %
HCT: 39.8 % (ref 35.0–45.0)
Hemoglobin: 12.8 g/dL (ref 11.7–15.5)
MCH: 26.9 pg — ABNORMAL LOW (ref 27.0–33.0)
MCHC: 32.2 g/dL (ref 32.0–36.0)
MCV: 83.6 fL (ref 80.0–100.0)
MPV: 9.4 fL (ref 7.5–12.5)
Monocytes Relative: 4.6 %
Neutro Abs: 5892 {cells}/uL (ref 1500–7800)
Neutrophils Relative %: 57.2 %
Platelets: 361 10*3/uL (ref 140–400)
RBC: 4.76 10*6/uL (ref 3.80–5.10)
RDW: 13.1 % (ref 11.0–15.0)
Total Lymphocyte: 33.3 %
WBC: 10.3 10*3/uL (ref 3.8–10.8)

## 2023-10-12 LAB — LIPID PANEL
Cholesterol: 120 mg/dL (ref <200)
HDL: 31 mg/dL — ABNORMAL LOW (ref >=50)
LDL Cholesterol (Calc): 78 mg/dL
Non-HDL Cholesterol (Calc): 89 mg/dL (ref <130)
Total CHOL/HDL Ratio: 3.9 (calc) (ref <5.0)
Triglycerides: 38 mg/dL (ref <150)

## 2023-10-12 LAB — PROTEIN / CREATININE RATIO, URINE
Creatinine, Urine: 96 mg/dL (ref 20–275)
Protein/Creat Ratio: 115 mg/g{creat} (ref 24–184)
Protein/Creatinine Ratio: 0.115 mg/mg{creat} (ref 0.024–0.184)
Total Protein, Urine: 11 mg/dL (ref 5–24)

## 2023-10-17 ENCOUNTER — Ambulatory Visit: Payer: BC Managed Care – PPO | Admitting: Family Medicine

## 2023-10-17 ENCOUNTER — Encounter: Payer: Self-pay | Admitting: Family Medicine

## 2023-10-17 VITALS — BP 120/74 | HR 108 | Temp 97.8°F | Ht 68.0 in | Wt 236.0 lb

## 2023-10-17 DIAGNOSIS — Z0001 Encounter for general adult medical examination with abnormal findings: Secondary | ICD-10-CM

## 2023-10-17 DIAGNOSIS — E1169 Type 2 diabetes mellitus with other specified complication: Secondary | ICD-10-CM

## 2023-10-17 DIAGNOSIS — Z0000 Encounter for general adult medical examination without abnormal findings: Secondary | ICD-10-CM | POA: Insufficient documentation

## 2023-10-17 DIAGNOSIS — M7661 Achilles tendinitis, right leg: Secondary | ICD-10-CM

## 2023-10-17 DIAGNOSIS — Z7984 Long term (current) use of oral hypoglycemic drugs: Secondary | ICD-10-CM

## 2023-10-17 DIAGNOSIS — E669 Obesity, unspecified: Secondary | ICD-10-CM

## 2023-10-17 DIAGNOSIS — R000 Tachycardia, unspecified: Secondary | ICD-10-CM | POA: Insufficient documentation

## 2023-10-17 NOTE — Assessment & Plan Note (Signed)
Continue bracing, rest, ice, NSAIDs, and stretching exercises per handout.

## 2023-10-17 NOTE — Assessment & Plan Note (Signed)
Well controlled on current regimen. Return for A1c. Continue Trulicity 4.5mg  weekly. A1c and uACR UTD. Foot exam UTD. Vaccines declines. Retinal eye exam UTD. Recommend heart healthy diet such as Mediterranean diet with whole grains, fruits, vegetable, fish, lean meats, nuts, and olive oil. Limit salt. Encouraged moderate walking, 3-5 times/week for 30-50 minutes each session. Aim for at least 150 minutes.week. Goal should be pace of 3 miles/hours, or walking 1.5 miles in 30 minutes. Seek medical care for urinary frequency, extreme thirst, vision changes, lightheadedness, dizziness.

## 2023-10-17 NOTE — Progress Notes (Signed)
Complete physical exam  Patient: Mariah [name]   DOB: 1976/01/19   48 y.o. Female  MRN: [id number]  Subjective:    Chief Complaint  Patient presents with   Follow-up    3 month f/u    Mariah [name] is a 48 y.o. female who presents today for a complete physical exam. She reports consuming a general diet. The patient does not participate in regular exercise at present. She generally feels well. She reports sleeping well. She does have additional problems to discuss today. She reports several weeks of posterior ankle pain when she is working. This occurs a few days a week and is relieved with rest and bracing. It is worse in AM. No injury, redness, swelling.   DIABETES Hypoglycemic episodes:no Polydipsia/polyuria: no Visual disturbance: no Chest pain: no Paresthesias: no Glucose Monitoring: yes  Accucheck frequency: Daily  Fasting glucose: 90-110  Post prandial:  Evening:  Before meals: Taking Insulin?: no  Long acting insulin:  Short acting insulin: Blood Pressure Monitoring: not checking Retinal Examination: Up to Date Foot Exam: Up to Date Diabetic Education: Completed Pneumovax:  declines Influenza:  declines Aspirin: no    Most recent fall risk assessment:    09/19/2022    4:07 PM  Fall Risk   Falls in the past year? 0  Number falls in past yr: 0  Injury with Fall? 0     Most recent depression screenings:    09/12/2023   11:45 AM 07/01/2023    4:05 PM  PHQ 2/9 Scores  PHQ - 2 Score 0 0  PHQ- 9 Score 1 0    Vision:Within last year and Dental: No current dental problems and Receives regular dental care  Patient Active Problem List   Diagnosis Date Noted   Physical exam, annual 10/17/2023   Tachycardia 10/17/2023   Achilles tendinitis of right lower extremity 10/17/2023   Normal cytology on pap smear with positive HRHPV, but negative HPV 16 and 18/45 on 06/11/23 06/13/2023   Type 2 diabetes mellitus with obesity (HCC) 01/14/2023   Blurred  vision, right eye 11/26/2022   Viral URI with cough 08/22/2022   Murmur 07/25/2022   Diabetes mellitus without complication (HCC) 07/25/2022   Routine adult health maintenance 07/25/2022   Hyperlipidemia 06/03/2017   DM (diabetes mellitus), type 2, uncontrolled 06/03/2017   Class 3 obesity 06/03/2017   Tobacco dependence 06/03/2017   History of simple endometrial hyperplasia without atypia 03/15/2011   Past Medical History:  Diagnosis Date   Abnormal Pap smear 09/26/10   ASCUS   Abnormal uterine bleeding    Asthma    Albuterol Inhaler last used 7/12   Diabetes (HCC)    Diabetes mellitus without complication (HCC)    Hypercholesteremia    Morbid obesity (HCC)    Routine adult health maintenance 07/25/2022   Simple endometrial hyperplasia without atypia    Past Surgical History:  Procedure Laterality Date   APPENDECTOMY     CYSTECTOMY  1989   ENDOMETRIAL BIOPSY  02/13/11   AUB   HYSTEROSCOPY WITH D & C  04/16/2011   Procedure: DILATATION AND CURETTAGE (D&C) /HYSTEROSCOPY;  Surgeon: Tereso Newcomer, MD;  Location: [facility];  Service: Gynecology;  Laterality: N/A;  Diagnostic   polyp removal  2010   Social History   Tobacco Use   Smoking status: Every Day    Current packs/day: 1.00    Average packs/day: 1 pack/day for 14.0 years (14.0 ttl pk-yrs)    Types: Cigarettes  Smokeless tobacco: Never  Vaping Use   Vaping status: Never Used  Substance Use Topics   Alcohol use: Yes    Comment: occassonially   Drug use: No   Family History  Problem Relation Age of Onset   Diabetes Mother    Heart disease Mother    Hyperlipidemia Mother    Diabetes Maternal Grandmother    Diabetes Maternal Grandfather    Cancer Paternal Grandmother    Breast cancer Paternal Grandmother    Allergies  Allergen Reactions   Farxiga [Dapagliflozin] Itching      Patient Care Team: Park Meo, FNP as PCP - General (Family Medicine)   Outpatient Medications Prior to Visit  Medication  Sig   atorvastatin (LIPITOR) 80 MG tablet Take 1 tablet (80 mg total) by mouth daily.   Continuous Glucose Sensor (FREESTYLE LIBRE 3 SENSOR) MISC 1 Application by Does not apply route every 14 (fourteen) days. Place 1 sensor on the skin every 14 days. Use to check glucose continuously   Dulaglutide (TRULICITY) 4.5 MG/0.5ML SOAJ Inject 4.5 mg into the skin once a week.   glipiZIDE (GLUCOTROL) 10 MG tablet Take 1 tablet (10 mg total) by mouth 2 (two) times daily before a meal.   lisinopril (ZESTRIL) 2.5 MG tablet Take 1 tablet (2.5 mg total) by mouth daily.   megestrol (MEGACE) 40 MG tablet TAKE 1 TABLET BY MOUTH ONCE DAILY -  CAN  INCREASE  TO  2  TABLETS  EVERY  DAY  IN  THE  EVENT  OF  HEAVY  BLEEDING   metFORMIN (GLUCOPHAGE) 1000 MG tablet Take 1 tablet (1,000 mg total) by mouth 2 (two) times daily with a meal.   Multiple Vitamin (MULTIVITAMIN WITH MINERALS) TABS tablet Take 1 tablet by mouth daily.   Multiple Vitamins-Minerals (AIRBORNE PO) Take by mouth.   nicotine (NICODERM CQ - DOSED IN MG/24 HOURS) 21 mg/24hr patch Place 1 patch (21 mg total) onto the skin daily.   benzonatate (TESSALON) 200 MG capsule Take 1 capsule (200 mg total) by mouth 2 (two) times daily as needed for cough. (Patient not taking: Reported on 10/17/2023)   fluconazole (DIFLUCAN) 150 MG tablet Take 1 tablet (150 mg total) by mouth daily. Repeat in 24 hours if needed (Patient not taking: Reported on 10/17/2023)   HYDROcodone bit-homatropine (HYCODAN) 5-1.5 MG/5ML syrup Take 5 mLs by mouth every 8 (eight) hours as needed for cough. (Patient not taking: Reported on 10/17/2023)   No facility-administered medications prior to visit.    Review of Systems  Constitutional: Negative.   HENT: Negative.    Eyes: Negative.   Respiratory: Negative.    Cardiovascular: Negative.   Gastrointestinal: Negative.   Genitourinary: Negative.   Musculoskeletal: Negative.        Achilles pain  Skin: Negative.   Neurological: Negative.    Endo/Heme/Allergies: Negative.   Psychiatric/Behavioral: Negative.    All other systems reviewed and are negative.         Objective:     BP 120/74   Pulse (!) 108   Temp 97.8 F (36.6 C) (Oral)   Ht 5\' 8"  (1.727 m)   Wt 236 lb (107 kg)   LMP  (Approximate)   SpO2 98%   BMI 35.88 kg/m  BP Readings from Last 3 Encounters:  10/17/23 120/74  09/12/23 122/70  07/17/23 120/68   Wt Readings from Last 3 Encounters:  10/17/23 236 lb (107 kg)  09/12/23 239 lb (108.4 kg)  07/17/23 247 lb (112  kg)      Physical Exam Vitals and nursing note reviewed.  Constitutional:      Appearance: Normal appearance. She is obese.  HENT:     Head: Normocephalic and atraumatic.     Right Ear: Tympanic membrane, ear canal and external ear normal.     Left Ear: Tympanic membrane, ear canal and external ear normal.     Nose: Nose normal.     Mouth/Throat:     Mouth: Mucous membranes are moist.     Pharynx: Oropharynx is clear.  Eyes:     Extraocular Movements: Extraocular movements intact.     Conjunctiva/sclera: Conjunctivae normal.     Pupils: Pupils are equal, round, and reactive to light.  Cardiovascular:     Rate and Rhythm: Normal rate and regular rhythm.     Pulses: Normal pulses.     Heart sounds: Normal heart sounds.  Pulmonary:     Effort: Pulmonary effort is normal.     Breath sounds: Normal breath sounds.  Abdominal:     General: Bowel sounds are normal.     Palpations: Abdomen is soft.  Musculoskeletal:        General: Normal range of motion.     Cervical back: Normal range of motion and neck supple.  Skin:    General: Skin is warm and dry.     Capillary Refill: Capillary refill takes less than 2 seconds.  Neurological:     General: No focal deficit present.     Mental Status: She is alert and oriented to person, place, and time. Mental status is at baseline.  Psychiatric:        Mood and Affect: Mood normal.        Behavior: Behavior normal.        Thought  Content: Thought content normal.        Judgment: Judgment normal.      No results found for any visits on 10/17/23. Last CBC Lab Results  Component Value Date   WBC 10.3 10/11/2023   HGB 12.8 10/11/2023   HCT 39.8 10/11/2023   MCV 83.6 10/11/2023   MCH 26.9 (L) 10/11/2023   RDW 13.1 10/11/2023   PLT 361 10/11/2023   Last metabolic panel Lab Results  Component Value Date   GLUCOSE 82 10/11/2023   NA 139 10/11/2023   K 4.6 10/11/2023   CL 107 10/11/2023   CO2 24 10/11/2023   BUN 13 10/11/2023   CREATININE 0.82 10/11/2023   EGFR 89 10/11/2023   CALCIUM 10.0 10/11/2023   PROT 6.9 10/11/2023   ALBUMIN 4.6 07/04/2022   LABGLOB 3.0 07/04/2022   AGRATIO 1.5 07/04/2022   BILITOT 0.4 10/11/2023   ALKPHOS 95 07/04/2022   AST 10 10/11/2023   ALT 10 10/11/2023   Last lipids Lab Results  Component Value Date   CHOL 120 10/11/2023   HDL 31 (L) 10/11/2023   LDLCALC 78 10/11/2023   TRIG 38 10/11/2023   CHOLHDL 3.9 10/11/2023   Last hemoglobin A1c Lab Results  Component Value Date   HGBA1C 9.1 (H) 07/17/2023   Last thyroid functions Lab Results  Component Value Date   TSH 2.45 01/12/2019   Last vitamin D No results found for: "25OHVITD2", "25OHVITD3", "VD25OH" Last vitamin B12 and Folate No results found for: "VITAMINB12", "FOLATE"      Assessment & Plan:    Routine Health Maintenance and Physical Exam  Immunization History  Administered Date(s) Administered   Moderna SARS-COV2 Booster Vaccination 10/23/2020  Moderna Sars-Covid-2 Vaccination 11/14/2019, 12/16/2019   Tdap 06/03/2017    Health Maintenance  Topic Date Due   COVID-19 Vaccine (4 - 2024-25 season) 11/02/2023 (Originally 05/05/2023)   INFLUENZA VACCINE  12/02/2023 (Originally 04/04/2023)   Pneumococcal Vaccine 71-72 Years old (1 of 2 - PCV) 10/16/2024 (Originally 11/10/1982)   HEMOGLOBIN A1C  01/14/2024   Cervical Cancer Screening (Pap smear)  06/10/2024   OPHTHALMOLOGY EXAM  06/30/2024   FOOT  EXAM  07/16/2024   Diabetic kidney evaluation - eGFR measurement  10/10/2024   Diabetic kidney evaluation - Urine ACR  10/10/2024   Fecal DNA (Cologuard)  09/10/2025   DTaP/Tdap/Td (2 - Td or Tdap) 06/04/2027   Hepatitis C Screening  Completed   HIV Screening  Completed   HPV VACCINES  Aged Out    Discussed health benefits of physical activity, and encouraged her to engage in regular exercise appropriate for her age and condition.  Problem List Items Addressed This Visit     Type 2 diabetes mellitus with obesity (HCC)   Well controlled on current regimen. Return for A1c. Continue Trulicity 4.5mg  weekly. A1c and uACR UTD. Foot exam UTD. Vaccines declines. Retinal eye exam UTD. Recommend heart healthy diet such as Mediterranean diet with whole grains, fruits, vegetable, fish, lean meats, nuts, and olive oil. Limit salt. Encouraged moderate walking, 3-5 times/week for 30-50 minutes each session. Aim for at least 150 minutes.week. Goal should be pace of 3 miles/hours, or walking 1.5 miles in 30 minutes. Seek medical care for urinary frequency, extreme thirst, vision changes, lightheadedness, dizziness.        Relevant Orders   Hemoglobin A1c   Vitamin B12   Physical exam, annual   Today your medical history was reviewed and routine physical exam with labs was performed. Recommend 150 minutes of moderate intensity exercise weekly and consuming a well-balanced diet. Advised to stop smoking if a smoker, avoid smoking if a non-smoker, limit alcohol consumption to 1 drink per day for women and 2 drinks per day for men, and avoid illicit drug use. Counseled on safe sex practices and offered STI testing today. Counseled on the importance of sunscreen use. Counseled in mental health awareness and when to seek medical care. Vaccine maintenance discussed. Appropriate health maintenance items reviewed. Return to office in 1 year for annual physical exam.       Tachycardia - Primary   EKG sinus tachy. No  symptoms. She does endorse anxiety regarding todays visit. Continue to monitor. Discussed s/s to seek medical care for      Relevant Orders   EKG 12-Lead   Achilles tendinitis of right lower extremity   Continue bracing, rest, ice, NSAIDs, and stretching exercises per handout.       Return in about 6 months (around 04/15/2024) for chronic follow-up with labs 1 week prior.     Park Meo, FNP

## 2023-10-17 NOTE — Assessment & Plan Note (Signed)

## 2023-10-17 NOTE — Assessment & Plan Note (Signed)
EKG sinus tachy. No symptoms. She does endorse anxiety regarding todays visit. Continue to monitor. Discussed s/s to seek medical care for

## 2023-10-21 ENCOUNTER — Other Ambulatory Visit: Payer: BC Managed Care – PPO

## 2023-10-22 ENCOUNTER — Other Ambulatory Visit: Payer: BC Managed Care – PPO

## 2023-10-23 ENCOUNTER — Encounter: Payer: Self-pay | Admitting: Family Medicine

## 2023-10-23 ENCOUNTER — Other Ambulatory Visit: Payer: Self-pay | Admitting: Family Medicine

## 2023-10-23 DIAGNOSIS — E669 Obesity, unspecified: Secondary | ICD-10-CM

## 2023-10-23 LAB — HEMOGLOBIN A1C
Hgb A1c MFr Bld: 7.5 %{Hb} — ABNORMAL HIGH (ref <5.7)
Mean Plasma Glucose: 169 mg/dL
eAG (mmol/L): 9.3 mmol/L

## 2023-10-23 LAB — VITAMIN B12: Vitamin B-12: 251 pg/mL (ref 200–1100)

## 2023-11-13 ENCOUNTER — Other Ambulatory Visit: Payer: Self-pay | Admitting: Family Medicine

## 2023-11-13 DIAGNOSIS — E1169 Type 2 diabetes mellitus with other specified complication: Secondary | ICD-10-CM

## 2023-11-20 ENCOUNTER — Other Ambulatory Visit: Payer: Self-pay | Admitting: Obstetrics & Gynecology

## 2023-11-20 DIAGNOSIS — Z8742 Personal history of other diseases of the female genital tract: Secondary | ICD-10-CM

## 2023-12-30 ENCOUNTER — Other Ambulatory Visit: Payer: Self-pay | Admitting: Family Medicine

## 2023-12-30 DIAGNOSIS — E1169 Type 2 diabetes mellitus with other specified complication: Secondary | ICD-10-CM

## 2024-01-27 LAB — LAB REPORT - SCANNED
A1c: 8
EGFR: 100

## 2024-03-02 ENCOUNTER — Other Ambulatory Visit: Payer: Self-pay | Admitting: Family Medicine

## 2024-03-02 DIAGNOSIS — E1169 Type 2 diabetes mellitus with other specified complication: Secondary | ICD-10-CM

## 2024-03-20 ENCOUNTER — Other Ambulatory Visit: Payer: Self-pay | Admitting: Family Medicine

## 2024-03-20 DIAGNOSIS — E1169 Type 2 diabetes mellitus with other specified complication: Secondary | ICD-10-CM

## 2024-04-13 ENCOUNTER — Other Ambulatory Visit: Payer: BC Managed Care – PPO

## 2024-04-15 ENCOUNTER — Ambulatory Visit: Payer: BC Managed Care – PPO | Admitting: Family Medicine

## 2024-04-20 ENCOUNTER — Encounter: Payer: Self-pay | Admitting: Family Medicine

## 2024-04-20 ENCOUNTER — Ambulatory Visit: Admitting: Family Medicine

## 2024-04-20 VITALS — BP 119/73 | HR 103 | Temp 98.4°F | Ht 68.0 in | Wt 235.8 lb

## 2024-04-20 DIAGNOSIS — Z7985 Long-term (current) use of injectable non-insulin antidiabetic drugs: Secondary | ICD-10-CM

## 2024-04-20 DIAGNOSIS — F329 Major depressive disorder, single episode, unspecified: Secondary | ICD-10-CM

## 2024-04-20 DIAGNOSIS — F320 Major depressive disorder, single episode, mild: Secondary | ICD-10-CM | POA: Insufficient documentation

## 2024-04-20 DIAGNOSIS — E119 Type 2 diabetes mellitus without complications: Secondary | ICD-10-CM | POA: Diagnosis not present

## 2024-04-20 NOTE — Progress Notes (Signed)
 Subjective:  HPI: Mariah [name] is a 48 y.o. female presenting on 04/20/2024 for Medical Management of Chronic Issues (6 month f/u Jenel labs )   HPI Patient is in today for chronic disease management. Is being followed by [facility] for her DM. She did have labs drawn in May, A1c was 8% but has improved her diet since. Lipids, CMP normal. GMI 90d 7.1%. Time in range 71%, 22% high, 6% very high. Has been vacationing and working to get her eating back on track. Exercise includes walking 30 minutes daily some days. Endorses some worsening depression and stress related to her diagnosis of diabetes and trying to manage her BG. Denies SI/HI. Would like to speak to a therapist.   DIABETES Hypoglycemic episodes:no Polydipsia/polyuria: no Visual disturbance: no Chest pain: no Paresthesias: no Glucose Monitoring: yes  Accucheck frequency: CGM  Fasting glucose:  Post prandial:  Evening:  Before meals: Taking Insulin ?: no  Long acting insulin :  Short acting insulin : Blood Pressure Monitoring: not checking Retinal Examination: Up to Date Foot Exam: Up to Date Diabetic Education: Completed Pneumovax: Not up to Date Influenza: Up to Date Aspirin: no     09/12/2023   11:45 AM 07/01/2023    4:05 PM 01/23/2022    3:26 PM 05/19/2021    9:21 AM  GAD 7 : Generalized Anxiety Score  Nervous, Anxious, on Edge 0 0 0 0  Control/stop worrying  0 0 0  Worry too much - different things 0 0 0 0  Trouble relaxing 0 0 0 0  Restless 0 0 0 0  Easily annoyed or irritable 0 0 0 0  Afraid - awful might happen 0 0 0 0  Total GAD 7 Score  0 0 0  Anxiety Difficulty Not difficult at all Not difficult at all         09/12/2023   11:45 AM 07/01/2023    4:05 PM 09/19/2022    4:07 PM 07/25/2022    1:54 PM 01/23/2022    3:25 PM  Depression screen PHQ 2/9  Decreased Interest 0 0 0 0 0  Down, Depressed, Hopeless 0 0 0 0 0  PHQ - 2 Score 0 0 0 0 0  Altered sleeping 0 0   0  Tired, decreased energy 1  0   1  Change in appetite 0 0   0  Feeling bad or failure about yourself  0 0   0  Trouble concentrating 0 0   0  Moving slowly or fidgety/restless 0 0   0  Suicidal thoughts 0 0   0  PHQ-9 Score 1 0   1  Difficult doing work/chores Not difficult at all Not difficult at all        Review of Systems  All other systems reviewed and are negative.   Relevant past medical history reviewed and updated as indicated.   Past Medical History:  Diagnosis Date   Abnormal Pap smear 09/26/10   ASCUS   Abnormal uterine bleeding    Asthma    Albuterol  Inhaler last used 7/12   Diabetes (HCC)    Diabetes mellitus without complication (HCC)    Hypercholesteremia    Morbid obesity (HCC)    Routine adult health maintenance 07/25/2022   Simple endometrial hyperplasia without atypia      Past Surgical History:  Procedure Laterality Date   APPENDECTOMY     CYSTECTOMY  1989   ENDOMETRIAL BIOPSY  02/13/11   AUB   HYSTEROSCOPY  WITH D & C  04/16/2011   Procedure: DILATATION AND CURETTAGE (D&C) /HYSTEROSCOPY;  Surgeon: Gloris DELENA Hugger, MD;  Location: [facility];  Service: Gynecology;  Laterality: N/A;  Diagnostic   polyp removal  2010    Allergies and medications reviewed and updated.   Current Outpatient Medications:    atorvastatin  (LIPITOR) 80 MG tablet, Take 1 tablet by mouth once daily, Disp: 90 tablet, Rfl: 0   Continuous Glucose Sensor (DEXCOM G7 SENSOR) MISC, 1 Stick by Does not apply route continuous as needed (every 15 days)., Disp: , Rfl:    glipiZIDE  (GLUCOTROL ) 10 MG tablet, Take 1 tablet (10 mg total) by mouth 2 (two) times daily before a meal., Disp: 180 tablet, Rfl: 1   lisinopril  (ZESTRIL ) 2.5 MG tablet, Take 1 tablet by mouth once daily, Disp: 90 tablet, Rfl: 0   megestrol  (MEGACE ) 40 MG tablet, TAKE 1 TABLET BY MOUTH ONCE DAILY -  CAN  INCREASE  TO  2  TABLETS  EVERY  DAY  IN  THE  EVENT  OF  HEAVY  BLEEDING, Disp: 90 tablet, Rfl: 4   metFORMIN  (GLUCOPHAGE ) 1000 MG tablet, Take 1  tablet (1,000 mg total) by mouth 2 (two) times daily with a meal., Disp: 180 tablet, Rfl: 1   Multiple Vitamin (MULTIVITAMIN WITH MINERALS) TABS tablet, Take 1 tablet by mouth daily., Disp: , Rfl:    Multiple Vitamins-Minerals (AIRBORNE PO), Take by mouth., Disp: , Rfl:    benzonatate  (TESSALON ) 200 MG capsule, Take 1 capsule (200 mg total) by mouth 2 (two) times daily as needed for cough. (Patient not taking: Reported on 04/20/2024), Disp: 20 capsule, Rfl: 0   Continuous Glucose Sensor (FREESTYLE LIBRE 3 SENSOR) MISC, 1 Application by Does not apply route every 14 (fourteen) days. Place 1 sensor on the skin every 14 days. Use to check glucose continuously (Patient not taking: Reported on 04/20/2024), Disp: 1 each, Rfl: 3   Dulaglutide  (TRULICITY ) 4.5 MG/0.5ML SOAJ, Inject 4.5 mg into the skin once a week., Disp: 2 mL, Rfl: 3   fluconazole  (DIFLUCAN ) 150 MG tablet, Take 1 tablet (150 mg total) by mouth daily. Repeat in 24 hours if needed (Patient not taking: Reported on 04/20/2024), Disp: 2 tablet, Rfl: 2   HYDROcodone  bit-homatropine (HYCODAN) 5-1.5 MG/5ML syrup, Take 5 mLs by mouth every 8 (eight) hours as needed for cough. (Patient not taking: Reported on 04/20/2024), Disp: 120 mL, Rfl: 0   nicotine  (NICODERM CQ  - DOSED IN MG/24 HOURS) 21 mg/24hr patch, Place 1 patch (21 mg total) onto the skin daily. (Patient not taking: Reported on 04/20/2024), Disp: 28 patch, Rfl: 1  Allergies  Allergen Reactions   Farxiga  [Dapagliflozin ] Itching    Objective:   BP 119/73   Pulse (!) 103   Temp 98.4 F (36.9 C)   Ht 5' 8 (1.727 m)   Wt 235 lb 12.8 oz (107 kg)   LMP  (Exact Date)   SpO2 96%   BMI 35.85 kg/m      04/20/2024    2:33 PM 10/17/2023    2:54 PM 09/12/2023   11:37 AM  Vitals with BMI  Height 5' 8 5' 8 5' 8  Weight 235 lbs 13 oz 236 lbs 239 lbs  BMI 35.86 35.89 36.35  Systolic 119 120 877  Diastolic 73 74 70  Pulse 103 108 122     Physical Exam Vitals and nursing note reviewed.   Constitutional:      Appearance: Normal appearance. She is normal  weight.  HENT:     Head: Normocephalic and atraumatic.  Cardiovascular:     Rate and Rhythm: Regular rhythm. Tachycardia present.     Pulses: Normal pulses.     Heart sounds: Normal heart sounds.  Pulmonary:     Effort: Pulmonary effort is normal.     Breath sounds: Normal breath sounds.  Skin:    General: Skin is warm and dry.  Neurological:     General: No focal deficit present.     Mental Status: She is alert and oriented to person, place, and time. Mental status is at baseline.  Psychiatric:        Mood and Affect: Mood normal.        Behavior: Behavior normal.        Thought Content: Thought content normal.        Judgment: Judgment normal.     Assessment & Plan:  Diabetes mellitus treated with injections of non-insulin  medication (HCC) Assessment & Plan: A1c and uACR UTD. Foot exam UTD. Vaccines due. Retinal eye exam UTD. Recommend heart healthy diet such as Mediterranean diet with whole grains, fruits, vegetable, fish, lean meats, nuts, and olive oil. Limit salt. Encouraged moderate walking, 3-5 times/week for 30-50 minutes each session. Aim for at least 150 minutes.week. Goal should be pace of 3 miles/hours, or walking 1.5 miles in 30 minutes. Seek medical care for urinary frequency, extreme thirst, vision changes, lightheadedness, dizziness.  Requested labs be postponed due to cost, GMI 7.1% and discussed importance of diet and exercise. Will recheck prior to CPE.    Reactive depression Assessment & Plan: Denies SI/HI. Endorses stress and sadness related to her diabetes. Would like referral to therapist. Declines medication.   Orders: -     Ambulatory referral to Psychiatry     Follow up plan: Return in about 6 months (around 10/21/2024) for annual physical with labs 1 week prior.  Jeoffrey GORMAN Barrio, FNP

## 2024-04-20 NOTE — Assessment & Plan Note (Signed)
 A1c and uACR UTD. Foot exam UTD. Vaccines due. Retinal eye exam UTD. Recommend heart healthy diet such as Mediterranean diet with whole grains, fruits, vegetable, fish, lean meats, nuts, and olive oil. Limit salt. Encouraged moderate walking, 3-5 times/week for 30-50 minutes each session. Aim for at least 150 minutes.week. Goal should be pace of 3 miles/hours, or walking 1.5 miles in 30 minutes. Seek medical care for urinary frequency, extreme thirst, vision changes, lightheadedness, dizziness.  Requested labs be postponed due to cost, GMI 7.1% and discussed importance of diet and exercise. Will recheck prior to CPE.

## 2024-04-20 NOTE — Progress Notes (Signed)
 Pt asked about Pap. She stated she would schedule w/ current Ob at later date.

## 2024-04-20 NOTE — Assessment & Plan Note (Signed)
 Denies SI/HI. Endorses stress and sadness related to her diabetes. Would like referral to therapist. Declines medication.

## 2024-05-07 LAB — LAB REPORT - SCANNED
A1c: 7.7
EGFR: 99

## 2024-05-14 ENCOUNTER — Ambulatory Visit: Payer: Self-pay

## 2024-05-18 ENCOUNTER — Other Ambulatory Visit: Payer: Self-pay | Admitting: Family Medicine

## 2024-05-18 DIAGNOSIS — E1169 Type 2 diabetes mellitus with other specified complication: Secondary | ICD-10-CM

## 2024-06-09 ENCOUNTER — Other Ambulatory Visit: Payer: Self-pay | Admitting: Family Medicine

## 2024-06-09 DIAGNOSIS — E669 Obesity, unspecified: Secondary | ICD-10-CM

## 2024-06-15 ENCOUNTER — Ambulatory Visit: Admitting: Family Medicine

## 2024-06-30 ENCOUNTER — Other Ambulatory Visit (HOSPITAL_COMMUNITY)
Admission: RE | Admit: 2024-06-30 | Discharge: 2024-06-30 | Disposition: A | Discharge status: Home / Self Care | Source: Ambulatory Visit | Attending: Obstetrics & Gynecology | Admitting: Obstetrics & Gynecology

## 2024-06-30 ENCOUNTER — Ambulatory Visit (INDEPENDENT_AMBULATORY_CARE_PROVIDER_SITE_OTHER): Admitting: Obstetrics & Gynecology

## 2024-06-30 ENCOUNTER — Encounter: Payer: Self-pay | Admitting: Obstetrics & Gynecology

## 2024-06-30 VITALS — Ht 67.0 in | Wt 235.0 lb

## 2024-06-30 DIAGNOSIS — R87810 Cervical high risk human papillomavirus (HPV) DNA test positive: Secondary | ICD-10-CM

## 2024-06-30 DIAGNOSIS — Z8742 Personal history of other diseases of the female genital tract: Secondary | ICD-10-CM

## 2024-06-30 DIAGNOSIS — Z01419 Encounter for gynecological examination (general) (routine) without abnormal findings: Secondary | ICD-10-CM | POA: Diagnosis not present

## 2024-06-30 DIAGNOSIS — Z1231 Encounter for screening mammogram for malignant neoplasm of breast: Secondary | ICD-10-CM

## 2024-06-30 NOTE — Progress Notes (Signed)
 GYNECOLOGY ANNUAL PREVENTATIVE CARE ENCOUNTER NOTE  History:    Mariah [name] is a 48 y.o. G0P0000 female here for a routine annual gynecologic exam.  Current complaints: none. Had small amount of bleeding for 3 days in September, but otherwise is stable on the Megace  40 mg daily that she is taking due to her history of simple endometrial hyperplasia without atypia.  Denies other abnormal vaginal bleeding, discharge, pelvic pain, problems with intercourse or other gynecologic concerns.  Gynecologic History No LMP recorded (lmp unknown). Patient is postmenopausal. Contraception: none Last Pap: 06/11/2023. Result was normal cytology with positive HRHPV Last Mammogram: 07/12/2023.  Result was normal Last Cologuard: 09/10/2022.  Result was normal  Obstetric History OB History  Gravida Para Term Preterm AB Living  0 0 0 0 0 0  SAB IAB Ectopic Multiple Live Births  0 0 0 0 0    Past Medical History:  Diagnosis Date   Abnormal Pap smear 09/26/10   ASCUS   Abnormal uterine bleeding    Asthma    Albuterol  Inhaler last used 7/12   Diabetes (HCC)    Diabetes mellitus without complication (HCC)    Hypercholesteremia    Morbid obesity (HCC)    Routine adult health maintenance 07/25/2022   Simple endometrial hyperplasia without atypia     Past Surgical History:  Procedure Laterality Date   APPENDECTOMY     CYSTECTOMY  1989   ENDOMETRIAL BIOPSY  02/13/11   AUB   HYSTEROSCOPY WITH D & C  04/16/2011   Procedure: DILATATION AND CURETTAGE (D&C) /HYSTEROSCOPY;  Surgeon: Gloris DELENA Hugger, MD;  Location: [facility];  Service: Gynecology;  Laterality: N/A;  Diagnostic   polyp removal  2010    Current Outpatient Medications on File Prior to Visit  Medication Sig Dispense Refill   atorvastatin  (LIPITOR) 80 MG tablet Take 1 tablet by mouth once daily 90 tablet 0   Continuous Glucose Sensor (DEXCOM G7 SENSOR) MISC 1 Stick by Does not apply route continuous as needed (every 15 days).      Dulaglutide  (TRULICITY ) 4.5 MG/0.5ML SOAJ Inject 4.5 mg into the skin once a week. 2 mL 3   glipiZIDE  (GLUCOTROL ) 10 MG tablet Take 1 tablet (10 mg total) by mouth 2 (two) times daily before a meal. 180 tablet 1   lisinopril  (ZESTRIL ) 2.5 MG tablet Take 1 tablet by mouth once daily 90 tablet 0   megestrol  (MEGACE ) 40 MG tablet TAKE 1 TABLET BY MOUTH ONCE DAILY -  CAN  INCREASE  TO  2  TABLETS  EVERY  DAY  IN  THE  EVENT  OF  HEAVY  BLEEDING 90 tablet 4   metFORMIN  (GLUCOPHAGE ) 1000 MG tablet TAKE 1 TABLET BY MOUTH TWICE DAILY WITH A MEAL 180 tablet 0   Multiple Vitamin (MULTIVITAMIN WITH MINERALS) TABS tablet Take 1 tablet by mouth daily.     Multiple Vitamins-Minerals (AIRBORNE PO) Take by mouth.     benzonatate  (TESSALON ) 200 MG capsule Take 1 capsule (200 mg total) by mouth 2 (two) times daily as needed for cough. (Patient not taking: Reported on 04/20/2024) 20 capsule 0   Continuous Glucose Sensor (FREESTYLE LIBRE 3 SENSOR) MISC 1 Application by Does not apply route every 14 (fourteen) days. Place 1 sensor on the skin every 14 days. Use to check glucose continuously (Patient not taking: Reported on 04/20/2024) 1 each 3   fluconazole  (DIFLUCAN ) 150 MG tablet Take 1 tablet (150 mg total) by mouth daily. Repeat in  24 hours if needed (Patient not taking: Reported on 04/20/2024) 2 tablet 2   HYDROcodone  bit-homatropine (HYCODAN) 5-1.5 MG/5ML syrup Take 5 mLs by mouth every 8 (eight) hours as needed for cough. (Patient not taking: Reported on 04/20/2024) 120 mL 0   nicotine  (NICODERM CQ  - DOSED IN MG/24 HOURS) 21 mg/24hr patch Place 1 patch (21 mg total) onto the skin daily. (Patient not taking: Reported on 04/20/2024) 28 patch 1   No current facility-administered medications on file prior to visit.    Allergies  Allergen Reactions   Farxiga  [Dapagliflozin ] Itching    Social History:  reports that she has been smoking cigarettes. She has a 14 pack-year smoking history. She has never used smokeless  tobacco. She reports current alcohol use. She reports that she does not use drugs.  Family History  Problem Relation Age of Onset   Diabetes Mother    Heart disease Mother    Hyperlipidemia Mother    Diabetes Maternal Grandmother    Diabetes Maternal Grandfather    Cancer Paternal Grandmother    Breast cancer Paternal Grandmother     The following portions of the patient's history were reviewed and updated as appropriate: allergies, current medications, past family history, past medical history, past social history, past surgical history and problem list.  Review of Systems Pertinent items noted in HPI and remainder of comprehensive ROS otherwise negative.  Physical Exam:  Ht 5' 7 (1.702 m)   Wt 235 lb (106.6 kg)   LMP  (LMP Unknown)   BMI 36.81 kg/m  CONSTITUTIONAL: Well-developed, well-nourished female in no acute distress.  HENT:  Normocephalic, atraumatic, External right and left ear normal.  EYES: Conjunctivae and EOM are normal. Pupils are equal, round, and reactive to light. No scleral icterus.  NECK: Normal range of motion, supple, no masses observed. SKIN: Skin is warm and dry. No rash noted. Not diaphoretic. No erythema. No pallor. MUSCULOSKELETAL: Normal range of motion. No tenderness.  No cyanosis, clubbing, or edema. NEUROLOGIC: Alert and oriented to person, place, and time. Normal muscle tone coordination.  PSYCHIATRIC: Normal mood and affect. Normal behavior. Normal judgment and thought content. CARDIOVASCULAR: Normal heart rate noted, regular rhythm RESPIRATORY: Clear to auscultation bilaterally. Effort and breath sounds normal, no problems with respiration noted. BREASTS: Symmetric in size. No masses, tenderness, skin changes, nipple drainage, or lymphadenopathy bilaterally. Performed in the presence of a chaperone. ABDOMEN: Soft, no distention noted.  No tenderness, rebound or guarding.  PELVIC: Normal appearing external genitalia and urethral meatus; normal  appearing vaginal mucosa and cervix.  No abnormal vaginal discharge noted.  Pap smear obtained.  Normal uterine size, no other palpable masses, no uterine or adnexal tenderness.  Performed in the presence of a chaperone.  Assessment and Plan:     1. History of simple endometrial hyperplasia without atypia Continue Megace  for now. If bleeding continues, may need repeat ultrasound and endometrial biopsy.  2. Breast cancer screening by mammogram Mammogram scheduled for breast cancer screening. - MM 3D SCREENING MAMMOGRAM BILATERAL BREAST; Future  3. Normal cytology on pap smear with positive HRHPV, but negative HPV 16 and 18/45 on 06/11/23 4. Well woman exam with routine gynecological exam (Primary) - Cytology - PAP Will follow up results of pap smear and manage accordingly. Colon cancer screening is up to date. Routine preventative health maintenance measures emphasized. Please refer to After Visit Summary for other counseling recommendations.      GLORIS HUGGER, MD, FACOG Obstetrician & Gynecologist, [facility], [facility]

## 2024-07-03 LAB — CYTOLOGY - PAP
Adequacy: ABSENT
Comment: NEGATIVE
Diagnosis: NEGATIVE
High risk HPV: NEGATIVE

## 2024-07-06 ENCOUNTER — Ambulatory Visit: Payer: Self-pay | Admitting: Obstetrics & Gynecology

## 2024-07-24 ENCOUNTER — Ambulatory Visit
Admission: RE | Admit: 2024-07-24 | Discharge: 2024-07-24 | Disposition: A | Discharge status: Home / Self Care | Source: Ambulatory Visit | Attending: Obstetrics & Gynecology | Admitting: Obstetrics & Gynecology

## 2024-07-24 DIAGNOSIS — Z1231 Encounter for screening mammogram for malignant neoplasm of breast: Secondary | ICD-10-CM

## 2024-08-08 ENCOUNTER — Other Ambulatory Visit: Payer: Self-pay | Admitting: Family Medicine

## 2024-08-08 DIAGNOSIS — E1169 Type 2 diabetes mellitus with other specified complication: Secondary | ICD-10-CM

## 2024-08-29 ENCOUNTER — Other Ambulatory Visit: Payer: Self-pay | Admitting: Family Medicine

## 2024-08-29 DIAGNOSIS — E119 Type 2 diabetes mellitus without complications: Secondary | ICD-10-CM

## 2024-08-31 ENCOUNTER — Other Ambulatory Visit: Payer: Self-pay | Admitting: Family Medicine

## 2024-08-31 DIAGNOSIS — E669 Obesity, unspecified: Secondary | ICD-10-CM

## 2024-10-13 ENCOUNTER — Other Ambulatory Visit

## 2024-10-13 DIAGNOSIS — Z0000 Encounter for general adult medical examination without abnormal findings: Secondary | ICD-10-CM

## 2024-10-20 ENCOUNTER — Encounter: Admitting: Family Medicine
